# Patient Record
Sex: Female | Born: 1940 | Race: White | Hispanic: No | Smoking: Never smoker
Health system: Southern US, Community
[De-identification: ages and names within clinical notes are randomized; demographics above are authoritative.]

## PROBLEM LIST (undated history)

## (undated) DIAGNOSIS — I499 Cardiac arrhythmia, unspecified: Secondary | ICD-10-CM

## (undated) DIAGNOSIS — K219 Gastro-esophageal reflux disease without esophagitis: Secondary | ICD-10-CM

## (undated) DIAGNOSIS — H269 Unspecified cataract: Secondary | ICD-10-CM

## (undated) DIAGNOSIS — Z860100 Personal history of colon polyps, unspecified: Secondary | ICD-10-CM

## (undated) DIAGNOSIS — T7840XA Allergy, unspecified, initial encounter: Secondary | ICD-10-CM

## (undated) DIAGNOSIS — F32A Depression, unspecified: Secondary | ICD-10-CM

## (undated) DIAGNOSIS — F419 Anxiety disorder, unspecified: Secondary | ICD-10-CM

## (undated) DIAGNOSIS — I10 Essential (primary) hypertension: Secondary | ICD-10-CM

## (undated) DIAGNOSIS — E785 Hyperlipidemia, unspecified: Secondary | ICD-10-CM

## (undated) HISTORY — DX: Unspecified cataract: H26.9

## (undated) HISTORY — PX: BARTHOLIN GLAND CYST EXCISION: SHX565

## (undated) HISTORY — DX: Personal history of colonic polyps: Z86.010

## (undated) HISTORY — DX: Essential (primary) hypertension: I10

## (undated) HISTORY — DX: Hyperlipidemia, unspecified: E78.5

## (undated) HISTORY — DX: Personal history of colon polyps, unspecified: Z86.0100

## (undated) HISTORY — PX: CHOLECYSTECTOMY: SHX55

## (undated) HISTORY — DX: Allergy, unspecified, initial encounter: T78.40XA

## (undated) HISTORY — DX: Gastro-esophageal reflux disease without esophagitis: K21.9

## (undated) HISTORY — DX: Anxiety disorder, unspecified: F41.9

## (undated) HISTORY — DX: Cardiac arrhythmia, unspecified: I49.9

## (undated) HISTORY — DX: Depression, unspecified: F32.A

---
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
## 1962-02-12 HISTORY — PX: NASAL POLYP SURGERY: SHX186

## 2004-06-09 ENCOUNTER — Emergency Department: Payer: Self-pay | Admitting: Emergency Medicine

## 2005-03-14 ENCOUNTER — Ambulatory Visit: Payer: Self-pay | Admitting: Internal Medicine

## 2006-04-01 ENCOUNTER — Ambulatory Visit: Payer: Self-pay | Admitting: Internal Medicine

## 2006-04-09 ENCOUNTER — Ambulatory Visit: Payer: Self-pay | Admitting: Internal Medicine

## 2006-11-04 ENCOUNTER — Ambulatory Visit: Payer: Self-pay | Admitting: Surgery

## 2007-04-22 ENCOUNTER — Ambulatory Visit: Payer: Self-pay | Admitting: Internal Medicine

## 2008-05-18 ENCOUNTER — Ambulatory Visit: Payer: Self-pay | Admitting: Internal Medicine

## 2008-11-02 ENCOUNTER — Ambulatory Visit: Payer: Self-pay | Admitting: Gastroenterology

## 2008-12-09 ENCOUNTER — Ambulatory Visit: Payer: Self-pay | Admitting: Internal Medicine

## 2009-09-20 ENCOUNTER — Ambulatory Visit: Payer: Self-pay | Admitting: Internal Medicine

## 2010-12-28 ENCOUNTER — Ambulatory Visit: Payer: Self-pay

## 2011-01-17 ENCOUNTER — Ambulatory Visit: Payer: Self-pay | Admitting: Internal Medicine

## 2011-08-13 LAB — HM MAMMOGRAPHY

## 2011-08-13 LAB — HM PAP SMEAR

## 2011-12-11 ENCOUNTER — Encounter: Payer: Self-pay | Admitting: Internal Medicine

## 2011-12-11 ENCOUNTER — Ambulatory Visit (INDEPENDENT_AMBULATORY_CARE_PROVIDER_SITE_OTHER): Payer: Medicare Other | Admitting: Internal Medicine

## 2011-12-11 ENCOUNTER — Telehealth: Payer: Self-pay | Admitting: Internal Medicine

## 2011-12-11 VITALS — BP 142/75 | HR 64 | Temp 98.2°F | Ht 64.0 in | Wt 190.0 lb

## 2011-12-11 DIAGNOSIS — I10 Essential (primary) hypertension: Secondary | ICD-10-CM

## 2011-12-11 MED ORDER — TELMISARTAN-HCTZ 80-12.5 MG PO TABS: 1.0000 | ORAL_TABLET | Freq: Every day | ORAL | Status: DC

## 2011-12-11 NOTE — Telephone Encounter (Signed)
Refill request for escitalopram 10 mg tablet Sig: take 1 tablet by mouth once a day

## 2011-12-11 NOTE — Patient Instructions (Addendum)
It was nice seeing you today.  I want you to monitor your blood pressure and send me some readings in several weeks.

## 2011-12-12 MED ORDER — ESCITALOPRAM OXALATE 10 MG PO TABS: 10.0000 mg | ORAL_TABLET | Freq: Every day | ORAL | Status: DC

## 2011-12-12 NOTE — Telephone Encounter (Signed)
Is it ok to refill? 

## 2011-12-12 NOTE — Telephone Encounter (Signed)
Notify pt i sent rx in today.  Let me know if problems.

## 2011-12-13 NOTE — Telephone Encounter (Signed)
Called patient and let her know.  

## 2011-12-17 ENCOUNTER — Encounter: Payer: Self-pay | Admitting: Internal Medicine

## 2011-12-17 DIAGNOSIS — I10 Essential (primary) hypertension: Secondary | ICD-10-CM | POA: Insufficient documentation

## 2011-12-17 NOTE — Assessment & Plan Note (Signed)
Blood pressure has been well controlled on Micardis/HCTZ.  BP is a little elevated today in the office.  Will have her spot check her pressure and record.  Send in readings over the next few weeks.  Adjust meds if necessary.

## 2011-12-17 NOTE — Progress Notes (Signed)
  Subjective:    Patient ID: Lori Guzman, female    DOB: Nov 18, 1940, 71 y.o.   MRN: [id number]  HPI 71 year old female with past history of hypertension who comes in today for a scheduled follow up.  She states she has been doing relatively well.  Feels she is handling stress relatively well.  Trying to stay active.  No chest pain or tightness with increased activity or exertion.  Bowels stable.  No abdominal pain or cramping.  Overall feels things are stable.   Past Medical History  Diagnosis Date  . Hypertension   . GERD (gastroesophageal reflux disease)     Review of Systems Patient denies any headache, lightheadedness or dizziness.  No sinus or allergy problems.   No chest pain, tightness or palpitations.  No increased shortness of breath, cough or congestion.  No increased acid reflux, dysphagia or odynophagis.   No nausea or vomiting.  No abdominal pain or cramping.  No bowel change, such as diarrhea, constipation, BRBPR or melana.  No urine change.        Objective:   Physical Exam Filed Vitals:   12/11/11 1613  BP: 142/75  Pulse: 64  Temp: 98.2 F (51.60 C)   71 year old female in no acute distress.   HEENT:  Nares - clear.  OP- without lesions or erythema.  NECK:  Supple, nontender.  No audible bruit.   HEART:  Appears to be regular. LUNGS:  Without crackles or wheezing audible.  Respirations even and unlabored.   RADIAL PULSE:  Equal bilaterally.  ABDOMEN:  Soft, nontender.  No audible abdominal bruit.   EXTREMITIES:  No increased edema to be present.                     Assessment & Plan:  INCREASED PSYCHOSOCIAL STRESSORS.  Doing well on the Lexapro.  Continue Lexapro 10mg  q day.  Follow.   FATIGUE.  Did report some, but overall appears to be doing well.  Will check cbc, met c and tsh with next labs.  GI.  Stomach and bowels appear to be doing better.  Follow.    HEALTH MAINTENANCE.  Will schedule her for a physical at next visit.  Obtain outside records for  review.  Keep on track with her mammograms, etc.

## 2012-01-25 ENCOUNTER — Telehealth: Payer: Self-pay | Admitting: Internal Medicine

## 2012-01-25 NOTE — Telephone Encounter (Signed)
Refill request for escitalopram 10 mg tablet Qty: 30 Sig: take 1 tablet by mouth once a day Patient has been seen

## 2012-01-25 NOTE — Telephone Encounter (Signed)
Per [facility] patient has 3 refills left for this script escitalopram 10 mg

## 2012-01-30 ENCOUNTER — Telehealth: Payer: Self-pay | Admitting: *Deleted

## 2012-01-30 NOTE — Telephone Encounter (Signed)
We can start filling.  Does she need it sent in now.  If so, ok to fill x 6.  Will need to know where to send rx

## 2012-01-30 NOTE — Telephone Encounter (Signed)
Called patient to let her know. Patient wants you to start filling her Micardis prescription

## 2012-01-30 NOTE — Telephone Encounter (Signed)
Call patient and let her know Bp readings are ok. Continue to monitor and send readings. Will give her another code for mychart.

## 2012-01-31 NOTE — Telephone Encounter (Signed)
Patient has enough for 1-2 weeks [facility] uses [facility] in [place].

## 2012-02-01 ENCOUNTER — Other Ambulatory Visit: Payer: Self-pay | Admitting: *Deleted

## 2012-02-01 MED ORDER — TELMISARTAN-HCTZ 80-12.5 MG PO TABS: 1.0000 | ORAL_TABLET | Freq: Every day | ORAL | Status: DC

## 2012-02-01 NOTE — Progress Notes (Signed)
Filled script for micardis 

## 2012-02-01 NOTE — Telephone Encounter (Signed)
Filled script for micardis

## 2012-02-29 ENCOUNTER — Telehealth: Payer: Self-pay | Admitting: Internal Medicine

## 2012-02-29 ENCOUNTER — Ambulatory Visit (INDEPENDENT_AMBULATORY_CARE_PROVIDER_SITE_OTHER): Payer: 59 | Admitting: Internal Medicine

## 2012-02-29 ENCOUNTER — Encounter: Payer: Self-pay | Admitting: Internal Medicine

## 2012-02-29 VITALS — BP 180/80 | HR 62 | Temp 98.2°F | Ht 64.0 in | Wt 189.8 lb

## 2012-02-29 DIAGNOSIS — Z139 Encounter for screening, unspecified: Secondary | ICD-10-CM

## 2012-02-29 DIAGNOSIS — I10 Essential (primary) hypertension: Secondary | ICD-10-CM

## 2012-02-29 DIAGNOSIS — Z23 Encounter for immunization: Secondary | ICD-10-CM

## 2012-02-29 LAB — BASIC METABOLIC PANEL
BUN: 19 mg/dL (ref 6–23)
CO2: 29 mEq/L (ref 19–32)
Chloride: 106 mEq/L (ref 96–112)
Glucose, Bld: 96 mg/dL (ref 70–99)
Potassium: 4.4 mEq/L (ref 3.5–5.3)

## 2012-02-29 MED ORDER — MOMETASONE FUROATE 50 MCG/ACT NA SUSP: 2.0000 | Freq: Every day | NASAL | Status: DC

## 2012-02-29 NOTE — Telephone Encounter (Signed)
They were going to give her what supply they had left and I thing they had 3 bottles.

## 2012-02-29 NOTE — Telephone Encounter (Signed)
Pt wanted to know if she could get a 90 day supply of the [employer]

## 2012-03-01 ENCOUNTER — Encounter: Payer: Self-pay | Admitting: Internal Medicine

## 2012-03-01 NOTE — Assessment & Plan Note (Signed)
Not taking the micardis/hctz as outlined.  Only on the micardis.  Called another pharmacy.  Pt to restart micardis/hctz 80/12.5 q day.  Follow.  Check metabolic panel.  Follow pressures.

## 2012-03-01 NOTE — Progress Notes (Signed)
  Subjective:    Patient ID: Lori Guzman, female    DOB: 11-14-1940, 72 y.o.   MRN: [id number]  HPI 72 year old female with past history of hypertension who comes in today for a scheduled follow up.  She states she has been doing relatively well.  Feels she is handling stress relatively well.  Trying to stay active.  No chest pain or tightness with increased activity or exertion.  Bowels stable.  Breathing stable.   No abdominal pain or cramping.  She has been out of her micardis/hctz for the last few weeks.  She states the pharmacy does not have it in stock.  She has been taking just micardis 80mg  q day from a previous rx.  Blood pressures have been averaging 130-140/70s.    Past Medical History  Diagnosis Date  . Hypertension   . GERD (gastroesophageal reflux disease)   . Allergy     reoccuring problems  . History of colon polyps     adenomatous    Current Outpatient Prescriptions on File Prior to Visit  Medication Sig Dispense Refill  . escitalopram (LEXAPRO) 10 MG tablet Take 1 tablet (10 mg total) by mouth daily.  30 tablet  5  . mometasone (NASONEX) 50 MCG/ACT nasal spray Place 2 sprays into the nose daily. As directed.  17 g  3  . telmisartan-hydrochlorothiazide (MICARDIS HCT) 80-12.5 MG per tablet Take 1 tablet by mouth daily.  30 tablet  6    Review of Systems Patient denies any headache, lightheadedness or dizziness.  No sinus or allergy problems.   No chest pain, tightness or palpitations.  No increased shortness of breath, cough or congestion.  No increased acid reflux, dysphagia or odynophagia.   No nausea or vomiting.  No abdominal pain or cramping.  No bowel change, such as diarrhea, constipation, BRBPR or melana.  No urine change.   Overall she feels she is doing well.      Objective:   Physical Exam  Filed Vitals:   02/29/12 1443  BP: 180/80  Pulse: 62  Temp: 98.2 F (36.8 C)   Blood pressure recheck:  26/78  72 year old female in no acute distress.     HEENT:  Nares - clear.  OP- without lesions or erythema.  NECK:  Supple, nontender.  No audible bruit.   HEART:  Appears to be regular. LUNGS:  Without crackles or wheezing audible.  Respirations even and unlabored.   RADIAL PULSE:  Equal bilaterally.  ABDOMEN:  Soft, nontender.  No audible abdominal bruit.   EXTREMITIES:  No increased edema to be present.                     Assessment & Plan:  INCREASED PSYCHOSOCIAL STRESSORS.  Doing well on the Lexapro.  Continue Lexapro 10mg  q day.  Follow.   GI.  Stomach and bowels appear to be doing better.  Follow.    HEALTH MAINTENANCE.  Will schedule her for a physical at next visit.  Keep on track with her mammograms, etc.  Schedule a mammogram.

## 2012-03-18 ENCOUNTER — Encounter: Payer: Self-pay | Admitting: Internal Medicine

## 2012-03-19 ENCOUNTER — Telehealth: Payer: Self-pay | Admitting: Internal Medicine

## 2012-03-19 NOTE — Telephone Encounter (Signed)
Pt was questioning when her mammogram - scheduled.  Let me know if I need to do anything.  Thanks.

## 2012-04-08 ENCOUNTER — Ambulatory Visit: Payer: Self-pay | Admitting: Internal Medicine

## 2012-04-09 ENCOUNTER — Encounter: Payer: Self-pay | Admitting: Internal Medicine

## 2012-04-24 ENCOUNTER — Encounter: Payer: Self-pay | Admitting: Internal Medicine

## 2012-06-03 ENCOUNTER — Telehealth: Payer: Self-pay | Admitting: Internal Medicine

## 2012-06-03 NOTE — Telephone Encounter (Signed)
Patient wants to know if she should come in before her physical to have her labs done.

## 2012-06-04 ENCOUNTER — Other Ambulatory Visit: Payer: Self-pay | Admitting: Internal Medicine

## 2012-06-04 DIAGNOSIS — I10 Essential (primary) hypertension: Secondary | ICD-10-CM

## 2012-06-04 DIAGNOSIS — Z13220 Encounter for screening for lipoid disorders: Secondary | ICD-10-CM

## 2012-06-04 NOTE — Progress Notes (Signed)
Orders placed for labs

## 2012-06-04 NOTE — Telephone Encounter (Signed)
Pt had wanted to get labs prior to her physical.  I have ordered labs.  Please schedule her for a fasting lab appt before her upcoming physical.  Thanks.

## 2012-06-05 NOTE — Telephone Encounter (Signed)
Pt decided to wait to do her labs

## 2012-06-05 NOTE — Telephone Encounter (Signed)
Left message for pt to call office

## 2012-06-09 ENCOUNTER — Other Ambulatory Visit (HOSPITAL_COMMUNITY)
Admission: RE | Admit: 2012-06-09 | Discharge: 2012-06-09 | Disposition: A | Discharge status: Home / Self Care | Payer: Medicare Other | Source: Ambulatory Visit | Attending: Internal Medicine | Admitting: Internal Medicine

## 2012-06-09 ENCOUNTER — Encounter: Payer: Self-pay | Admitting: Internal Medicine

## 2012-06-09 ENCOUNTER — Ambulatory Visit (INDEPENDENT_AMBULATORY_CARE_PROVIDER_SITE_OTHER): Payer: 59 | Admitting: Internal Medicine

## 2012-06-09 VITALS — BP 142/76 | HR 63 | Temp 98.0°F | Ht 64.5 in | Wt 189.0 lb

## 2012-06-09 DIAGNOSIS — I10 Essential (primary) hypertension: Secondary | ICD-10-CM

## 2012-06-09 DIAGNOSIS — K219 Gastro-esophageal reflux disease without esophagitis: Secondary | ICD-10-CM | POA: Insufficient documentation

## 2012-06-09 DIAGNOSIS — Z23 Encounter for immunization: Secondary | ICD-10-CM

## 2012-06-09 DIAGNOSIS — Z1151 Encounter for screening for human papillomavirus (HPV): Secondary | ICD-10-CM | POA: Insufficient documentation

## 2012-06-09 DIAGNOSIS — Z13220 Encounter for screening for lipoid disorders: Secondary | ICD-10-CM

## 2012-06-09 DIAGNOSIS — Z86010 Personal history of colonic polyps: Secondary | ICD-10-CM

## 2012-06-09 DIAGNOSIS — Z01419 Encounter for gynecological examination (general) (routine) without abnormal findings: Secondary | ICD-10-CM | POA: Insufficient documentation

## 2012-06-09 DIAGNOSIS — Z124 Encounter for screening for malignant neoplasm of cervix: Secondary | ICD-10-CM

## 2012-06-09 LAB — COMPREHENSIVE METABOLIC PANEL
AST: 19 U/L (ref 0–37)
Albumin: 4.3 g/dL (ref 3.5–5.2)
Alkaline Phosphatase: 51 U/L (ref 39–117)
BUN: 14 mg/dL (ref 6–23)
Potassium: 4.1 mEq/L (ref 3.5–5.1)
Total Bilirubin: 1.8 mg/dL — ABNORMAL HIGH (ref 0.3–1.2)

## 2012-06-09 LAB — LIPID PANEL
LDL Cholesterol: 106 mg/dL — ABNORMAL HIGH (ref 0–99)
Total CHOL/HDL Ratio: 3
VLDL: 12.4 mg/dL (ref 0.0–40.0)

## 2012-06-09 LAB — CBC WITH DIFFERENTIAL/PLATELET
Basophils Relative: 0.7 % (ref 0.0–3.0)
Eosinophils Absolute: 0.2 10*3/uL (ref 0.0–0.7)
Eosinophils Relative: 3.1 % (ref 0.0–5.0)
HCT: 37.5 % (ref 36.0–46.0)
Hemoglobin: 12.8 g/dL (ref 12.0–15.0)
Lymphs Abs: 2 10*3/uL (ref 0.7–4.0)
MCHC: 34.1 g/dL (ref 30.0–36.0)
MCV: 91.9 fl (ref 78.0–100.0)
Monocytes Absolute: 0.5 10*3/uL (ref 0.1–1.0)
Neutro Abs: 3 10*3/uL (ref 1.4–7.7)
Neutrophils Relative %: 53.1 % (ref 43.0–77.0)
RBC: 4.08 Mil/uL (ref 3.87–5.11)
WBC: 5.7 10*3/uL (ref 4.5–10.5)

## 2012-06-09 NOTE — Progress Notes (Signed)
  Subjective:    Patient ID: Lori Guzman, female    DOB: 1941-01-24, 72 y.o.   MRN: [id number]  HPI 72 year old female with past history of hypertension who comes in today to follow up on this issue as well as for a complete physical exam.  She states she has been doing relatively well.  Feels she is handling stress relatively well.  Trying to stay active.  No chest pain or tightness with increased activity or exertion.  Bowels stable.  Breathing stable.   No abdominal pain or cramping.      Past Medical History  Diagnosis Date  . Hypertension   . GERD (gastroesophageal reflux disease)   . Allergy     reoccuring problems  . History of colon polyps     adenomatous    Current Outpatient Prescriptions on File Prior to Visit  Medication Sig Dispense Refill  . aspirin EC 81 MG tablet Take 81 mg by mouth daily.      Marland Kitchen escitalopram (LEXAPRO) 10 MG tablet Take 1 tablet (10 mg total) by mouth daily.  30 tablet  5  . mometasone (NASONEX) 50 MCG/ACT nasal spray Place 2 sprays into the nose daily. As directed.  17 g  3  . telmisartan-hydrochlorothiazide (MICARDIS HCT) 80-12.5 MG per tablet Take 1 tablet by mouth daily.  30 tablet  6   No current facility-administered medications on file prior to visit.    Review of Systems Patient denies any headache, lightheadedness or dizziness.  No sinus or allergy problems.   No chest pain, tightness or palpitations.  No increased shortness of breath, cough or congestion.  No increased acid reflux, dysphagia or odynophagia.   No nausea or vomiting.  No abdominal pain or cramping.  No bowel change, such as diarrhea, constipation, BRBPR or melana.  No urine change.   Overall she feels she is doing well.  Increased stress.  They are selling their house.  Increased stress related to this.  Feels she is handling things relatively well.      Objective:   Physical Exam  Filed Vitals:   06/09/12 1036  BP: 142/76  Pulse: 63  Temp: 98 F (36.7 C)   Blood  pressure recheck:  78/65  72 year old female in no acute distress.   HEENT:  Nares- clear.  Oropharynx - without lesions. NECK:  Supple.  Nontender.  No audible bruit.  HEART:  Appears to be regular. LUNGS:  No crackles or wheezing audible.  Respirations even and unlabored.  RADIAL PULSE:  Equal bilaterally.    BREASTS:  No nipple discharge or nipple retraction present.  Could not appreciate any distinct nodules or axillary adenopathy.  ABDOMEN:  Soft, nontender.  Bowel sounds present and normal.  No audible abdominal bruit.  GU:  Normal external genitalia.  Vaginal vault without lesions.  Cervix identified.  Pap performed. Could not appreciate any adnexal masses or tenderness.   RECTAL:  Heme negative.   EXTREMITIES:  No increased edema present.  DP pulses palpable and equal bilaterally.             Assessment & Plan:  INCREASED PSYCHOSOCIAL STRESSORS.  Doing well on the Lexapro.  Continue Lexapro 10mg  q day.  Follow.   GI.  Stomach and bowels appear to be doing better.  Follow.    HEALTH MAINTENANCE.  Physical today.  Pelvic and pap performed.  Mammogram 04/08/12 - [facility]adsI.  Need results of colonoscopy.   Pneumovax given today.

## 2012-06-09 NOTE — Assessment & Plan Note (Signed)
History of adenomatous polyps.  Obtain records of last colonoscopy.  Continue follow up with GI.

## 2012-06-09 NOTE — Assessment & Plan Note (Signed)
Blood pressure is under good control on outside readings.  (110-120s/70-80).  A little elevated here today.  Have her to continue to spot check her pressure and record.  Notify me if elevation.  Check metabolic panel.

## 2012-06-09 NOTE — Assessment & Plan Note (Signed)
Symptoms under control.  Follow.   

## 2012-06-10 ENCOUNTER — Other Ambulatory Visit: Payer: Self-pay | Admitting: Internal Medicine

## 2012-06-10 ENCOUNTER — Ambulatory Visit: Payer: Medicare Other

## 2012-06-10 DIAGNOSIS — R17 Unspecified jaundice: Secondary | ICD-10-CM

## 2012-06-10 NOTE — Progress Notes (Signed)
Order placed for direct bili 

## 2012-06-11 ENCOUNTER — Other Ambulatory Visit: Payer: Self-pay | Admitting: Internal Medicine

## 2012-06-11 ENCOUNTER — Encounter: Payer: Self-pay | Admitting: Internal Medicine

## 2012-06-11 DIAGNOSIS — R739 Hyperglycemia, unspecified: Secondary | ICD-10-CM

## 2012-06-11 NOTE — Progress Notes (Signed)
Orders placed for f/u labs.  

## 2012-07-15 ENCOUNTER — Other Ambulatory Visit (INDEPENDENT_AMBULATORY_CARE_PROVIDER_SITE_OTHER): Payer: Medicare Other

## 2012-07-15 DIAGNOSIS — R17 Unspecified jaundice: Secondary | ICD-10-CM

## 2012-07-15 DIAGNOSIS — R7309 Other abnormal glucose: Secondary | ICD-10-CM

## 2012-07-15 DIAGNOSIS — R739 Hyperglycemia, unspecified: Secondary | ICD-10-CM

## 2012-07-15 DIAGNOSIS — I10 Essential (primary) hypertension: Secondary | ICD-10-CM

## 2012-07-15 LAB — HEPATIC FUNCTION PANEL
AST: 16 U/L (ref 0–37)
Albumin: 4 g/dL (ref 3.5–5.2)
Total Bilirubin: 1.5 mg/dL — ABNORMAL HIGH (ref 0.3–1.2)

## 2012-07-15 LAB — HEMOGLOBIN A1C: Hgb A1c MFr Bld: 6.4 % (ref 4.6–6.5)

## 2012-07-16 ENCOUNTER — Encounter: Payer: Self-pay | Admitting: Internal Medicine

## 2012-07-16 ENCOUNTER — Telehealth: Payer: Self-pay | Admitting: Internal Medicine

## 2012-07-16 NOTE — Telephone Encounter (Signed)
Pt notified of labs via my chart and the need for a f/u appt to discuss.  Please schedule her a follow up appt in the next couple of weeks (or 3 weeks if necessary).   Thanks.

## 2012-07-17 NOTE — Telephone Encounter (Signed)
Sent my chart message letting pt know when appointment is

## 2012-07-18 ENCOUNTER — Encounter: Payer: Self-pay | Admitting: Internal Medicine

## 2012-07-23 ENCOUNTER — Other Ambulatory Visit: Payer: Self-pay | Admitting: Internal Medicine

## 2012-08-12 ENCOUNTER — Ambulatory Visit (INDEPENDENT_AMBULATORY_CARE_PROVIDER_SITE_OTHER): Payer: Medicare Other | Admitting: Internal Medicine

## 2012-08-12 ENCOUNTER — Encounter: Payer: Self-pay | Admitting: Internal Medicine

## 2012-08-12 VITALS — BP 120/60 | HR 70 | Temp 97.8°F | Ht 64.5 in | Wt 187.8 lb

## 2012-08-12 DIAGNOSIS — R739 Hyperglycemia, unspecified: Secondary | ICD-10-CM

## 2012-08-12 DIAGNOSIS — Z86010 Personal history of colonic polyps: Secondary | ICD-10-CM

## 2012-08-12 DIAGNOSIS — R7309 Other abnormal glucose: Secondary | ICD-10-CM

## 2012-08-12 DIAGNOSIS — K219 Gastro-esophageal reflux disease without esophagitis: Secondary | ICD-10-CM

## 2012-08-12 DIAGNOSIS — I10 Essential (primary) hypertension: Secondary | ICD-10-CM

## 2012-08-15 ENCOUNTER — Encounter: Payer: Self-pay | Admitting: Internal Medicine

## 2012-08-15 DIAGNOSIS — E119 Type 2 diabetes mellitus without complications: Secondary | ICD-10-CM | POA: Insufficient documentation

## 2012-08-15 NOTE — Assessment & Plan Note (Addendum)
A1c just checked - 6.4.  Low carb diet and exercise.  Follow.  She was given a glucometer.  Instructed on proper way to check her sugar.

## 2012-08-15 NOTE — Progress Notes (Signed)
  Subjective:    Patient ID: Lori Guzman, female    DOB: 1940/05/08, 72 y.o.   MRN: [id number]  HPI 72 year old female with past history of hypertension who comes in today for a scheduled follow up.  She states she has been doing relatively well.  Feels she is handling stress relatively well.  Trying to stay active.  No chest pain or tightness with increased activity or exertion.  Bowels stable.  Breathing stable.   No abdominal pain or cramping.  Here to discuss her recent labs.  Her a1c was slightly elevated - 6.4.  We discussed diet and exercise.  Discussed low carb diet.      Past Medical History  Diagnosis Date  . Hypertension   . GERD (gastroesophageal reflux disease)   . Allergy     reoccuring problems  . History of colon polyps     adenomatous    Current Outpatient Prescriptions on File Prior to Visit  Medication Sig Dispense Refill  . aspirin EC 81 MG tablet Take 81 mg by mouth daily.      Marland Kitchen escitalopram (LEXAPRO) 10 MG tablet TAKE 1 TABLET (10 MG TOTAL) BY MOUTH DAILY.  30 tablet  2  . mometasone (NASONEX) 50 MCG/ACT nasal spray Place 2 sprays into the nose daily. As directed.  17 g  3  . telmisartan-hydrochlorothiazide (MICARDIS HCT) 80-12.5 MG per tablet Take 1 tablet by mouth daily.  30 tablet  6   No current facility-administered medications on file prior to visit.    Review of Systems Patient denies any headache, lightheadedness or dizziness.  No sinus or allergy problems.   No chest pain, tightness or palpitations.  No increased shortness of breath, cough or congestion.  No increased acid reflux, dysphagia or odynophagia.   No nausea or vomiting.  No abdominal pain or cramping.  No bowel change, such as diarrhea, constipation, BRBPR or melana.  No urine change.   Overall she feels she is doing well.  Increased stress.  Feels she is handling things relatively well. Discussed diet adjustment - low carb diet.  Discussed exercise.        Objective:   Physical  Exam  Filed Vitals:   08/12/12 0952  BP: 120/60  Pulse: 70  Temp: 97.8 F (30.3 C)   72 year old female in no acute distress.   HEENT:  Nares- clear.  Oropharynx - without lesions. NECK:  Supple.  Nontender.  No audible bruit.  HEART:  Appears to be regular. LUNGS:  No crackles or wheezing audible.  Respirations even and unlabored.  RADIAL PULSE:  Equal bilaterally.  ABDOMEN:  Soft, nontender.  Bowel sounds present and normal.  No audible abdominal bruit.  EXTREMITIES:  No increased edema present.  DP pulses palpable and equal bilaterally.             Assessment & Plan:  INCREASED PSYCHOSOCIAL STRESSORS.  Doing well on the Lexapro.  Continue Lexapro 10mg  q day.  Follow.   GI.  Stomach and bowels appear to be doing better.  Follow.    HEALTH MAINTENANCE.  Physical 06/09/12.   Mammogram 04/08/12 - BiradsI.  Colonoscopy 11/02/08 - diverticulosis.  Otherwise normal.  Recommend f/u colonoscopy in five years.

## 2012-08-15 NOTE — Assessment & Plan Note (Signed)
Blood pressure - good control.  Follow metabolic panel.  Same medication regimen.

## 2012-08-15 NOTE — Assessment & Plan Note (Signed)
History of adenomatous polyps.  Colonoscopy 11/02/08 - diverticulosis otherwise normal.  Recommend f/u colonoscopy in five years.    

## 2012-08-15 NOTE — Assessment & Plan Note (Signed)
Symptoms under control.  Follow.   

## 2012-09-12 ENCOUNTER — Other Ambulatory Visit: Payer: Self-pay | Admitting: *Deleted

## 2012-09-12 MED ORDER — TELMISARTAN-HCTZ 80-12.5 MG PO TABS: 1.0000 | ORAL_TABLET | Freq: Every day | ORAL | Status: DC

## 2012-09-12 MED ORDER — ESCITALOPRAM OXALATE 10 MG PO TABS: ORAL_TABLET | ORAL | Status: DC

## 2012-10-10 ENCOUNTER — Ambulatory Visit (INDEPENDENT_AMBULATORY_CARE_PROVIDER_SITE_OTHER): Payer: Medicare Other | Admitting: Internal Medicine

## 2012-10-10 ENCOUNTER — Encounter: Payer: Self-pay | Admitting: Internal Medicine

## 2012-10-10 VITALS — BP 130/60 | HR 66 | Temp 98.5°F | Ht 64.5 in | Wt 186.2 lb

## 2012-10-10 DIAGNOSIS — Z86010 Personal history of colonic polyps: Secondary | ICD-10-CM

## 2012-10-10 DIAGNOSIS — R17 Unspecified jaundice: Secondary | ICD-10-CM

## 2012-10-10 DIAGNOSIS — R739 Hyperglycemia, unspecified: Secondary | ICD-10-CM

## 2012-10-10 DIAGNOSIS — K219 Gastro-esophageal reflux disease without esophagitis: Secondary | ICD-10-CM

## 2012-10-10 DIAGNOSIS — I10 Essential (primary) hypertension: Secondary | ICD-10-CM

## 2012-10-10 DIAGNOSIS — R7309 Other abnormal glucose: Secondary | ICD-10-CM

## 2012-10-10 NOTE — Assessment & Plan Note (Addendum)
A1c last checked - 6.4.  Low carb diet and exercise.  Sugars as outlined.  Follow.

## 2012-10-10 NOTE — Assessment & Plan Note (Signed)
Symptoms under control.  Follow.   

## 2012-10-10 NOTE — Assessment & Plan Note (Addendum)
Blood pressure - good control.  Follow metabolic panel.  Same medication regimen.  Recheck by me today 142/78.   Follow.

## 2012-10-10 NOTE — Progress Notes (Signed)
  Subjective:    Patient ID: Lori Guzman, female    DOB: [birth year], 72 y.o.   MRN: [id number]  HPI 72 year old female with past history of hypertension who comes in today for a scheduled follow up.  She states she has been doing relatively well.  Feels she is handling stress relatively well.  Trying to stay active.  No chest pain or tightness with increased activity or exertion.  Bowels stable.  Breathing stable.   No abdominal pain or cramping.  Has been watching her diet and sugars recently.  Most sugars recently have been averaging 102-120s.      Past Medical History  Diagnosis Date  . Hypertension   . GERD (gastroesophageal reflux disease)   . Allergy     reoccuring problems  . History of colon polyps     adenomatous    Current Outpatient Prescriptions on File Prior to Visit  Medication Sig Dispense Refill  . aspirin EC 81 MG tablet Take 81 mg by mouth daily.      Marland Kitchen escitalopram (LEXAPRO) 10 MG tablet TAKE 1 TABLET (10 MG TOTAL) BY MOUTH DAILY.  30 tablet  2  . mometasone (NASONEX) 50 MCG/ACT nasal spray Place 2 sprays into the nose daily. As directed.  17 g  3  . telmisartan-hydrochlorothiazide (MICARDIS HCT) 80-12.5 MG per tablet Take 1 tablet by mouth daily.  30 tablet  6   No current facility-administered medications on file prior to visit.    Review of Systems Patient denies any headache, lightheadedness or dizziness.  No sinus or allergy problems.   No chest pain, tightness or palpitations.  No increased shortness of breath, cough or congestion.  No increased acid reflux, dysphagia or odynophagia.   No nausea or vomiting.  No abdominal pain or cramping.  No bowel change, such as diarrhea, constipation, BRBPR or melana.  No urine change.   Overall she feels she is doing well.  Increased stress.  Feels she is handling things relatively well.  Trying to adjust her diet and exercise.       Objective:   Physical Exam  Filed Vitals:   10/10/12 0908  BP: 130/60  Pulse: 66   Temp: 98.5 F (35.45 C)   72 year old female in no acute distress.   HEENT:  Nares- clear.  Oropharynx - without lesions. NECK:  Supple.  Nontender.  No audible bruit.  HEART:  Appears to be regular. LUNGS:  No crackles or wheezing audible.  Respirations even and unlabored.  RADIAL PULSE:  Equal bilaterally.  ABDOMEN:  Soft, nontender.  Bowel sounds present and normal.  No audible abdominal bruit.  EXTREMITIES:  No increased edema present.  DP pulses palpable and equal bilaterally.             Assessment & Plan:  INCREASED PSYCHOSOCIAL STRESSORS.  Doing well on the Lexapro.  Continue Lexapro 10mg  q day.  Follow.   GI.  Stomach and bowels appear to be doing better.  Follow.    HEALTH MAINTENANCE.  Physical 06/09/12.   Mammogram 04/08/12 - BiradsI.  Colonoscopy 11/02/08 - diverticulosis.  Otherwise normal.  Recommend f/u colonoscopy in five years.

## 2012-10-10 NOTE — Assessment & Plan Note (Signed)
History of adenomatous polyps.  Colonoscopy 11/02/08 - diverticulosis otherwise normal.  Recommend f/u colonoscopy in five years.

## 2012-10-13 ENCOUNTER — Encounter: Payer: Self-pay | Admitting: Internal Medicine

## 2012-12-18 ENCOUNTER — Other Ambulatory Visit: Payer: Self-pay

## 2013-01-15 ENCOUNTER — Other Ambulatory Visit (INDEPENDENT_AMBULATORY_CARE_PROVIDER_SITE_OTHER): Payer: Medicare Other

## 2013-01-15 DIAGNOSIS — I10 Essential (primary) hypertension: Secondary | ICD-10-CM

## 2013-01-15 DIAGNOSIS — R739 Hyperglycemia, unspecified: Secondary | ICD-10-CM

## 2013-01-15 DIAGNOSIS — R17 Unspecified jaundice: Secondary | ICD-10-CM

## 2013-01-15 DIAGNOSIS — R7309 Other abnormal glucose: Secondary | ICD-10-CM

## 2013-01-15 LAB — HEPATIC FUNCTION PANEL
Bilirubin, Direct: 0.2 mg/dL (ref 0.0–0.3)
Total Bilirubin: 1.3 mg/dL — ABNORMAL HIGH (ref 0.3–1.2)
Total Protein: 6.7 g/dL (ref 6.0–8.3)

## 2013-01-15 LAB — BASIC METABOLIC PANEL
CO2: 31 mEq/L (ref 19–32)
Calcium: 9.2 mg/dL (ref 8.4–10.5)
Creatinine, Ser: 0.8 mg/dL (ref 0.4–1.2)

## 2013-01-16 ENCOUNTER — Encounter: Payer: Self-pay | Admitting: Internal Medicine

## 2013-01-17 ENCOUNTER — Other Ambulatory Visit: Payer: Self-pay | Admitting: Internal Medicine

## 2013-01-19 ENCOUNTER — Encounter: Payer: Self-pay | Admitting: Internal Medicine

## 2013-01-19 ENCOUNTER — Ambulatory Visit (INDEPENDENT_AMBULATORY_CARE_PROVIDER_SITE_OTHER): Payer: Medicare Other | Admitting: Internal Medicine

## 2013-01-19 VITALS — BP 138/62 | HR 68 | Temp 98.2°F | Ht 64.5 in | Wt 187.5 lb

## 2013-01-19 DIAGNOSIS — E119 Type 2 diabetes mellitus without complications: Secondary | ICD-10-CM

## 2013-01-19 DIAGNOSIS — K219 Gastro-esophageal reflux disease without esophagitis: Secondary | ICD-10-CM

## 2013-01-19 DIAGNOSIS — M79604 Pain in right leg: Secondary | ICD-10-CM

## 2013-01-19 DIAGNOSIS — Z86010 Personal history of colonic polyps: Secondary | ICD-10-CM

## 2013-01-19 DIAGNOSIS — I10 Essential (primary) hypertension: Secondary | ICD-10-CM

## 2013-01-19 DIAGNOSIS — M79609 Pain in unspecified limb: Secondary | ICD-10-CM

## 2013-01-19 DIAGNOSIS — Z23 Encounter for immunization: Secondary | ICD-10-CM

## 2013-01-19 NOTE — Progress Notes (Signed)
Pre-visit discussion using our clinic review tool. No additional management support is needed unless otherwise documented below in the visit note.  

## 2013-01-19 NOTE — Telephone Encounter (Signed)
Pt has appointment today with Dr. Lorin Picket, will discuss results at visit.

## 2013-01-21 ENCOUNTER — Encounter: Payer: Self-pay | Admitting: Internal Medicine

## 2013-01-21 DIAGNOSIS — F439 Reaction to severe stress, unspecified: Secondary | ICD-10-CM | POA: Insufficient documentation

## 2013-01-21 NOTE — Assessment & Plan Note (Signed)
History of adenomatous polyps.  Colonoscopy 11/02/08 - diverticulosis otherwise normal.  Recommend f/u colonoscopy in five years.

## 2013-01-21 NOTE — Assessment & Plan Note (Signed)
Pain as outlined.  No pain with straight leg raise.  Discussed stretches.  Tylenol helps.  She desires no further w/up or evaluation at this time.  Follow.

## 2013-01-21 NOTE — Assessment & Plan Note (Signed)
Symptoms under control.  Follow.   

## 2013-01-21 NOTE — Assessment & Plan Note (Signed)
A1c just checked 6.5.  Low carb diet and exercise.  Keep up to date with eye checks.

## 2013-01-21 NOTE — Progress Notes (Signed)
Subjective:    Patient ID: Lori Guzman, female    DOB: 1941-02-01, 72 y.o.   MRN: [id number]  HPI 73 year old female with past history of hypertension who comes in today for a scheduled follow up.  She states she has been doing relatively well.  Feels she is handling stress relatively well.  Trying to stay active.  No chest pain or tightness with increased activity or exertion.  Bowels stable.  Breathing stable.   No abdominal pain or cramping.  Trying to watch her diet.  Discussed low carb diet.  She has had problems with pain down her right leg (occasionally into her calf).  started several weeks ago.  States that four years ago, she had a previous ruptured disc.  Seeing Dr Imogene Burn (for accupuncture).  Tylenol helps.  Discussed stretching.        Past Medical History  Diagnosis Date  . Hypertension   . GERD (gastroesophageal reflux disease)   . Allergy     reoccuring problems  . History of colon polyps     adenomatous    Current Outpatient Prescriptions on File Prior to Visit  Medication Sig Dispense Refill  . mometasone (NASONEX) 50 MCG/ACT nasal spray Place 2 sprays into the nose daily. As directed.  17 g  3  . telmisartan-hydrochlorothiazide (MICARDIS HCT) 80-12.5 MG per tablet Take 1 tablet by mouth daily.  30 tablet  6  . aspirin EC 81 MG tablet Take 81 mg by mouth daily.      . Blood Glucose Monitoring Suppl (CONTOUR NEXT EZ MONITOR) W/DEVICE KIT by Does not apply route.      Marland Kitchen escitalopram (LEXAPRO) 10 MG tablet TAKE 1 TABLET BY MOUTH EVERY DAY  30 tablet  2   No current facility-administered medications on file prior to visit.    Review of Systems Patient denies any headache, lightheadedness or dizziness.  No sinus or allergy problems.   No chest pain, tightness or palpitations.  No increased shortness of breath, cough or congestion.  No increased acid reflux, dysphagia or odynophagia.   No nausea or vomiting.  No abdominal pain or cramping.  No bowel change, such as  diarrhea, constipation, BRBPR or melana.  No urine change.   Overall she feels she is doing well.  Increased stress with husbands medical issues.  Feels she is handling things relatively well.  Trying to adjust her diet and exercise.  Right leg pain as outlined.      Objective:   Physical Exam  Filed Vitals:   01/19/13 1003  BP: 138/62  Pulse: 68  Temp: 98.2 F (36.8 C)   Blood pressure recheck:  75/64  72 year old female in no acute distress.   HEENT:  Nares- clear.  Oropharynx - without lesions. NECK:  Supple.  Nontender.  No audible bruit.  HEART:  Appears to be regular. LUNGS:  No crackles or wheezing audible.  Respirations even and unlabored.  RADIAL PULSE:  Equal bilaterally.  ABDOMEN:  Soft, nontender.  Bowel sounds present and normal.  No audible abdominal bruit.  EXTREMITIES:  No increased edema present.  DP pulses palpable and equal bilaterally.  FEET:  Without lesions.             Assessment & Plan:  INCREASED PSYCHOSOCIAL STRESSORS.  Doing well on the Lexapro.  Continue Lexapro 10mg  q day.  Follow.   GI.  Stomach and bowels appear to be doing better.  Follow.    HEALTH MAINTENANCE.  Physical  06/09/12.   Mammogram 04/08/12 - [facility]adsI.  Colonoscopy 11/02/08 - diverticulosis.  Otherwise normal.  Recommend f/u colonoscopy in five years.

## 2013-01-21 NOTE — Assessment & Plan Note (Signed)
Blood pressure - good control.  Follow metabolic panel.  Same medication regimen.  Follow.   

## 2013-04-30 ENCOUNTER — Other Ambulatory Visit: Payer: Self-pay | Admitting: Internal Medicine

## 2013-05-06 ENCOUNTER — Other Ambulatory Visit: Payer: Self-pay | Admitting: Internal Medicine

## 2013-05-12 ENCOUNTER — Telehealth: Payer: Self-pay | Admitting: Internal Medicine

## 2013-05-12 NOTE — Telephone Encounter (Signed)
Pt called to rs CPE 4/6.  Did not realize it was made so close to [place].  Appt rs to 6/1.  Pt asking if she can be seen any sooner for CPE, does not want to wait that long if at all possible.  States she will be out of town 4/6 and cannot make the previously scheduled appt.  Please advise.

## 2013-05-13 NOTE — Telephone Encounter (Signed)
If we do not have a meeting on 06/09/13 (8:00), I can put her in for a physical at that time.  We usually do not have meetings on Tuesday morning.  Please confirm with Larene Beach.  If no meeting, ok to schedule.  If meeting, can hold for cancellation.

## 2013-05-18 ENCOUNTER — Encounter: Payer: Medicare Other | Admitting: Internal Medicine

## 2013-05-26 NOTE — Telephone Encounter (Signed)
ok 

## 2013-05-26 NOTE — Telephone Encounter (Signed)
Spoke with pt she stated she was going out of town.  i put her on wait list

## 2013-05-26 NOTE — Telephone Encounter (Signed)
There is a meeting on 4/28  But there is an opening 4/17 @ 3 can i put her there?

## 2013-06-09 ENCOUNTER — Other Ambulatory Visit: Payer: Self-pay | Admitting: Internal Medicine

## 2013-06-19 ENCOUNTER — Telehealth: Payer: Self-pay | Admitting: Internal Medicine

## 2013-06-19 DIAGNOSIS — Z0000 Encounter for general adult medical examination without abnormal findings: Secondary | ICD-10-CM

## 2013-06-19 NOTE — Telephone Encounter (Signed)
Body check by the Dermatologist Roque Lias Phillip Heal. She would like to scheduled the same time as her husband.

## 2013-06-19 NOTE — Telephone Encounter (Signed)
Order placed for dermatology referral.  

## 2013-07-09 ENCOUNTER — Other Ambulatory Visit: Payer: Self-pay | Admitting: *Deleted

## 2013-07-09 MED ORDER — ESCITALOPRAM OXALATE 10 MG PO TABS: ORAL_TABLET | ORAL | Status: DC

## 2013-07-13 ENCOUNTER — Ambulatory Visit (INDEPENDENT_AMBULATORY_CARE_PROVIDER_SITE_OTHER): Payer: Medicare Other | Admitting: Internal Medicine

## 2013-07-13 ENCOUNTER — Encounter: Payer: Self-pay | Admitting: Internal Medicine

## 2013-07-13 VITALS — BP 130/60 | HR 65 | Temp 98.1°F | Ht 64.25 in | Wt 191.0 lb

## 2013-07-13 DIAGNOSIS — I10 Essential (primary) hypertension: Secondary | ICD-10-CM

## 2013-07-13 DIAGNOSIS — Z860100 Personal history of colon polyps, unspecified: Secondary | ICD-10-CM

## 2013-07-13 DIAGNOSIS — E119 Type 2 diabetes mellitus without complications: Secondary | ICD-10-CM

## 2013-07-13 DIAGNOSIS — K069 Disorder of gingiva and edentulous alveolar ridge, unspecified: Secondary | ICD-10-CM

## 2013-07-13 DIAGNOSIS — K219 Gastro-esophageal reflux disease without esophagitis: Secondary | ICD-10-CM

## 2013-07-13 DIAGNOSIS — F439 Reaction to severe stress, unspecified: Secondary | ICD-10-CM

## 2013-07-13 DIAGNOSIS — Z733 Stress, not elsewhere classified: Secondary | ICD-10-CM

## 2013-07-13 DIAGNOSIS — Z1239 Encounter for other screening for malignant neoplasm of breast: Secondary | ICD-10-CM

## 2013-07-13 DIAGNOSIS — K056 Periodontal disease, unspecified: Secondary | ICD-10-CM

## 2013-07-13 DIAGNOSIS — K068 Other specified disorders of gingiva and edentulous alveolar ridge: Secondary | ICD-10-CM

## 2013-07-13 MED ORDER — ESOMEPRAZOLE MAGNESIUM 40 MG PO CPDR: 40.0000 mg | DELAYED_RELEASE_CAPSULE | Freq: Every day | ORAL | Status: DC

## 2013-07-13 NOTE — Progress Notes (Signed)
Pre visit review using our clinic review tool, if applicable. No additional management support is needed unless otherwise documented below in the visit note. 

## 2013-07-19 ENCOUNTER — Encounter: Payer: Self-pay | Admitting: Internal Medicine

## 2013-07-19 DIAGNOSIS — K068 Other specified disorders of gingiva and edentulous alveolar ridge: Secondary | ICD-10-CM | POA: Insufficient documentation

## 2013-07-19 MED ORDER — ESOMEPRAZOLE MAGNESIUM 40 MG PO CPDR: 40.0000 mg | DELAYED_RELEASE_CAPSULE | Freq: Every day | ORAL | Status: DC

## 2013-07-19 NOTE — Assessment & Plan Note (Signed)
Symptoms under control.  Follow.   

## 2013-07-19 NOTE — Progress Notes (Signed)
Subjective:    Patient ID: Lori Guzman, female    DOB: 1940-10-19, 73 y.o.   MRN: [id number]  HPI 73 year old female with past history of hypertension who comes in today to follow up on this issue as well as for a complete physical exam.  She states she has been doing relatively well.  Feels she is handling stress relatively well.  Trying to stay active.  No chest pain or tightness with increased activity or exertion.  Bowels stable.  Breathing stable.   No abdominal pain or cramping.  Trying to watch her diet.  States blood pressure has been averaging 116/56-60.  (812 highest systolic reading).  Planning to have a biopsy on her left upper gum (Dr Kathyrn Sheriff - 07/24/13).         Past Medical History  Diagnosis Date  . Hypertension   . GERD (gastroesophageal reflux disease)   . Allergy     reoccuring problems  . History of colon polyps     adenomatous    Current Outpatient Prescriptions on File Prior to Visit  Medication Sig Dispense Refill  . aspirin EC 81 MG tablet Take 81 mg by mouth daily.      . Blood Glucose Monitoring Suppl (CONTOUR NEXT EZ MONITOR) W/DEVICE KIT by Does not apply route.      Marland Kitchen escitalopram (LEXAPRO) 10 MG tablet TAKE 1 TABLET BY MOUTH EVERY DAY  30 tablet  2  . mometasone (NASONEX) 50 MCG/ACT nasal spray Place 2 sprays into the nose daily. As directed.  17 g  3  . telmisartan-hydrochlorothiazide (MICARDIS HCT) 80-12.5 MG per tablet TAKE 1 TABLET BY MOUTH DAILY.  30 tablet  5   No current facility-administered medications on file prior to visit.    Review of Systems Patient denies any headache, lightheadedness or dizziness.  No sinus or allergy problems.   No chest pain, tightness or palpitations.  No increased shortness of breath, cough or congestion.  No increased acid reflux, dysphagia or odynophagia.   No nausea or vomiting.  No abdominal pain or cramping.  No bowel change, such as diarrhea, constipation, BRBPR or melana.  No urine change.   Overall she feels  she is doing well.  Increased stress with husbands medical issues.  Feels she is handling things relatively well.  Trying to adjust her diet and exercise.  Blood pressures as outlined.       Objective:   Physical Exam  Filed Vitals:   07/13/13 1505  BP: 130/60  Pulse: 65  Temp: 98.1 F (54.45 C)   73 year old female in no acute distress.   HEENT:  Nares- clear.  Oropharynx - without lesions. NECK:  Supple.  Nontender.  No audible bruit.  HEART:  Appears to be regular. LUNGS:  No crackles or wheezing audible.  Respirations even and unlabored.  RADIAL PULSE:  Equal bilaterally.    BREASTS:  No nipple discharge or nipple retraction present.  Could not appreciate any distinct nodules or axillary adenopathy.  ABDOMEN:  Soft, nontender.  Bowel sounds present and normal.  No audible abdominal bruit.  GU:  Not performed.     EXTREMITIES:  No increased edema present.  DP pulses palpable and equal bilaterally.      FEET:  No lesions.           Assessment & Plan:  GI.  Stomach and bowels appear to be doing better.  Follow.    HEALTH MAINTENANCE.  Physical today.   Mammogram  04/08/12 - Birads I.  Overdue.  Colonoscopy 11/02/08 - diverticulosis.  Otherwise normal.  Recommend f/u colonoscopy in five years.  Due this year.    I spent 25 minutes with the patient and more than 50% of the time was spent in consultation regarding the above.

## 2013-07-19 NOTE — Assessment & Plan Note (Signed)
A1c last checked 6.5 (12/14).  Due f/u.   Low carb diet and exercise.  Keep up to date with eye checks.

## 2013-07-19 NOTE — Assessment & Plan Note (Addendum)
History of adenomatous polyps.  Colonoscopy 11/02/08 - diverticulosis otherwise normal.  Recommend f/u colonoscopy in five years.  Due this year.

## 2013-07-19 NOTE — Assessment & Plan Note (Signed)
Planning for biopsy 07/24/13 - Dr Kathyrn Sheriff.

## 2013-07-19 NOTE — Assessment & Plan Note (Signed)
Blood pressure - good control.  Follow metabolic panel.  Same medication regimen.  Follow.

## 2013-07-19 NOTE — Assessment & Plan Note (Signed)
Increased stress with her husband's medical issues.  Feels she is coping relatively well.  Follow.   

## 2013-07-23 ENCOUNTER — Other Ambulatory Visit (INDEPENDENT_AMBULATORY_CARE_PROVIDER_SITE_OTHER): Payer: Medicare Other

## 2013-07-23 DIAGNOSIS — E119 Type 2 diabetes mellitus without complications: Secondary | ICD-10-CM

## 2013-07-23 DIAGNOSIS — M79609 Pain in unspecified limb: Secondary | ICD-10-CM

## 2013-07-23 DIAGNOSIS — M79604 Pain in right leg: Secondary | ICD-10-CM

## 2013-07-23 LAB — CBC WITH DIFFERENTIAL/PLATELET
Basophils Absolute: 0 10*3/uL (ref 0.0–0.1)
Basophils Relative: 0.4 % (ref 0.0–3.0)
EOS ABS: 0.2 10*3/uL (ref 0.0–0.7)
Eosinophils Relative: 3.2 % (ref 0.0–5.0)
HCT: 37.3 % (ref 36.0–46.0)
HEMOGLOBIN: 12.7 g/dL (ref 12.0–15.0)
LYMPHS PCT: 35 % (ref 12.0–46.0)
Lymphs Abs: 1.7 10*3/uL (ref 0.7–4.0)
MCHC: 34 g/dL (ref 30.0–36.0)
MCV: 91.5 fl (ref 78.0–100.0)
MONOS PCT: 8.3 % (ref 3.0–12.0)
Monocytes Absolute: 0.4 10*3/uL (ref 0.1–1.0)
NEUTROS ABS: 2.6 10*3/uL (ref 1.4–7.7)
NEUTROS PCT: 53.1 % (ref 43.0–77.0)
PLATELETS: 239 10*3/uL (ref 150.0–400.0)
RBC: 4.08 Mil/uL (ref 3.87–5.11)
RDW: 13.1 % (ref 11.5–15.5)
WBC: 4.9 10*3/uL (ref 4.0–10.5)

## 2013-07-23 LAB — COMPREHENSIVE METABOLIC PANEL
ALK PHOS: 51 U/L (ref 39–117)
ALT: 13 U/L (ref 0–35)
AST: 17 U/L (ref 0–37)
Albumin: 3.9 g/dL (ref 3.5–5.2)
BUN: 14 mg/dL (ref 6–23)
CALCIUM: 9.3 mg/dL (ref 8.4–10.5)
CHLORIDE: 105 meq/L (ref 96–112)
CO2: 31 mEq/L (ref 19–32)
CREATININE: 0.8 mg/dL (ref 0.4–1.2)
GFR: 70.83 mL/min (ref >60.00)
Glucose, Bld: 111 mg/dL — ABNORMAL HIGH (ref 70–99)
Potassium: 4.2 mEq/L (ref 3.5–5.1)
Sodium: 140 mEq/L (ref 135–145)
Total Bilirubin: 1.6 mg/dL — ABNORMAL HIGH (ref 0.2–1.2)
Total Protein: 6.7 g/dL (ref 6.0–8.3)

## 2013-07-23 LAB — LIPID PANEL
CHOL/HDL RATIO: 3
Cholesterol: 174 mg/dL (ref 0–200)
HDL: 52.8 mg/dL (ref >39.00)
LDL CALC: 109 mg/dL — AB (ref 0–99)
NONHDL: 121.2
Triglycerides: 61 mg/dL (ref 0.0–149.0)
VLDL: 12.2 mg/dL (ref 0.0–40.0)

## 2013-07-23 LAB — TSH: TSH: 2.09 u[IU]/mL (ref 0.35–4.50)

## 2013-07-23 LAB — HEMOGLOBIN A1C: HEMOGLOBIN A1C: 6.4 % (ref 4.6–6.5)

## 2013-07-24 ENCOUNTER — Other Ambulatory Visit: Payer: Self-pay | Admitting: Internal Medicine

## 2013-07-24 NOTE — Progress Notes (Signed)
Order placed for direct bili lab

## 2013-07-27 ENCOUNTER — Telehealth: Payer: Self-pay | Admitting: Internal Medicine

## 2013-07-27 ENCOUNTER — Encounter: Payer: Self-pay | Admitting: Internal Medicine

## 2013-07-27 NOTE — Telephone Encounter (Signed)
Order placed for f/u liver panel.  Pt was notified of lab results.  Needs a non fasting lab appt in 1-2 weeks.  Please schedule and contact her with an appt date and time.

## 2013-07-27 NOTE — Telephone Encounter (Signed)
Lab appt scheduled. Sent mychart with appt date and time.

## 2013-07-28 ENCOUNTER — Telehealth: Payer: Self-pay | Admitting: Internal Medicine

## 2013-07-28 ENCOUNTER — Other Ambulatory Visit: Payer: Self-pay | Admitting: Internal Medicine

## 2013-07-28 DIAGNOSIS — R109 Unspecified abdominal pain: Secondary | ICD-10-CM

## 2013-07-28 NOTE — Telephone Encounter (Signed)
See phone note.  Pt was to let me know if agreeable for ultrasound.  Since agreeable, I have placed order for referral.  See her phone message for preferred dates and times.    Becky, please forward message to [facility] (referral coordinator).   (I was not sure of her last name Jimmye Norman?).   Thanks.

## 2013-07-28 NOTE — Progress Notes (Signed)
Order placed for abdominal US

## 2013-07-28 NOTE — Telephone Encounter (Signed)
Received a mychart message from patient stating: "Dr Nicki Reaper wants me to schedule an abdominal ultrasound and my better time would be July 14th morning or July 16th morning as I have other appointments on the 15th. I could come on (June 24th or June 26th.) preferably morning. Just notify me time and place. Thanks Lori Guzman" Please advise. There are no orders or referrals for a ultrasound/msn

## 2013-07-29 NOTE — Telephone Encounter (Signed)
Mailed unread message to pt  

## 2013-08-05 ENCOUNTER — Ambulatory Visit: Payer: Self-pay | Admitting: Internal Medicine

## 2013-08-06 ENCOUNTER — Other Ambulatory Visit (INDEPENDENT_AMBULATORY_CARE_PROVIDER_SITE_OTHER): Payer: Medicare Other

## 2013-08-06 DIAGNOSIS — R17 Unspecified jaundice: Secondary | ICD-10-CM

## 2013-08-06 LAB — HEPATIC FUNCTION PANEL
ALBUMIN: 4.3 g/dL (ref 3.5–5.2)
ALT: 18 U/L (ref 0–35)
AST: 18 U/L (ref 0–37)
Alkaline Phosphatase: 49 U/L (ref 39–117)
BILIRUBIN TOTAL: 0.8 mg/dL (ref 0.2–1.2)
Bilirubin, Direct: 0.2 mg/dL (ref 0.0–0.3)
Total Protein: 7 g/dL (ref 6.0–8.3)

## 2013-08-06 LAB — BILIRUBIN, DIRECT: Bilirubin, Direct: 0.2 mg/dL (ref 0.0–0.3)

## 2013-08-07 ENCOUNTER — Encounter: Payer: Self-pay | Admitting: Internal Medicine

## 2013-08-10 ENCOUNTER — Encounter: Payer: Self-pay | Admitting: Internal Medicine

## 2013-08-10 ENCOUNTER — Telehealth: Payer: Self-pay | Admitting: Internal Medicine

## 2013-08-10 NOTE — Telephone Encounter (Signed)
Pt notified of ultrasound results via my chart.  To let me know if agreeable to GI referral.

## 2013-08-12 NOTE — Telephone Encounter (Signed)
Unread mychart message mailed to patient 

## 2013-10-22 ENCOUNTER — Encounter: Payer: Self-pay | Admitting: Internal Medicine

## 2013-11-06 ENCOUNTER — Other Ambulatory Visit: Payer: Self-pay | Admitting: Internal Medicine

## 2013-11-11 ENCOUNTER — Other Ambulatory Visit: Payer: Self-pay | Admitting: Internal Medicine

## 2013-11-17 ENCOUNTER — Ambulatory Visit: Payer: Medicare Other | Admitting: Internal Medicine

## 2013-11-30 ENCOUNTER — Ambulatory Visit (INDEPENDENT_AMBULATORY_CARE_PROVIDER_SITE_OTHER): Payer: Medicare Other | Admitting: Internal Medicine

## 2013-11-30 ENCOUNTER — Encounter: Payer: Self-pay | Admitting: Internal Medicine

## 2013-11-30 VITALS — BP 130/60 | HR 68 | Temp 98.2°F | Ht 64.25 in | Wt 188.5 lb

## 2013-11-30 DIAGNOSIS — Z860100 Personal history of colon polyps, unspecified: Secondary | ICD-10-CM

## 2013-11-30 DIAGNOSIS — I10 Essential (primary) hypertension: Secondary | ICD-10-CM

## 2013-11-30 DIAGNOSIS — E119 Type 2 diabetes mellitus without complications: Secondary | ICD-10-CM

## 2013-11-30 DIAGNOSIS — K219 Gastro-esophageal reflux disease without esophagitis: Secondary | ICD-10-CM

## 2013-11-30 DIAGNOSIS — Z658 Other specified problems related to psychosocial circumstances: Secondary | ICD-10-CM

## 2013-11-30 DIAGNOSIS — Z23 Encounter for immunization: Secondary | ICD-10-CM

## 2013-11-30 DIAGNOSIS — M79605 Pain in left leg: Secondary | ICD-10-CM

## 2013-11-30 DIAGNOSIS — K068 Other specified disorders of gingiva and edentulous alveolar ridge: Secondary | ICD-10-CM

## 2013-11-30 DIAGNOSIS — F439 Reaction to severe stress, unspecified: Secondary | ICD-10-CM

## 2013-11-30 NOTE — Progress Notes (Signed)
Pre visit review using our clinic review tool, if applicable. No additional management support is needed unless otherwise documented below in the visit note. 

## 2013-12-06 ENCOUNTER — Other Ambulatory Visit: Payer: Self-pay | Admitting: Internal Medicine

## 2013-12-06 DIAGNOSIS — I10 Essential (primary) hypertension: Secondary | ICD-10-CM

## 2013-12-06 DIAGNOSIS — E7800 Pure hypercholesterolemia, unspecified: Secondary | ICD-10-CM

## 2013-12-06 DIAGNOSIS — E119 Type 2 diabetes mellitus without complications: Secondary | ICD-10-CM

## 2013-12-06 DIAGNOSIS — M549 Dorsalgia, unspecified: Secondary | ICD-10-CM | POA: Insufficient documentation

## 2013-12-06 NOTE — Assessment & Plan Note (Signed)
Increased stress with her husband's medical issues.  Feels she is coping relatively well.  Follow.   

## 2013-12-06 NOTE — Assessment & Plan Note (Signed)
Symptoms under control.  Follow.   

## 2013-12-06 NOTE — Assessment & Plan Note (Addendum)
Blood pressure - good control.  Follow metabolic panel.  Same medication regimen.  Follow.   BP Readings from Last 3 Encounters:  11/30/13 130/60  07/13/13 130/60  01/19/13 138/62

## 2013-12-06 NOTE — Assessment & Plan Note (Signed)
Is s/p biopsy 07/24/13 - Dr Kathyrn Sheriff.  Benign.

## 2013-12-06 NOTE — Assessment & Plan Note (Signed)
Diagnosed with sciatica.  Better.  Follow.

## 2013-12-06 NOTE — Progress Notes (Signed)
Subjective:    Patient ID: Lori Guzman, female    DOB: 1940/06/26, 73 y.o.   MRN: [id number]  HPI 73 year old female with past history of hypertension who comes in today for a scheduled follow up  She states she has been doing relatively well.  Feels she is handling stress relatively well. Trying to stay active.  No chest pain or tightness with increased activity or exertion.  Bowels stable.  Eating activia and drinking some buttermilk.  Breathing stable.   No abdominal pain or cramping.  Trying to watch her diet.  States blood pressure has been doing well.  Issues with left leg pain.  Told had sciatica.       Past Medical History  Diagnosis Date  . Hypertension   . GERD (gastroesophageal reflux disease)   . Allergy     reoccuring problems  . History of colon polyps     adenomatous    Current Outpatient Prescriptions on File Prior to Visit  Medication Sig Dispense Refill  . aspirin EC 81 MG tablet Take 81 mg by mouth daily.      . Blood Glucose Monitoring Suppl (CONTOUR NEXT EZ MONITOR) W/DEVICE KIT by Does not apply route.      Marland Kitchen escitalopram (LEXAPRO) 10 MG tablet TAKE 1 TABLET BY MOUTH EVERY DAY  30 tablet  2  . esomeprazole (NEXIUM) 40 MG capsule Take 1 capsule (40 mg total) by mouth daily.  90 capsule  1  . mometasone (NASONEX) 50 MCG/ACT nasal spray Place 2 sprays into the nose daily. As directed.  17 g  3  . telmisartan-hydrochlorothiazide (MICARDIS HCT) 80-12.5 MG per tablet TAKE 1 TABLET BY MOUTH DAILY.  30 tablet  5   No current facility-administered medications on file prior to visit.    Review of Systems Patient denies any headache, lightheadedness or dizziness.  No sinus or allergy problems.   No chest pain, tightness or palpitations.  No increased shortness of breath, cough or congestion.  No increased acid reflux, dysphagia or odynophagia.   No nausea or vomiting.  No abdominal pain or cramping.  Bowels stable.  No urine change.   Overall she feels she is doing  well.  Increased stress with husbands medical issues.  Feels she is handling things relatively well.  Trying to adjust her diet and exercise.  Blood pressures have been doing well.  Left leg pain.  Told had sciatica.  Better.       Objective:   Physical Exam  Filed Vitals:   11/30/13 0950  BP: 130/60  Pulse: 68  Temp: 98.2 F (83.49 C)   73 year old female in no acute distress.   HEENT:  Nares- clear.  Oropharynx - without lesions. NECK:  Supple.  Nontender.  No audible bruit.  HEART:  Appears to be regular. LUNGS:  No crackles or wheezing audible.  Respirations even and unlabored.  RADIAL PULSE:  Equal bilaterally.   ABDOMEN:  Soft, nontender.  Bowel sounds present and normal.  No audible abdominal bruit.   EXTREMITIES:  No increased edema present.  DP pulses palpable and equal bilaterally.      FEET:  No lesions.           Assessment & Plan:  GI.  Stomach and bowels appear to be doing better.  Follow.    HEALTH MAINTENANCE.  Physical 07/13/13.   Mammogram 04/08/12 - Birads I.  Overdue.  Schedule.  Colonoscopy 11/02/08 - diverticulosis.  Otherwise normal.  Recommend f/u colonoscopy in five years.  Due this year.  She wants to wait until after first of the year.     Problem List Items Addressed This Visit   Diabetes      A1c last checked 6.4 (07/2013).   Low carb diet and exercise.  Keep up to date with eye checks.    Lab Results  Component Value Date   HGBA1C 6.4 07/23/2013      GERD (gastroesophageal reflux disease)     Symptoms under control.  Follow.      Gum lesion     Is s/p biopsy 07/24/13 - Dr Kathyrn Sheriff.  Benign.        History of colonic polyps     History of adenomatous polyps.  Colonoscopy 11/02/08 - diverticulosis otherwise normal.  Recommend f/u colonoscopy in five years.  Due this year.   Wants to schedule after first of the year.       Relevant Orders      Ambulatory referral to Gastroenterology   Hypertension      Blood pressure - good control.  Follow  metabolic panel.  Same medication regimen.  Follow.   BP Readings from Last 3 Encounters:  11/30/13 130/60  07/13/13 130/60  01/19/13 138/62       Left leg pain     Diagnosed with sciatica.  Better.  Follow.      Stress     Increased stress with her husband's medical issues.  Feels she is coping relatively well.  Follow.        Other Visit Diagnoses   Encounter for immunization    -  Primary      I spent 25 minutes with the patient and more than 50% of the time was spent in consultation regarding the above.

## 2013-12-06 NOTE — Assessment & Plan Note (Addendum)
A1c last checked 6.4 (07/2013).   Low carb diet and exercise.  Keep up to date with eye checks.    Lab Results  Component Value Date   HGBA1C 6.4 07/23/2013

## 2013-12-06 NOTE — Progress Notes (Signed)
Orders placed for labs

## 2013-12-06 NOTE — Assessment & Plan Note (Signed)
History of adenomatous polyps.  Colonoscopy 11/02/08 - diverticulosis otherwise normal.  Recommend f/u colonoscopy in five years.  Due this year.   Wants to schedule after first of the year.

## 2013-12-17 ENCOUNTER — Other Ambulatory Visit (INDEPENDENT_AMBULATORY_CARE_PROVIDER_SITE_OTHER): Payer: Medicare Other

## 2013-12-17 DIAGNOSIS — E7800 Pure hypercholesterolemia, unspecified: Secondary | ICD-10-CM

## 2013-12-17 DIAGNOSIS — I10 Essential (primary) hypertension: Secondary | ICD-10-CM

## 2013-12-17 DIAGNOSIS — E119 Type 2 diabetes mellitus without complications: Secondary | ICD-10-CM

## 2013-12-17 LAB — BASIC METABOLIC PANEL
BUN: 14 mg/dL (ref 6–23)
CHLORIDE: 105 meq/L (ref 96–112)
CO2: 30 mEq/L (ref 19–32)
Calcium: 9.4 mg/dL (ref 8.4–10.5)
Creatinine, Ser: 0.8 mg/dL (ref 0.4–1.2)
GFR: 72.74 mL/min (ref >60.00)
Glucose, Bld: 102 mg/dL — ABNORMAL HIGH (ref 70–99)
POTASSIUM: 4.5 meq/L (ref 3.5–5.1)
Sodium: 142 mEq/L (ref 135–145)

## 2013-12-17 LAB — HEPATIC FUNCTION PANEL
ALBUMIN: 3.6 g/dL (ref 3.5–5.2)
ALT: 16 U/L (ref 0–35)
AST: 16 U/L (ref 0–37)
Alkaline Phosphatase: 49 U/L (ref 39–117)
Bilirubin, Direct: 0.1 mg/dL (ref 0.0–0.3)
TOTAL PROTEIN: 7 g/dL (ref 6.0–8.3)
Total Bilirubin: 1.1 mg/dL (ref 0.2–1.2)

## 2013-12-17 LAB — LIPID PANEL
CHOL/HDL RATIO: 4
Cholesterol: 186 mg/dL (ref 0–200)
HDL: 52.9 mg/dL (ref >39.00)
LDL Cholesterol: 118 mg/dL — ABNORMAL HIGH (ref 0–99)
NONHDL: 133.1
TRIGLYCERIDES: 75 mg/dL (ref 0.0–149.0)
VLDL: 15 mg/dL (ref 0.0–40.0)

## 2013-12-17 LAB — MICROALBUMIN / CREATININE URINE RATIO
Creatinine,U: 145.3 mg/dL
MICROALB UR: 1.3 mg/dL (ref 0.0–1.9)
MICROALB/CREAT RATIO: 0.9 mg/g (ref 0.0–30.0)

## 2013-12-17 LAB — HEMOGLOBIN A1C: Hgb A1c MFr Bld: 6.4 % (ref 4.6–6.5)

## 2013-12-18 ENCOUNTER — Encounter: Payer: Self-pay | Admitting: Internal Medicine

## 2014-01-19 ENCOUNTER — Ambulatory Visit: Payer: Self-pay | Admitting: Physician Assistant

## 2014-01-19 ENCOUNTER — Encounter: Payer: Self-pay | Admitting: *Deleted

## 2014-01-19 LAB — URINALYSIS, COMPLETE
BILIRUBIN, UR: NEGATIVE
BLOOD: NEGATIVE
Bacteria: NEGATIVE
GLUCOSE, UR: NEGATIVE
KETONE: NEGATIVE
Leukocyte Esterase: NEGATIVE
NITRITE: NEGATIVE
PH: 7.5 (ref 5.0–8.0)
Protein: NEGATIVE
Specific Gravity: 1.01 (ref 1.000–1.030)

## 2014-01-21 LAB — URINE CULTURE

## 2014-02-02 ENCOUNTER — Ambulatory Visit: Payer: Self-pay | Admitting: Internal Medicine

## 2014-02-02 LAB — HM MAMMOGRAPHY: HM MAMMO: NEGATIVE

## 2014-02-03 ENCOUNTER — Encounter: Payer: Self-pay | Admitting: *Deleted

## 2014-02-06 ENCOUNTER — Other Ambulatory Visit: Payer: Self-pay | Admitting: Internal Medicine

## 2014-03-09 ENCOUNTER — Encounter: Payer: Self-pay | Admitting: Internal Medicine

## 2014-04-02 ENCOUNTER — Encounter: Payer: Self-pay | Admitting: Internal Medicine

## 2014-04-02 ENCOUNTER — Ambulatory Visit (INDEPENDENT_AMBULATORY_CARE_PROVIDER_SITE_OTHER): Payer: Medicare Other | Admitting: Internal Medicine

## 2014-04-02 VITALS — BP 140/60 | HR 69 | Temp 98.1°F | Ht 64.25 in | Wt 187.5 lb

## 2014-04-02 DIAGNOSIS — Z658 Other specified problems related to psychosocial circumstances: Secondary | ICD-10-CM

## 2014-04-02 DIAGNOSIS — I10 Essential (primary) hypertension: Secondary | ICD-10-CM

## 2014-04-02 DIAGNOSIS — E7800 Pure hypercholesterolemia, unspecified: Secondary | ICD-10-CM

## 2014-04-02 DIAGNOSIS — Z86010 Personal history of colonic polyps: Secondary | ICD-10-CM

## 2014-04-02 DIAGNOSIS — Z0000 Encounter for general adult medical examination without abnormal findings: Secondary | ICD-10-CM

## 2014-04-02 DIAGNOSIS — F439 Reaction to severe stress, unspecified: Secondary | ICD-10-CM

## 2014-04-02 DIAGNOSIS — E669 Obesity, unspecified: Secondary | ICD-10-CM

## 2014-04-02 DIAGNOSIS — E119 Type 2 diabetes mellitus without complications: Secondary | ICD-10-CM

## 2014-04-02 DIAGNOSIS — K219 Gastro-esophageal reflux disease without esophagitis: Secondary | ICD-10-CM

## 2014-04-02 NOTE — Progress Notes (Signed)
Pre visit review using our clinic review tool, if applicable. No additional management support is needed unless otherwise documented below in the visit note. 

## 2014-04-05 ENCOUNTER — Encounter: Payer: Self-pay | Admitting: Internal Medicine

## 2014-04-05 DIAGNOSIS — Z0000 Encounter for general adult medical examination without abnormal findings: Secondary | ICD-10-CM | POA: Insufficient documentation

## 2014-04-05 DIAGNOSIS — Z6831 Body mass index (BMI) 31.0-31.9, adult: Secondary | ICD-10-CM | POA: Insufficient documentation

## 2014-04-05 NOTE — Assessment & Plan Note (Signed)
History of adenomatous polyps.  Colonoscopy 11/02/08 - diverticulosis otherwise normal.  Recommended f/u colonoscopy in five years.  She is scheduled 04/06/14 for colonoscopy.

## 2014-04-05 NOTE — Assessment & Plan Note (Signed)
Low carb diet and exercise.  Follow met b and a1c.   Lab Results  Component Value Date   HGBA1C 6.4 12/17/2013

## 2014-04-05 NOTE — Assessment & Plan Note (Signed)
Feels she is handling things well.  Follow.    

## 2014-04-05 NOTE — Assessment & Plan Note (Signed)
Controlled.  

## 2014-04-05 NOTE — Assessment & Plan Note (Signed)
Low cholesterol diet and exercise.  Follow lipid panel.   

## 2014-04-05 NOTE — Assessment & Plan Note (Signed)
Blood pressure well controlled on current regimen.  Follow pressures.  Follow met b.

## 2014-04-05 NOTE — Assessment & Plan Note (Signed)
Physical 6//15.  Mammogram 02/02/14 - Birads I.  Colonoscopy scheduled 04/06/14.

## 2014-04-05 NOTE — Progress Notes (Signed)
Patient ID: Lori Guzman, female   DOB: Sep 02, 1940, 74 y.o.   MRN: [id number]   Subjective:    Patient ID: Lori Guzman, female    DOB: [birth year], 74 y.o.   MRN: [id number]  HPI  Patient here for a scheduled follow up.  Tries to stay active.  No cardiac symptoms with increased activity and exertion.  Breathing stable.  Bowels stable.  Stomach doing well.  Planning for colonoscopy 04/06/14.  Blood pressure doing well.     Past Medical History  Diagnosis Date  . Hypertension   . GERD (gastroesophageal reflux disease)   . Allergy     reoccuring problems  . History of colon polyps     adenomatous    Current Outpatient Prescriptions on File Prior to Visit  Medication Sig Dispense Refill  . aspirin EC 81 MG tablet Take 81 mg by mouth daily.    . Blood Glucose Monitoring Suppl (CONTOUR NEXT EZ MONITOR) W/DEVICE KIT by Does not apply route.    Marland Kitchen escitalopram (LEXAPRO) 10 MG tablet TAKE 1 TABLET BY MOUTH EVERY DAY 30 tablet 2  . mometasone (NASONEX) 50 MCG/ACT nasal spray Place 2 sprays into the nose daily. As directed. (Patient not taking: Reported on 04/02/2014) 17 g 3  . telmisartan-hydrochlorothiazide (MICARDIS HCT) 80-12.5 MG per tablet TAKE 1 TABLET BY MOUTH DAILY. 30 tablet 5   No current facility-administered medications on file prior to visit.    Review of Systems  Constitutional: Negative for appetite change and unexpected weight change.  HENT: Negative for congestion and sinus pressure.   Respiratory: Negative for cough, chest tightness and shortness of breath.   Cardiovascular: Negative for chest pain, palpitations and leg swelling.  Gastrointestinal: Negative for nausea, vomiting, abdominal pain and diarrhea.  Genitourinary: Negative for dysuria and difficulty urinating.  Neurological: Negative for dizziness, light-headedness and headaches.  Psychiatric/Behavioral:       Handling stress well.         Objective:     Blood pressure recheck:   128/64  Physical Exam  Constitutional: She appears well-developed and well-nourished. No distress.  HENT:  Nose: Nose normal.  Mouth/Throat: Oropharynx is clear and moist.  Neck: Neck supple. No thyromegaly present.  Cardiovascular: Normal rate and regular rhythm.   Pulmonary/Chest: Breath sounds normal. No respiratory distress. She has no wheezes.  Abdominal: Soft. Bowel sounds are normal. There is no tenderness.  Musculoskeletal: She exhibits no edema or tenderness.  Lymphadenopathy:    She has no cervical adenopathy.    BP 140/60 mmHg  Pulse 69  Temp(Src) 98.1 F (36.7 C) (Oral)  Ht 5' 4.25" (1.632 m)  Wt 187 lb 8 oz (85.049 kg)  BMI 31.93 kg/m2  SpO2 98%  LMP 02/29/1992 Wt Readings from Last 3 Encounters:  04/02/14 187 lb 8 oz (85.049 kg)  11/30/13 188 lb 8 oz (85.503 kg)  07/13/13 191 lb (86.637 kg)     Lab Results  Component Value Date   WBC 4.9 07/23/2013   HGB 12.7 07/23/2013   HCT 37.3 07/23/2013   PLT 239.0 07/23/2013   GLUCOSE 102* 12/17/2013   CHOL 186 12/17/2013   TRIG 75.0 12/17/2013   HDL 52.90 12/17/2013   LDLCALC 118* 12/17/2013   ALT 16 12/17/2013   AST 16 12/17/2013   NA 142 12/17/2013   K 4.5 12/17/2013   CL 105 12/17/2013   CREATININE 0.8 12/17/2013   BUN 14 12/17/2013   CO2 30 12/17/2013   TSH 2.09 07/23/2013  HGBA1C 6.4 12/17/2013   MICROALBUR 1.3 12/17/2013       Assessment & Plan:   Problem List Items Addressed This Visit    Diabetes    Low carb diet and exercise.  Follow met b and a1c.   Lab Results  Component Value Date   HGBA1C 6.4 12/17/2013        Relevant Orders   Hemoglobin A1c   GERD (gastroesophageal reflux disease)    Controlled.        Health care maintenance    Physical 6//15.  Mammogram 02/02/14 - Birads I.  Colonoscopy scheduled 04/06/14.        History of colonic polyps    History of adenomatous polyps.  Colonoscopy 11/02/08 - diverticulosis otherwise normal.  Recommended f/u colonoscopy in five  years.  She is scheduled 04/06/14 for colonoscopy.        Hypercholesterolemia    Low cholesterol diet and exercise.  Follow lipid panel.        Relevant Orders   Lipid panel   Hepatic function panel   Hypertension - Primary    Blood pressure well controlled on current regimen.  Follow pressures.  Follow met b.       Relevant Orders   Basic metabolic panel   Obesity (BMI 30-39.9)    Diet and exercise.        Stress    Feels she is handling things well.  Follow.            Einar Pheasant, MD

## 2014-04-05 NOTE — Assessment & Plan Note (Signed)
Diet and exercise.   

## 2014-04-06 ENCOUNTER — Ambulatory Visit: Payer: Self-pay | Admitting: Gastroenterology

## 2014-04-06 LAB — HM COLONOSCOPY: HM Colonoscopy: NORMAL

## 2014-04-19 ENCOUNTER — Encounter: Payer: Self-pay | Admitting: Internal Medicine

## 2014-05-03 ENCOUNTER — Other Ambulatory Visit: Payer: Medicare Other

## 2014-05-25 ENCOUNTER — Other Ambulatory Visit: Payer: Medicare Other

## 2014-05-31 ENCOUNTER — Other Ambulatory Visit: Payer: Self-pay | Admitting: Internal Medicine

## 2014-06-08 ENCOUNTER — Other Ambulatory Visit (INDEPENDENT_AMBULATORY_CARE_PROVIDER_SITE_OTHER): Payer: Medicare Other

## 2014-06-08 DIAGNOSIS — I10 Essential (primary) hypertension: Secondary | ICD-10-CM | POA: Diagnosis not present

## 2014-06-08 DIAGNOSIS — E7800 Pure hypercholesterolemia, unspecified: Secondary | ICD-10-CM

## 2014-06-08 DIAGNOSIS — E119 Type 2 diabetes mellitus without complications: Secondary | ICD-10-CM

## 2014-06-08 LAB — HEPATIC FUNCTION PANEL
ALBUMIN: 4.2 g/dL (ref 3.5–5.2)
ALK PHOS: 57 U/L (ref 39–117)
ALT: 14 U/L (ref 0–35)
AST: 17 U/L (ref 0–37)
BILIRUBIN DIRECT: 0.1 mg/dL (ref 0.0–0.3)
BILIRUBIN TOTAL: 1.2 mg/dL (ref 0.2–1.2)
TOTAL PROTEIN: 6.6 g/dL (ref 6.0–8.3)

## 2014-06-08 LAB — LIPID PANEL
Cholesterol: 161 mg/dL (ref 0–200)
HDL: 53.2 mg/dL (ref >39.00)
LDL Cholesterol: 91 mg/dL (ref 0–99)
NonHDL: 107.8
Total CHOL/HDL Ratio: 3
Triglycerides: 82 mg/dL (ref 0.0–149.0)
VLDL: 16.4 mg/dL (ref 0.0–40.0)

## 2014-06-08 LAB — BASIC METABOLIC PANEL
BUN: 18 mg/dL (ref 6–23)
CHLORIDE: 105 meq/L (ref 96–112)
CO2: 31 mEq/L (ref 19–32)
Calcium: 9.5 mg/dL (ref 8.4–10.5)
Creatinine, Ser: 0.86 mg/dL (ref 0.40–1.20)
GFR: 68.76 mL/min (ref >60.00)
GLUCOSE: 101 mg/dL — AB (ref 70–99)
Potassium: 4.3 mEq/L (ref 3.5–5.1)
Sodium: 141 mEq/L (ref 135–145)

## 2014-06-08 LAB — HEMOGLOBIN A1C: Hgb A1c MFr Bld: 6.4 % (ref 4.6–6.5)

## 2014-06-09 ENCOUNTER — Encounter: Payer: Self-pay | Admitting: Internal Medicine

## 2014-08-09 ENCOUNTER — Ambulatory Visit (INDEPENDENT_AMBULATORY_CARE_PROVIDER_SITE_OTHER): Payer: Medicare Other | Admitting: Internal Medicine

## 2014-08-09 ENCOUNTER — Encounter: Payer: Self-pay | Admitting: Internal Medicine

## 2014-08-09 VITALS — BP 138/68 | HR 63 | Temp 98.1°F | Ht 64.25 in | Wt 192.0 lb

## 2014-08-09 DIAGNOSIS — Z658 Other specified problems related to psychosocial circumstances: Secondary | ICD-10-CM

## 2014-08-09 DIAGNOSIS — Z23 Encounter for immunization: Secondary | ICD-10-CM | POA: Diagnosis not present

## 2014-08-09 DIAGNOSIS — E119 Type 2 diabetes mellitus without complications: Secondary | ICD-10-CM | POA: Diagnosis not present

## 2014-08-09 DIAGNOSIS — K219 Gastro-esophageal reflux disease without esophagitis: Secondary | ICD-10-CM

## 2014-08-09 DIAGNOSIS — I10 Essential (primary) hypertension: Secondary | ICD-10-CM | POA: Diagnosis not present

## 2014-08-09 DIAGNOSIS — E669 Obesity, unspecified: Secondary | ICD-10-CM

## 2014-08-09 DIAGNOSIS — Z86010 Personal history of colonic polyps: Secondary | ICD-10-CM

## 2014-08-09 DIAGNOSIS — F439 Reaction to severe stress, unspecified: Secondary | ICD-10-CM

## 2014-08-09 DIAGNOSIS — R42 Dizziness and giddiness: Secondary | ICD-10-CM

## 2014-08-09 DIAGNOSIS — E7800 Pure hypercholesterolemia, unspecified: Secondary | ICD-10-CM

## 2014-08-09 DIAGNOSIS — Z0000 Encounter for general adult medical examination without abnormal findings: Secondary | ICD-10-CM

## 2014-08-09 NOTE — Progress Notes (Signed)
Patient ID: Lori Guzman, female   DOB: [birth year], 74 y.o.   MRN: [id number]   Subjective:    Patient ID: Zemirah Krasinski, female    DOB: 1940-09-04, 74 y.o.   MRN: [id number]  HPI  Patient here to follow up on her current medical issues as well as for a complete physical exam.   Some increased stress with her husband's and her granddaughter's medical issues.  She does not feel she needs anything more at this time.  No cardiac symptoms with increased activity or exertion.  Breathing stable.  Occasionally will notice some light headedness.  Very brief.  May only last a second.  Not daily.  No persistent dizziness.  No persistent light headedness.  No headache.  Does not stay hydrated.  Bowels stable.     Past Medical History  Diagnosis Date  . Hypertension   . GERD (gastroesophageal reflux disease)   . Allergy     reoccuring problems  . History of colon polyps     adenomatous    Current Outpatient Prescriptions on File Prior to Visit  Medication Sig Dispense Refill  . aspirin EC 81 MG tablet Take 81 mg by mouth daily.    Marland Kitchen escitalopram (LEXAPRO) 10 MG tablet TAKE 1 TABLET BY MOUTH EVERY DAY 30 tablet 2  . telmisartan-hydrochlorothiazide (MICARDIS HCT) 80-12.5 MG per tablet TAKE 1 TABLET BY MOUTH EVERY DAY 30 tablet 2   No current facility-administered medications on file prior to visit.    Review of Systems  Constitutional: Negative for appetite change and unexpected weight change.  HENT: Negative for congestion and sinus pressure.   Eyes: Negative for pain and visual disturbance.  Respiratory: Negative for cough, chest tightness and shortness of breath.   Cardiovascular: Negative for chest pain, palpitations and leg swelling.  Gastrointestinal: Negative for nausea, vomiting, abdominal pain and diarrhea.  Genitourinary: Negative for dysuria and difficulty urinating.  Musculoskeletal: Negative for back pain and joint swelling.  Skin: Negative for color change and  rash.  Neurological: Positive for light-headedness. Negative for dizziness and headaches.  Hematological: Negative for adenopathy. Does not bruise/bleed easily.  Psychiatric/Behavioral: Negative for dysphoric mood and agitation.       Objective:    Physical Exam  Constitutional: She is oriented to person, place, and time. She appears well-developed and well-nourished.  HENT:  Nose: Nose normal.  Mouth/Throat: Oropharynx is clear and moist.  Eyes: Right eye exhibits no discharge. Left eye exhibits no discharge. No scleral icterus.  Neck: Neck supple. No thyromegaly present.  Cardiovascular: Normal rate and regular rhythm.   Pulmonary/Chest: Breath sounds normal. No accessory muscle usage. No tachypnea. No respiratory distress. She has no decreased breath sounds. She has no wheezes. She has no rhonchi. Right breast exhibits no inverted nipple, no mass, no nipple discharge and no tenderness (no axillary adenopathy). Left breast exhibits no inverted nipple, no mass, no nipple discharge and no tenderness (no axilarry adenopathy).  Abdominal: Soft. Bowel sounds are normal. There is no tenderness.  Musculoskeletal: She exhibits no edema or tenderness.  Lymphadenopathy:    She has no cervical adenopathy.  Neurological: She is alert and oriented to person, place, and time.  Skin: Skin is warm. No rash noted.  Psychiatric: She has a normal mood and affect. Her behavior is normal.    BP 138/68 mmHg  Pulse 63  Temp(Src) 98.1 F (36.7 C) (Oral)  Ht 5' 4.25" (1.632 m)  Wt 192 lb (87.091 kg)  BMI 32.70 kg/m2  SpO2 97%  LMP 02/29/1992 Wt Readings from Last 3 Encounters:  08/09/14 192 lb (87.091 kg)  04/02/14 187 lb 8 oz (85.049 kg)  11/30/13 188 lb 8 oz (85.503 kg)     Lab Results  Component Value Date   WBC 4.9 07/23/2013   HGB 12.7 07/23/2013   HCT 37.3 07/23/2013   PLT 239.0 07/23/2013   GLUCOSE 101* 06/08/2014   CHOL 161 06/08/2014   TRIG 82.0 06/08/2014   HDL 53.20 06/08/2014    LDLCALC 91 06/08/2014   ALT 14 06/08/2014   AST 17 06/08/2014   NA 141 06/08/2014   K 4.3 06/08/2014   CL 105 06/08/2014   CREATININE 0.86 06/08/2014   BUN 18 06/08/2014   CO2 31 06/08/2014   TSH 2.09 07/23/2013   HGBA1C 6.4 06/08/2014   MICROALBUR 1.3 12/17/2013       Assessment & Plan:   Problem List Items Addressed This Visit    Diabetes    Low carb diet and exercise.  Follow met b and a1c.   Lab Results  Component Value Date   HGBA1C 6.4 06/08/2014        Relevant Orders   Hemoglobin A1c   GERD (gastroesophageal reflux disease)    No symptoms reported.       Relevant Orders   TSH   Basic metabolic panel   Health care maintenance    Physical today 08/09/14.  Mammogram 02/02/14 - Birads I.  Colonoscopy 04/06/14 - normal.  Recheck colonoscopy in five years.       History of colonic polyps    Colonoscopy 04/06/14 - normal.  Recommended f/u in five years.       Hypercholesterolemia    Low cholesterol diet and exercise.  Follow lipid panel.   Lab Results  Component Value Date   CHOL 161 06/08/2014   HDL 53.20 06/08/2014   LDLCALC 91 06/08/2014   TRIG 82.0 06/08/2014   CHOLHDL 3 06/08/2014        Relevant Orders   Lipid panel   Hepatic function panel   Hypertension    Blood pressure doing well.  Same medication regimen.  Follow pressures.  Follow metabolic panel.      Light headedness    Rarely occurs.  Very brief.  Stay hydrated.  Follow.  Notify me if persistent.        Relevant Orders   CBC with Differential/Platelet   Obesity (BMI 30-39.9)    Diet and exercise.        Stress    Does not feel she needs anything more at this time.  Follow.  On lexapro.  Continue.         Other Visit Diagnoses    Need for prophylactic vaccination against Streptococcus pneumoniae (pneumococcus)    -  Primary    Relevant Orders    Pneumococcal conjugate vaccine 13-valent (Completed)      I spent 25 minutes with the patient and more than 50% of the time  was spent in consultation regarding the above.     Einar Pheasant, MD

## 2014-08-09 NOTE — Progress Notes (Signed)
Pre visit review using our clinic review tool, if applicable. No additional management support is needed unless otherwise documented below in the visit note. 

## 2014-08-10 ENCOUNTER — Encounter: Payer: Self-pay | Admitting: Internal Medicine

## 2014-08-10 DIAGNOSIS — R42 Dizziness and giddiness: Secondary | ICD-10-CM | POA: Insufficient documentation

## 2014-08-10 NOTE — Assessment & Plan Note (Signed)
Does not feel she needs anything more at this time.  Follow.  On lexapro.  Continue.

## 2014-08-10 NOTE — Assessment & Plan Note (Signed)
Low carb diet and exercise.  Follow met b and a1c.   Lab Results  Component Value Date   HGBA1C 6.4 06/08/2014

## 2014-08-10 NOTE — Assessment & Plan Note (Signed)
Colonoscopy 04/06/14 - normal.  Recommended f/u in five years.

## 2014-08-10 NOTE — Assessment & Plan Note (Signed)
Diet and exercise.   

## 2014-08-10 NOTE — Assessment & Plan Note (Signed)
Low cholesterol diet and exercise.  Follow lipid panel.   Lab Results  Component Value Date   CHOL 161 06/08/2014   HDL 53.20 06/08/2014   LDLCALC 91 06/08/2014   TRIG 82.0 06/08/2014   CHOLHDL 3 06/08/2014

## 2014-08-10 NOTE — Assessment & Plan Note (Signed)
Blood pressure doing well.  Same medication regimen.  Follow pressures.  Follow metabolic panel.   

## 2014-08-10 NOTE — Assessment & Plan Note (Signed)
Rarely occurs.  Very brief.  Stay hydrated.  Follow.  Notify me if persistent.

## 2014-08-10 NOTE — Assessment & Plan Note (Signed)
Physical today 08/09/14.  Mammogram 02/02/14 - Birads I.  Colonoscopy 04/06/14 - normal.  Recheck colonoscopy in five years.

## 2014-08-10 NOTE — Assessment & Plan Note (Signed)
No symptoms reported.   

## 2014-09-04 ENCOUNTER — Other Ambulatory Visit: Payer: Self-pay | Admitting: Internal Medicine

## 2014-12-03 ENCOUNTER — Other Ambulatory Visit: Payer: Self-pay | Admitting: Internal Medicine

## 2014-12-03 NOTE — Telephone Encounter (Signed)
Refilled lexapro #90 with one refill.

## 2014-12-03 NOTE — Telephone Encounter (Signed)
Please advise on refill of Lexapro.

## 2014-12-09 ENCOUNTER — Ambulatory Visit (INDEPENDENT_AMBULATORY_CARE_PROVIDER_SITE_OTHER): Payer: Medicare Other | Admitting: Internal Medicine

## 2014-12-09 ENCOUNTER — Encounter: Payer: Self-pay | Admitting: Internal Medicine

## 2014-12-09 VITALS — BP 120/70 | HR 64 | Temp 98.0°F | Resp 18 | Ht 64.25 in | Wt 189.0 lb

## 2014-12-09 DIAGNOSIS — R42 Dizziness and giddiness: Secondary | ICD-10-CM | POA: Diagnosis not present

## 2014-12-09 DIAGNOSIS — Z860100 Personal history of colon polyps, unspecified: Secondary | ICD-10-CM

## 2014-12-09 DIAGNOSIS — I10 Essential (primary) hypertension: Secondary | ICD-10-CM

## 2014-12-09 DIAGNOSIS — Z23 Encounter for immunization: Secondary | ICD-10-CM | POA: Diagnosis not present

## 2014-12-09 DIAGNOSIS — K219 Gastro-esophageal reflux disease without esophagitis: Secondary | ICD-10-CM | POA: Diagnosis not present

## 2014-12-09 DIAGNOSIS — E119 Type 2 diabetes mellitus without complications: Secondary | ICD-10-CM | POA: Diagnosis not present

## 2014-12-09 DIAGNOSIS — E7800 Pure hypercholesterolemia, unspecified: Secondary | ICD-10-CM

## 2014-12-09 DIAGNOSIS — E669 Obesity, unspecified: Secondary | ICD-10-CM

## 2014-12-09 DIAGNOSIS — Z1239 Encounter for other screening for malignant neoplasm of breast: Secondary | ICD-10-CM

## 2014-12-09 DIAGNOSIS — M5442 Lumbago with sciatica, left side: Secondary | ICD-10-CM

## 2014-12-09 LAB — CBC WITH DIFFERENTIAL/PLATELET
BASOS ABS: 0 10*3/uL (ref 0.0–0.1)
Basophils Relative: 0.5 % (ref 0.0–3.0)
EOS ABS: 0.1 10*3/uL (ref 0.0–0.7)
Eosinophils Relative: 2.1 % (ref 0.0–5.0)
HEMATOCRIT: 38.6 % (ref 36.0–46.0)
HEMOGLOBIN: 12.9 g/dL (ref 12.0–15.0)
LYMPHS PCT: 33.8 % (ref 12.0–46.0)
Lymphs Abs: 1.9 10*3/uL (ref 0.7–4.0)
MCHC: 33.3 g/dL (ref 30.0–36.0)
MCV: 91.7 fl (ref 78.0–100.0)
MONOS PCT: 8.4 % (ref 3.0–12.0)
Monocytes Absolute: 0.5 10*3/uL (ref 0.1–1.0)
Neutro Abs: 3.1 10*3/uL (ref 1.4–7.7)
Neutrophils Relative %: 55.2 % (ref 43.0–77.0)
Platelets: 270 10*3/uL (ref 150.0–400.0)
RBC: 4.21 Mil/uL (ref 3.87–5.11)
RDW: 13.5 % (ref 11.5–15.5)
WBC: 5.7 10*3/uL (ref 4.0–10.5)

## 2014-12-09 LAB — BASIC METABOLIC PANEL
BUN: 18 mg/dL (ref 6–23)
CALCIUM: 10.1 mg/dL (ref 8.4–10.5)
CO2: 32 mEq/L (ref 19–32)
CREATININE: 0.89 mg/dL (ref 0.40–1.20)
Chloride: 103 mEq/L (ref 96–112)
GFR: 66 mL/min (ref >60.00)
Glucose, Bld: 104 mg/dL — ABNORMAL HIGH (ref 70–99)
Potassium: 4.3 mEq/L (ref 3.5–5.1)
Sodium: 141 mEq/L (ref 135–145)

## 2014-12-09 LAB — LIPID PANEL
CHOLESTEROL: 176 mg/dL (ref 0–200)
HDL: 53.9 mg/dL (ref >39.00)
LDL CALC: 108 mg/dL — AB (ref 0–99)
NonHDL: 121.82
TRIGLYCERIDES: 69 mg/dL (ref 0.0–149.0)
Total CHOL/HDL Ratio: 3
VLDL: 13.8 mg/dL (ref 0.0–40.0)

## 2014-12-09 LAB — HEPATIC FUNCTION PANEL
ALBUMIN: 4.3 g/dL (ref 3.5–5.2)
ALT: 13 U/L (ref 0–35)
AST: 17 U/L (ref 0–37)
Alkaline Phosphatase: 56 U/L (ref 39–117)
Bilirubin, Direct: 0.2 mg/dL (ref 0.0–0.3)
TOTAL PROTEIN: 7.1 g/dL (ref 6.0–8.3)
Total Bilirubin: 1.5 mg/dL — ABNORMAL HIGH (ref 0.2–1.2)

## 2014-12-09 LAB — TSH: TSH: 2.45 u[IU]/mL (ref 0.35–4.50)

## 2014-12-09 LAB — HEMOGLOBIN A1C: HEMOGLOBIN A1C: 6.4 % (ref 4.6–6.5)

## 2014-12-09 NOTE — Assessment & Plan Note (Signed)
Symptoms controlled.  Watches what she eats and does not eat late.  Takes zantac.  Controlled.

## 2014-12-09 NOTE — Progress Notes (Signed)
Patient ID: Scotti Motter, female   DOB: [birth year], 74 y.o.   MRN: [id number]   Subjective:    Patient ID: Alyanna Stoermer, female    DOB: 08-Oct-1940, 74 y.o.   MRN: [id number]  HPI  Patient with past history of GERD, diabetes, hypercholesterolemia and hypertension who comes in today to follow up on these issues.  She is doing well.  Was working a couple of weeks ago.  States she "over did it".  Left low back and leg pain.  This has improved.  Took tylenol.  Does not feel needs any further intervention.  No chest pain or tightness.  No sob.  No acid reflux.  No abdominal pain or cramping.  Bowels stable.  Stomach does well if she watches what she eats and does not eat late.  Handling stress well.  Blood pressure averaging 130's/60s.     Past Medical History  Diagnosis Date  . Hypertension   . GERD (gastroesophageal reflux disease)   . Allergy     reoccuring problems  . History of colon polyps     adenomatous   Past Surgical History  Procedure Laterality Date  . Cholecystectomy    . Tonsilectomy/adenoidectomy with myringotomy  1950  . Nasal polyp surgery  1964  . Bartholin gland cyst excision     Family History  Problem Relation Age of Onset  . Heart disease Mother   . CVA Mother   . Hypertension Mother   . Alzheimer's disease Mother   . Esophageal cancer Father   . Breast cancer Neg Hx   . Colon cancer Neg Hx    Social History   Social History  . Marital Status: Married    Spouse Name: N/A  . Number of Children: N/A  . Years of Education: N/A   Social History Main Topics  . Smoking status: Never Smoker   . Smokeless tobacco: Never Used  . Alcohol Use: No  . Drug Use: No  . Sexual Activity: Not Asked   Other Topics Concern  . None   Social History Narrative   She is married, has 3 children and is retired.    Outpatient Encounter Prescriptions as of 12/09/2014  Medication Sig  . aspirin EC 81 MG tablet Take 81 mg by mouth daily.  Marland Kitchen  escitalopram (LEXAPRO) 10 MG tablet TAKE 1 TABLET BY MOUTH EVERY DAY  . telmisartan-hydrochlorothiazide (MICARDIS HCT) 80-12.5 MG tablet TAKE 1 TABLET BY MOUTH EVERY DAY   No facility-administered encounter medications on file as of 12/09/2014.    Review of Systems  Constitutional: Negative for appetite change and unexpected weight change.  HENT: Positive for sneezing. Negative for congestion and sinus pressure.   Respiratory: Negative for cough, chest tightness and shortness of breath.   Cardiovascular: Negative for chest pain, palpitations and leg swelling.  Gastrointestinal: Negative for nausea, vomiting, abdominal pain and diarrhea.  Genitourinary: Negative for dysuria and difficulty urinating.  Musculoskeletal: Positive for back pain (better.  has some pain down left leg.  better.).  Skin: Negative for color change and rash.  Neurological: Negative for dizziness, light-headedness and headaches.  Psychiatric/Behavioral: Negative for dysphoric mood and agitation.       Objective:     Blood pressure rechecked by me:  138/70  Physical Exam  Constitutional: She appears well-developed and well-nourished. No distress.  HENT:  Nose: Nose normal.  Mouth/Throat: Oropharynx is clear and moist.  Eyes: Conjunctivae are normal. Right eye exhibits no discharge. Left eye exhibits no discharge.  Neck: Neck supple. No thyromegaly present.  Cardiovascular: Normal rate and regular rhythm.   Pulmonary/Chest: Breath sounds normal. No respiratory distress. She has no wheezes.  Abdominal: Soft. Bowel sounds are normal. There is no tenderness.  Musculoskeletal: She exhibits no edema or tenderness.  Lymphadenopathy:    She has no cervical adenopathy.  Skin: No rash noted. No erythema.  Psychiatric: She has a normal mood and affect. Her behavior is normal.    BP 120/70 mmHg  Pulse 64  Temp(Src) 98 F (36.7 C) (Oral)  Resp 18  Ht 5' 4.25" (1.632 m)  Wt 189 lb (85.73 kg)  BMI 32.19 kg/m2   SpO2 97%  LMP 02/29/1992 Wt Readings from Last 3 Encounters:  12/09/14 189 lb (85.73 kg)  08/09/14 192 lb (87.091 kg)  04/02/14 187 lb 8 oz (85.049 kg)     Lab Results  Component Value Date   WBC 5.7 12/09/2014   HGB 12.9 12/09/2014   HCT 38.6 12/09/2014   PLT 270.0 12/09/2014   GLUCOSE 104* 12/09/2014   CHOL 176 12/09/2014   TRIG 69.0 12/09/2014   HDL 53.90 12/09/2014   LDLCALC 108* 12/09/2014   ALT 13 12/09/2014   AST 17 12/09/2014   NA 141 12/09/2014   K 4.3 12/09/2014   CL 103 12/09/2014   CREATININE 0.89 12/09/2014   BUN 18 12/09/2014   CO2 32 12/09/2014   TSH 2.45 12/09/2014   HGBA1C 6.4 12/09/2014   MICROALBUR 1.3 12/17/2013       Assessment & Plan:   Problem List Items Addressed This Visit    Back pain    Low back pain and pain into side of her leg.  Occurred after she over worked.  Is better.  Desires no further intervention at time time.  Follow.        Diabetes ([place])    Low carb diet and exercise.  Follow met b and a1c.  She tries to stay active.    Lab Results  Component Value Date   HGBA1C 6.4 12/09/2014        GERD (gastroesophageal reflux disease)    Symptoms controlled.  Watches what she eats and does not eat late.  Takes zantac.  Controlled.        History of colonic polyps    Colonoscopy 04/06/14 - entire colon normal.  Recommend f/u colonoscopy in 5 years.        Hypercholesterolemia    Low cholesterol diet and exercise.  Follow lipid panel.        Hypertension    Blood pressure under good control.  Continue same medication regimen.  Follow pressures.  Follow metabolic panel.        Light headedness    Not reported as an issue.  Follow.        Obesity (BMI 30-39.9)    Diet and exercise.  Follow.        Other Visit Diagnoses    Encounter for immunization    -  Primary    Breast cancer screening        Relevant Orders    MM DIGITAL SCREENING BILATERAL    Type 2 diabetes mellitus without complication (HCC)             Einar Pheasant, MD

## 2014-12-09 NOTE — Patient Instructions (Signed)

## 2014-12-09 NOTE — Progress Notes (Signed)
Pre-visit discussion using our clinic review tool. No additional management support is needed unless otherwise documented below in the visit note.  

## 2014-12-09 NOTE — Assessment & Plan Note (Signed)
Blood pressure under good control.  Continue same medication regimen.  Follow pressures.  Follow metabolic panel.   

## 2014-12-10 ENCOUNTER — Other Ambulatory Visit: Payer: Self-pay | Admitting: Internal Medicine

## 2014-12-10 NOTE — Progress Notes (Signed)
Order placed for f/u liver panel.  

## 2014-12-13 ENCOUNTER — Encounter: Payer: Self-pay | Admitting: Internal Medicine

## 2014-12-13 NOTE — Assessment & Plan Note (Signed)
Diet and exercise.  Follow.  

## 2014-12-13 NOTE — Assessment & Plan Note (Addendum)
Low carb diet and exercise.  Follow met b and a1c.  She tries to stay active.    Lab Results  Component Value Date   HGBA1C 6.4 12/09/2014   

## 2014-12-13 NOTE — Assessment & Plan Note (Signed)
Colonoscopy 04/06/14 - entire colon normal.  Recommend f/u colonoscopy in 5 years.

## 2014-12-13 NOTE — Assessment & Plan Note (Signed)
Low back pain and pain into side of her leg.  Occurred after she over worked.  Is better.  Desires no further intervention at time time.  Follow.

## 2014-12-13 NOTE — Assessment & Plan Note (Signed)
Low cholesterol diet and exercise.  Follow lipid panel.   

## 2014-12-13 NOTE — Assessment & Plan Note (Signed)
Not reported as an issue.  Follow.

## 2014-12-22 ENCOUNTER — Other Ambulatory Visit (INDEPENDENT_AMBULATORY_CARE_PROVIDER_SITE_OTHER): Payer: Medicare Other

## 2014-12-22 LAB — HEPATIC FUNCTION PANEL
ALT: 15 U/L (ref 0–35)
AST: 20 U/L (ref 0–37)
Albumin: 4.3 g/dL (ref 3.5–5.2)
Alkaline Phosphatase: 56 U/L (ref 39–117)
BILIRUBIN DIRECT: 0.1 mg/dL (ref 0.0–0.3)
Total Bilirubin: 1.1 mg/dL (ref 0.2–1.2)
Total Protein: 7 g/dL (ref 6.0–8.3)

## 2014-12-23 ENCOUNTER — Encounter: Payer: Self-pay | Admitting: Internal Medicine

## 2014-12-28 NOTE — Telephone Encounter (Signed)
Unread mychart message mailed to patient 

## 2014-12-31 ENCOUNTER — Telehealth: Payer: Self-pay | Admitting: *Deleted

## 2014-12-31 NOTE — Telephone Encounter (Signed)
Patient requested  A call about her her lab  Results from 11/9.

## 2014-12-31 NOTE — Telephone Encounter (Signed)
Pt did not received lab results. I have given her the results.

## 2015-03-07 ENCOUNTER — Other Ambulatory Visit: Payer: Self-pay | Admitting: Internal Medicine

## 2015-03-09 ENCOUNTER — Other Ambulatory Visit: Payer: Self-pay | Admitting: Internal Medicine

## 2015-03-09 ENCOUNTER — Ambulatory Visit
Admission: RE | Admit: 2015-03-09 | Discharge: 2015-03-09 | Disposition: A | Discharge status: Home / Self Care | Payer: Medicare Other | Source: Ambulatory Visit | Attending: Internal Medicine | Admitting: Internal Medicine

## 2015-03-09 DIAGNOSIS — Z1231 Encounter for screening mammogram for malignant neoplasm of breast: Secondary | ICD-10-CM | POA: Insufficient documentation

## 2015-03-09 DIAGNOSIS — Z1239 Encounter for other screening for malignant neoplasm of breast: Secondary | ICD-10-CM

## 2015-03-09 LAB — HM MAMMOGRAPHY

## 2015-06-04 ENCOUNTER — Other Ambulatory Visit: Payer: Self-pay | Admitting: Internal Medicine

## 2015-08-12 ENCOUNTER — Encounter: Payer: Self-pay | Admitting: Internal Medicine

## 2015-08-22 ENCOUNTER — Other Ambulatory Visit: Payer: Self-pay | Admitting: Internal Medicine

## 2015-08-22 ENCOUNTER — Ambulatory Visit (INDEPENDENT_AMBULATORY_CARE_PROVIDER_SITE_OTHER): Payer: Medicare Other | Admitting: Internal Medicine

## 2015-08-22 ENCOUNTER — Encounter: Payer: Self-pay | Admitting: Internal Medicine

## 2015-08-22 VITALS — BP 124/60 | HR 69 | Temp 97.9°F | Resp 18 | Ht 64.25 in | Wt 188.5 lb

## 2015-08-22 DIAGNOSIS — R5383 Other fatigue: Secondary | ICD-10-CM | POA: Insufficient documentation

## 2015-08-22 DIAGNOSIS — E119 Type 2 diabetes mellitus without complications: Secondary | ICD-10-CM

## 2015-08-22 DIAGNOSIS — F439 Reaction to severe stress, unspecified: Secondary | ICD-10-CM

## 2015-08-22 DIAGNOSIS — Z658 Other specified problems related to psychosocial circumstances: Secondary | ICD-10-CM

## 2015-08-22 DIAGNOSIS — I10 Essential (primary) hypertension: Secondary | ICD-10-CM | POA: Diagnosis not present

## 2015-08-22 DIAGNOSIS — R002 Palpitations: Secondary | ICD-10-CM | POA: Diagnosis not present

## 2015-08-22 DIAGNOSIS — E7800 Pure hypercholesterolemia, unspecified: Secondary | ICD-10-CM | POA: Diagnosis not present

## 2015-08-22 DIAGNOSIS — R55 Syncope and collapse: Secondary | ICD-10-CM | POA: Diagnosis not present

## 2015-08-22 DIAGNOSIS — E669 Obesity, unspecified: Secondary | ICD-10-CM

## 2015-08-22 LAB — MICROALBUMIN / CREATININE URINE RATIO
CREATININE, U: 200.2 mg/dL
MICROALB UR: 1.5 mg/dL (ref 0.0–1.9)
Microalb Creat Ratio: 0.7 mg/g (ref 0.0–30.0)

## 2015-08-22 LAB — HEPATIC FUNCTION PANEL
ALBUMIN: 4.4 g/dL (ref 3.5–5.2)
ALT: 12 U/L (ref 0–35)
AST: 15 U/L (ref 0–37)
Alkaline Phosphatase: 49 U/L (ref 39–117)
Bilirubin, Direct: 0.2 mg/dL (ref 0.0–0.3)
Total Bilirubin: 1.5 mg/dL — ABNORMAL HIGH (ref 0.2–1.2)
Total Protein: 7 g/dL (ref 6.0–8.3)

## 2015-08-22 LAB — CBC WITH DIFFERENTIAL/PLATELET
BASOS PCT: 0.6 % (ref 0.0–3.0)
Basophils Absolute: 0 10*3/uL (ref 0.0–0.1)
EOS PCT: 2.3 % (ref 0.0–5.0)
Eosinophils Absolute: 0.2 10*3/uL (ref 0.0–0.7)
HEMATOCRIT: 39.1 % (ref 36.0–46.0)
HEMOGLOBIN: 13.1 g/dL (ref 12.0–15.0)
Lymphocytes Relative: 35.4 % (ref 12.0–46.0)
Lymphs Abs: 2.3 10*3/uL (ref 0.7–4.0)
MCHC: 33.6 g/dL (ref 30.0–36.0)
MCV: 90.7 fl (ref 78.0–100.0)
Monocytes Absolute: 0.6 10*3/uL (ref 0.1–1.0)
Monocytes Relative: 9.8 % (ref 3.0–12.0)
Neutro Abs: 3.4 10*3/uL (ref 1.4–7.7)
Neutrophils Relative %: 51.9 % (ref 43.0–77.0)
Platelets: 256 10*3/uL (ref 150.0–400.0)
RBC: 4.31 Mil/uL (ref 3.87–5.11)
RDW: 13 % (ref 11.5–15.5)
WBC: 6.6 10*3/uL (ref 4.0–10.5)

## 2015-08-22 LAB — LIPID PANEL
CHOL/HDL RATIO: 3
Cholesterol: 181 mg/dL (ref 0–200)
HDL: 53 mg/dL (ref >39.00)
LDL Cholesterol: 115 mg/dL — ABNORMAL HIGH (ref 0–99)
NONHDL: 127.67
Triglycerides: 62 mg/dL (ref 0.0–149.0)
VLDL: 12.4 mg/dL (ref 0.0–40.0)

## 2015-08-22 LAB — BASIC METABOLIC PANEL
BUN: 27 mg/dL — ABNORMAL HIGH (ref 6–23)
CHLORIDE: 105 meq/L (ref 96–112)
CO2: 29 mEq/L (ref 19–32)
CREATININE: 0.88 mg/dL (ref 0.40–1.20)
Calcium: 9.7 mg/dL (ref 8.4–10.5)
GFR: 66.74 mL/min (ref >60.00)
Glucose, Bld: 107 mg/dL — ABNORMAL HIGH (ref 70–99)
Potassium: 4.2 mEq/L (ref 3.5–5.1)
Sodium: 141 mEq/L (ref 135–145)

## 2015-08-22 LAB — HEMOGLOBIN A1C: HEMOGLOBIN A1C: 6.4 % (ref 4.6–6.5)

## 2015-08-22 LAB — TSH: TSH: 2.34 u[IU]/mL (ref 0.35–4.50)

## 2015-08-22 LAB — VITAMIN B12: Vitamin B-12: 418 pg/mL (ref 211–911)

## 2015-08-22 MED ORDER — TELMISARTAN 80 MG PO TABS: 80.0000 mg | ORAL_TABLET | Freq: Every day | ORAL | Status: DC

## 2015-08-22 NOTE — Progress Notes (Signed)
Pre-visit discussion using our clinic review tool. No additional management support is needed unless otherwise documented below in the visit note.  

## 2015-08-22 NOTE — Progress Notes (Signed)
Patient ID: Lori Guzman, female   DOB: 1940-09-28, 75 y.o.   MRN: [id number]   Subjective:    Patient ID: Lori Guzman, female    DOB: [birth year], 75 y.o.   MRN: [id number]  HPI  Patient here for a scheduled follow up.  States has been more fatigued.  Has noticed more palpitations.  No chest pain.  Does report some sob with exertion.  Has had a couple of episodes of near syncope.  Was standing and washing dishes.  Felt the sensation briefly.  Put her head down and symptoms resolved.  Stays active.  Works outside.  Blood pressure has been running lower - 120-130/60s.   She is eating.  Discussed the need to stay hydrated.  No abdominal pain or cramping.  Bowels stable.  Increased stress with her husband's medical issues.  Feels she is handling things relatively well.  Has started low carb diet.    Past Medical History  Diagnosis Date  . Hypertension   . GERD (gastroesophageal reflux disease)   . Allergy     reoccuring problems  . History of colon polyps     adenomatous   Past Surgical History  Procedure Laterality Date  . Cholecystectomy    . Tonsilectomy/adenoidectomy with myringotomy  1950  . Nasal polyp surgery  1964  . Bartholin gland cyst excision     Family History  Problem Relation Age of Onset  . Heart disease Mother   . CVA Mother   . Hypertension Mother   . Alzheimer's disease Mother   . Esophageal cancer Father   . Breast cancer Neg Hx   . Colon cancer Neg Hx    Social History   Social History  . Marital Status: Married    Spouse Name: N/A  . Number of Children: N/A  . Years of Education: N/A   Social History Main Topics  . Smoking status: Never Smoker   . Smokeless tobacco: Never Used  . Alcohol Use: No  . Drug Use: No  . Sexual Activity: Not Asked   Other Topics Concern  . None   Social History Narrative   She is married, has 3 children and is retired.    Outpatient Encounter Prescriptions as of 08/22/2015  Medication Sig  .  aspirin EC 81 MG tablet Take 81 mg by mouth daily.  Marland Kitchen escitalopram (LEXAPRO) 10 MG tablet TAKE 1 TABLET BY MOUTH EVERY DAY  . [DISCONTINUED] telmisartan-hydrochlorothiazide (MICARDIS HCT) 80-12.5 MG tablet TAKE 1 TABLET BY MOUTH EVERY DAY  . telmisartan (MICARDIS) 80 MG tablet Take 1 tablet (80 mg total) by mouth daily.   No facility-administered encounter medications on file as of 08/22/2015.    Review of Systems  Constitutional: Positive for fatigue. Negative for appetite change and unexpected weight change.  HENT: Negative for congestion and sinus pressure.   Respiratory: Positive for shortness of breath. Negative for cough and chest tightness.   Cardiovascular: Positive for palpitations. Negative for chest pain and leg swelling.  Gastrointestinal: Negative for nausea, vomiting, abdominal pain and diarrhea.  Genitourinary: Negative for dysuria and difficulty urinating.  Musculoskeletal: Negative for back pain and joint swelling.  Skin: Negative for color change and rash.  Neurological: Negative for dizziness, light-headedness and headaches.       Had the episode of near syncope as outlined.    Psychiatric/Behavioral: Negative for dysphoric mood and agitation.       Objective:    Physical Exam  Constitutional: She appears well-developed and well-nourished. No  distress.  HENT:  Nose: Nose normal.  Mouth/Throat: Oropharynx is clear and moist.  Neck: Neck supple. No thyromegaly present.  Cardiovascular: Normal rate and regular rhythm.   Pulmonary/Chest: Breath sounds normal. No respiratory distress. She has no wheezes.  Abdominal: Soft. Bowel sounds are normal. There is no tenderness.  Musculoskeletal: She exhibits no edema or tenderness.  Lymphadenopathy:    She has no cervical adenopathy.  Skin: No rash noted. No erythema.  Psychiatric: She has a normal mood and affect. Her behavior is normal.    BP 124/60 mmHg  Pulse 69  Temp(Src) 97.9 F (36.6 C) (Oral)  Resp 18  Ht 5'  4.25" (1.632 m)  Wt 188 lb 8 oz (85.503 kg)  BMI 32.10 kg/m2  SpO2 97%  LMP 02/29/1992 Wt Readings from Last 3 Encounters:  08/22/15 188 lb 8 oz (85.503 kg)  12/09/14 189 lb (85.73 kg)  08/09/14 192 lb (87.091 kg)     Lab Results  Component Value Date   WBC 6.6 08/22/2015   HGB 13.1 08/22/2015   HCT 39.1 08/22/2015   PLT 256.0 08/22/2015   GLUCOSE 107* 08/22/2015   CHOL 181 08/22/2015   TRIG 62.0 08/22/2015   HDL 53.00 08/22/2015   LDLCALC 115* 08/22/2015   ALT 12 08/22/2015   AST 15 08/22/2015   NA 141 08/22/2015   K 4.2 08/22/2015   CL 105 08/22/2015   CREATININE 0.88 08/22/2015   BUN 27* 08/22/2015   CO2 29 08/22/2015   TSH 2.34 08/22/2015   HGBA1C 6.4 08/22/2015   MICROALBUR 1.5 08/22/2015    Mm Screening Breast Tomo Bilateral  03/09/2015  CLINICAL DATA:  Screening. EXAM: DIGITAL SCREENING BILATERAL MAMMOGRAM WITH 3D TOMO WITH CAD COMPARISON:  Previous exam(s). ACR Breast Density Category b: There are scattered areas of fibroglandular density. FINDINGS: There are no findings suspicious for malignancy. Images were processed with CAD. IMPRESSION: No mammographic evidence of malignancy. A result letter of this screening mammogram will be mailed directly to the patient. RECOMMENDATION: Screening mammogram in one year. (Code:SM-B-01Y) BI-RADS CATEGORY  1: Negative. Electronically Signed   By: Lillia Mountain M.D.   On: 03/09/2015 15:05       Assessment & Plan:   Problem List Items Addressed This Visit    Diabetes ([place])    Low carb diet and exercise.  Follow met b and a1c.        Relevant Medications   telmisartan (MICARDIS) 80 MG tablet   Other Relevant Orders   Hemoglobin A1c (Completed)   Basic metabolic panel (Completed)   Microalbumin / creatinine urine ratio (Completed)   Fatigue    EKG as outlined.  Check routine labs as outlined.  W/up planned as outliend.       Relevant Orders   CBC with Differential/Platelet (Completed)   TSH (Completed)   Vitamin B12  (Completed)   Hypercholesterolemia    Low cholesterol diet and exercise.  Follow lipid panel.        Relevant Medications   telmisartan (MICARDIS) 80 MG tablet   Other Relevant Orders   Lipid panel (Completed)   Hepatic function panel (Completed)   Hypertension    Blood pressure as outlined.  Change to micardis '80mg'$  q day.  Stay hydrated.  Follow pressures.  Check metabolic panel.  Get her back in soon to reassess.        Relevant Medications   telmisartan (MICARDIS) 80 MG tablet   Near syncope - Primary    Dicussed staying hydrating  and eating regular meals.  EKG obtained and revealed SR with no acute ischemic changes.  No chest pain, but has noticed some increased sob with exertion.  Also issues with increased palpitations.  Will check routine labs including tsh.  Discussed further w/up.  Will refer to cardiology for further evaluation and treatment.  Blood pressure as outlined.  Will change micardis/hctz to Micardis 22m q day.  Follow pressures.  Get her back in soon to reassess.        Relevant Medications   telmisartan (MICARDIS) 80 MG tablet   Other Relevant Orders   EKG 12-Lead (Completed)   Ambulatory referral to Cardiology   Obesity (BMI 30-39.9)    She has started low carb diet.  Follow.        Stress    Discussed with her today.  Does not feel she needs anything more at this time.  Follow.         Other Visit Diagnoses    Palpitations        Relevant Orders    EKG 12-Lead (Completed)    Ambulatory referral to Cardiology        SEinar Pheasant MD

## 2015-08-22 NOTE — Progress Notes (Signed)
Order placed for f/u lab.   

## 2015-08-23 ENCOUNTER — Encounter: Payer: Self-pay | Admitting: Internal Medicine

## 2015-08-23 NOTE — Assessment & Plan Note (Signed)
Low carb diet and exercise.  Follow met b and a1c.   

## 2015-08-23 NOTE — Assessment & Plan Note (Signed)
She has started low carb diet.  Follow.

## 2015-08-23 NOTE — Assessment & Plan Note (Signed)
Discussed with her today.  Does not feel she needs anything more at this time.  Follow.

## 2015-08-23 NOTE — Assessment & Plan Note (Signed)
EKG as outlined.  Check routine labs as outlined.  W/up planned as outliend.

## 2015-08-23 NOTE — Assessment & Plan Note (Signed)
Low cholesterol diet and exercise.  Follow lipid panel.   

## 2015-08-23 NOTE — Assessment & Plan Note (Signed)
Blood pressure as outlined.  Change to micardis 80mg  q day.  Stay hydrated.  Follow pressures.  Check metabolic panel.  Get her back in soon to reassess.

## 2015-08-23 NOTE — Assessment & Plan Note (Addendum)
Dicussed staying hydrating and eating regular meals.  EKG obtained and revealed SR with no acute ischemic changes.  No chest pain, but has noticed some increased sob with exertion.  Also issues with increased palpitations.  Will check routine labs including tsh.  Discussed further w/up.  Will refer to cardiology for further evaluation and treatment.  Blood pressure as outlined.  Will change micardis/hctz to Micardis 80mg  q day.  Follow pressures.  Get her back in soon to reassess.

## 2015-09-13 ENCOUNTER — Other Ambulatory Visit (INDEPENDENT_AMBULATORY_CARE_PROVIDER_SITE_OTHER): Payer: Medicare Other

## 2015-09-13 LAB — HEPATIC FUNCTION PANEL
ALBUMIN: 4.3 g/dL (ref 3.5–5.2)
ALK PHOS: 47 U/L (ref 39–117)
ALT: 15 U/L (ref 0–35)
AST: 17 U/L (ref 0–37)
Bilirubin, Direct: 0.2 mg/dL (ref 0.0–0.3)
TOTAL PROTEIN: 7 g/dL (ref 6.0–8.3)
Total Bilirubin: 1.4 mg/dL — ABNORMAL HIGH (ref 0.2–1.2)

## 2015-09-15 ENCOUNTER — Encounter: Payer: Self-pay | Admitting: Internal Medicine

## 2015-10-25 ENCOUNTER — Other Ambulatory Visit: Payer: Self-pay | Admitting: Internal Medicine

## 2015-11-15 ENCOUNTER — Encounter: Payer: Self-pay | Admitting: Internal Medicine

## 2015-11-15 ENCOUNTER — Ambulatory Visit (INDEPENDENT_AMBULATORY_CARE_PROVIDER_SITE_OTHER): Payer: Medicare Other | Admitting: Internal Medicine

## 2015-11-15 VITALS — BP 168/88 | HR 64 | Temp 97.9°F | Ht 64.5 in | Wt 188.2 lb

## 2015-11-15 DIAGNOSIS — Z0000 Encounter for general adult medical examination without abnormal findings: Secondary | ICD-10-CM

## 2015-11-15 DIAGNOSIS — I10 Essential (primary) hypertension: Secondary | ICD-10-CM

## 2015-11-15 DIAGNOSIS — E669 Obesity, unspecified: Secondary | ICD-10-CM

## 2015-11-15 DIAGNOSIS — E119 Type 2 diabetes mellitus without complications: Secondary | ICD-10-CM

## 2015-11-15 DIAGNOSIS — F439 Reaction to severe stress, unspecified: Secondary | ICD-10-CM

## 2015-11-15 DIAGNOSIS — E7800 Pure hypercholesterolemia, unspecified: Secondary | ICD-10-CM

## 2015-11-15 DIAGNOSIS — Z86010 Personal history of colonic polyps: Secondary | ICD-10-CM

## 2015-11-15 NOTE — Assessment & Plan Note (Signed)
Physical today 11/15/15.  Mammogram 03/09/15 - Birads I.  Colonoscopy 04/06/14 - normal.  Recommended f/u in five years.

## 2015-11-15 NOTE — Progress Notes (Signed)
Pre visit review using our clinic review tool, if applicable. No additional management support is needed unless otherwise documented below in the visit note. 

## 2015-11-15 NOTE — Progress Notes (Signed)
Patient ID: Lori Guzman, female   DOB: 08-May-1940, 75 y.o.   MRN: [id number]   Subjective:    Patient ID: Lori Guzman, female    DOB: 22-Sep-1940, 75 y.o.   MRN: [id number]  HPI  Patient here for her physical exam. She reports she is doing relatively well.  Tries to stay active.  No cardiac symptoms with increased activity or exertion.  Breathing stable.  No acid reflux.  No abdominal pain or cramping.  Bowels stable.  Overall she feels she is handling stress well.     Past Medical History:  Diagnosis Date  . Allergy    reoccuring problems  . GERD (gastroesophageal reflux disease)   . History of colon polyps    adenomatous  . Hypertension    Past Surgical History:  Procedure Laterality Date  . BARTHOLIN GLAND CYST EXCISION    . CHOLECYSTECTOMY    . [place]  . TONSILECTOMY/ADENOIDECTOMY WITH MYRINGOTOMY  1950   Family History  Problem Relation Age of Onset  . Heart disease Mother   . CVA Mother   . Hypertension Mother   . Alzheimer's disease Mother   . Esophageal cancer Father   . Breast cancer Neg Hx   . Colon cancer Neg Hx    Social History   Social History  . Marital status: Married    Spouse name: N/A  . Number of children: N/A  . Years of education: N/A   Social History Main Topics  . Smoking status: Never Smoker  . Smokeless tobacco: Never Used  . Alcohol use No  . Drug use: No  . Sexual activity: Not Asked   Other Topics Concern  . None   Social History Narrative   She is married, has 3 children and is retired.    Outpatient Encounter Prescriptions as of 11/15/2015  Medication Sig  . aspirin EC 81 MG tablet Take 81 mg by mouth daily.  Marland Kitchen escitalopram (LEXAPRO) 10 MG tablet TAKE 1 TABLET BY MOUTH EVERY DAY  . telmisartan (MICARDIS) 80 MG tablet TAKE 1 TABLET (80 MG TOTAL) BY MOUTH DAILY.   No facility-administered encounter medications on file as of 11/15/2015.     Review of Systems  Constitutional: Negative  for appetite change and unexpected weight change.  HENT: Negative for congestion and sinus pressure.   Eyes: Negative for pain and visual disturbance.  Respiratory: Negative for cough, chest tightness and shortness of breath.   Cardiovascular: Negative for chest pain, palpitations and leg swelling.  Gastrointestinal: Negative for abdominal pain, diarrhea, nausea and vomiting.  Genitourinary: Negative for difficulty urinating and dysuria.  Musculoskeletal: Negative for back pain and joint swelling.  Skin: Negative for color change and rash.  Neurological: Negative for dizziness, light-headedness and headaches.  Hematological: Negative for adenopathy. Does not bruise/bleed easily.  Psychiatric/Behavioral: Negative for agitation and dysphoric mood.       Objective:     Blood pressure rechecked by me:  142/82  Physical Exam  Constitutional: She is oriented to person, place, and time. She appears well-developed and well-nourished. No distress.  HENT:  Nose: Nose normal.  Mouth/Throat: Oropharynx is clear and moist.  Eyes: Right eye exhibits no discharge. Left eye exhibits no discharge. No scleral icterus.  Neck: Neck supple. No thyromegaly present.  Cardiovascular: Normal rate and regular rhythm.   Pulmonary/Chest: Breath sounds normal. No accessory muscle usage. No tachypnea. No respiratory distress. She has no decreased breath sounds. She has no wheezes. She has  no rhonchi. Right breast exhibits no inverted nipple, no mass, no nipple discharge and no tenderness (no axillary adenopathy). Left breast exhibits no inverted nipple, no mass, no nipple discharge and no tenderness (no axilarry adenopathy).  Abdominal: Soft. Bowel sounds are normal. There is no tenderness.  Musculoskeletal: She exhibits no edema or tenderness.  Lymphadenopathy:    She has no cervical adenopathy.  Neurological: She is alert and oriented to person, place, and time.  Skin: Skin is warm. No rash noted. No erythema.    Psychiatric: She has a normal mood and affect. Her behavior is normal.    BP (!) 168/88   Pulse 64   Temp 97.9 F (36.6 C) (Oral)   Ht 5' 4.5" (1.638 m)   Wt 188 lb 3.2 oz (85.4 kg)   LMP 02/29/1992   SpO2 97%   BMI 31.81 kg/m  Wt Readings from Last 3 Encounters:  11/15/15 188 lb 3.2 oz (85.4 kg)  08/22/15 188 lb 8 oz (85.5 kg)  12/09/14 189 lb (85.7 kg)     Lab Results  Component Value Date   WBC 6.6 08/22/2015   HGB 13.1 08/22/2015   HCT 39.1 08/22/2015   PLT 256.0 08/22/2015   GLUCOSE 107 (H) 08/22/2015   CHOL 181 08/22/2015   TRIG 62.0 08/22/2015   HDL 53.00 08/22/2015   LDLCALC 115 (H) 08/22/2015   ALT 15 09/13/2015   AST 17 09/13/2015   NA 141 08/22/2015   K 4.2 08/22/2015   CL 105 08/22/2015   CREATININE 0.88 08/22/2015   BUN 27 (H) 08/22/2015   CO2 29 08/22/2015   TSH 2.34 08/22/2015   HGBA1C 6.4 08/22/2015   MICROALBUR 1.5 08/22/2015    Mm Screening Breast Tomo Bilateral  Result Date: 03/09/2015 CLINICAL DATA:  Screening. EXAM: DIGITAL SCREENING BILATERAL MAMMOGRAM WITH 3D TOMO WITH CAD COMPARISON:  Previous exam(s). ACR Breast Density Category b: There are scattered areas of fibroglandular density. FINDINGS: There are no findings suspicious for malignancy. Images were processed with CAD. IMPRESSION: No mammographic evidence of malignancy. A result letter of this screening mammogram will be mailed directly to the patient. RECOMMENDATION: Screening mammogram in one year. (Code:SM-B-01Y) BI-RADS CATEGORY  1: Negative. Electronically Signed   By: Lillia Mountain M.D.   On: 03/09/2015 15:05       Assessment & Plan:   Problem List Items Addressed This Visit    Diabetes ([place])    Low carb diet and exercise.  Follow met b and a1c.       Relevant Orders   Hemoglobin A1c   Health care maintenance    Physical today 11/15/15.  Mammogram 03/09/15 - Birads I.  Colonoscopy 04/06/14 - normal.  Recommended f/u in five years.        History of colonic polyps     Colonoscopy 04/06/14 normal.  Recommended f/u colonoscopy in five years.       Hypercholesterolemia    Low cholesterol diet and exercise.  Follow lipid panel.  Lab Results  Component Value Date   CHOL 181 08/22/2015   HDL 53.00 08/22/2015   LDLCALC 115 (H) 08/22/2015   TRIG 62.0 08/22/2015   CHOLHDL 3 08/22/2015        Relevant Orders   Hepatic function panel   Lipid panel   Hypertension    Blood pressure elevated.  Recheck improved.  She will spot check her pressure.  Get her back in soon to reassess.  Follow.       Relevant Orders  Basic metabolic panel   Obesity (BMI 30-39.9)    Diet and exercise.  Follow.       Stress    Handling stress relatively well.  Follow.         Other Visit Diagnoses    Routine general medical examination at a health care facility    -  Primary       Einar Pheasant, MD

## 2015-11-27 ENCOUNTER — Encounter: Payer: Self-pay | Admitting: Internal Medicine

## 2015-11-27 NOTE — Assessment & Plan Note (Signed)
Colonoscopy 04/06/14 normal.  Recommended f/u colonoscopy in five years.

## 2015-11-27 NOTE — Assessment & Plan Note (Signed)
Low cholesterol diet and exercise.  Follow lipid panel.  Lab Results  Component Value Date   CHOL 181 08/22/2015   HDL 53.00 08/22/2015   LDLCALC 115 (H) 08/22/2015   TRIG 62.0 08/22/2015   CHOLHDL 3 08/22/2015

## 2015-11-27 NOTE — Assessment & Plan Note (Signed)
Diet and exercise.  Follow.  

## 2015-11-27 NOTE — Assessment & Plan Note (Signed)
Blood pressure elevated.  Recheck improved.  She will spot check her pressure.  Get her back in soon to reassess.  Follow.

## 2015-11-27 NOTE — Assessment & Plan Note (Signed)
Handling stress relatively well.  Follow.

## 2015-11-27 NOTE — Assessment & Plan Note (Signed)
Low carb diet and exercise.  Follow met b and a1c.  

## 2015-12-03 ENCOUNTER — Other Ambulatory Visit: Payer: Self-pay | Admitting: Internal Medicine

## 2015-12-11 ENCOUNTER — Other Ambulatory Visit: Payer: Self-pay | Admitting: Internal Medicine

## 2015-12-15 ENCOUNTER — Other Ambulatory Visit: Payer: Self-pay

## 2015-12-15 MED ORDER — TELMISARTAN 80 MG PO TABS: 80.0000 mg | ORAL_TABLET | Freq: Every day | ORAL | 1 refills | Status: DC

## 2016-01-03 ENCOUNTER — Other Ambulatory Visit (INDEPENDENT_AMBULATORY_CARE_PROVIDER_SITE_OTHER): Payer: Medicare Other

## 2016-01-03 DIAGNOSIS — E7800 Pure hypercholesterolemia, unspecified: Secondary | ICD-10-CM | POA: Diagnosis not present

## 2016-01-03 DIAGNOSIS — E119 Type 2 diabetes mellitus without complications: Secondary | ICD-10-CM | POA: Diagnosis not present

## 2016-01-03 DIAGNOSIS — I10 Essential (primary) hypertension: Secondary | ICD-10-CM

## 2016-01-03 LAB — HEPATIC FUNCTION PANEL
ALK PHOS: 60 U/L (ref 39–117)
ALT: 12 U/L (ref 0–35)
AST: 12 U/L (ref 0–37)
Albumin: 4.5 g/dL (ref 3.5–5.2)
BILIRUBIN DIRECT: 0.2 mg/dL (ref 0.0–0.3)
BILIRUBIN TOTAL: 1.6 mg/dL — AB (ref 0.2–1.2)
TOTAL PROTEIN: 6.9 g/dL (ref 6.0–8.3)

## 2016-01-03 LAB — BASIC METABOLIC PANEL
BUN: 14 mg/dL (ref 6–23)
CO2: 34 mEq/L — ABNORMAL HIGH (ref 19–32)
Calcium: 9.9 mg/dL (ref 8.4–10.5)
Chloride: 103 mEq/L (ref 96–112)
Creatinine, Ser: 0.86 mg/dL (ref 0.40–1.20)
GFR: 68.46 mL/min (ref >60.00)
GLUCOSE: 100 mg/dL — AB (ref 70–99)
POTASSIUM: 4.6 meq/L (ref 3.5–5.1)
SODIUM: 143 meq/L (ref 135–145)

## 2016-01-03 LAB — LIPID PANEL
CHOLESTEROL: 179 mg/dL (ref 0–200)
HDL: 63.7 mg/dL (ref >39.00)
LDL Cholesterol: 98 mg/dL (ref 0–99)
NonHDL: 115.41
Total CHOL/HDL Ratio: 3
Triglycerides: 87 mg/dL (ref 0.0–149.0)
VLDL: 17.4 mg/dL (ref 0.0–40.0)

## 2016-01-03 LAB — HEMOGLOBIN A1C: HEMOGLOBIN A1C: 6.4 % (ref 4.6–6.5)

## 2016-01-09 ENCOUNTER — Ambulatory Visit (INDEPENDENT_AMBULATORY_CARE_PROVIDER_SITE_OTHER): Payer: Medicare Other | Admitting: Internal Medicine

## 2016-01-09 ENCOUNTER — Encounter: Payer: Self-pay | Admitting: Internal Medicine

## 2016-01-09 DIAGNOSIS — M542 Cervicalgia: Secondary | ICD-10-CM

## 2016-01-09 DIAGNOSIS — I10 Essential (primary) hypertension: Secondary | ICD-10-CM

## 2016-01-09 DIAGNOSIS — E7800 Pure hypercholesterolemia, unspecified: Secondary | ICD-10-CM

## 2016-01-09 DIAGNOSIS — E669 Obesity, unspecified: Secondary | ICD-10-CM | POA: Diagnosis not present

## 2016-01-09 DIAGNOSIS — E119 Type 2 diabetes mellitus without complications: Secondary | ICD-10-CM

## 2016-01-09 DIAGNOSIS — F439 Reaction to severe stress, unspecified: Secondary | ICD-10-CM | POA: Diagnosis not present

## 2016-01-09 MED ORDER — HYDROCHLOROTHIAZIDE 25 MG PO TABS: 25.0000 mg | ORAL_TABLET | Freq: Every day | ORAL | 1 refills | Status: DC

## 2016-01-09 NOTE — Progress Notes (Signed)
Patient ID: Lori Guzman, female   DOB: [birth year], 75 y.o.   MRN: [id number]   Subjective:    Patient ID: Lori Guzman, female    DOB: 1940/03/30, 75 y.o.   MRN: [id number]  HPI  Patient here for a scheduled follow up.  She reports she is doing relatively well.  Handling stress well.  Saw Dr Nehemiah Massed 09/20/15.  Had normal stress test.  Holter monitor - some bradycardia.  No significant arrhythmia.  She reports no chest pain.  No sob.  No acid reflux.  No abdominal pain or cramping.  Bowels stable.  Some discomfort in her neck.  Present over the last week.  Aggravated with certain movements.  Taking tylenol arthritis.  Is better.  Blood pressure elevated.     Past Medical History:  Diagnosis Date  . Allergy    reoccuring problems  . GERD (gastroesophageal reflux disease)   . History of colon polyps    adenomatous  . Hypertension    Past Surgical History:  Procedure Laterality Date  . BARTHOLIN GLAND CYST EXCISION    . CHOLECYSTECTOMY    . [place]  . TONSILECTOMY/ADENOIDECTOMY WITH MYRINGOTOMY  1950   Family History  Problem Relation Age of Onset  . Heart disease Mother   . CVA Mother   . Hypertension Mother   . Alzheimer's disease Mother   . Esophageal cancer Father   . Breast cancer Neg Hx   . Colon cancer Neg Hx    Social History   Social History  . Marital status: Married    Spouse name: N/A  . Number of children: N/A  . Years of education: N/A   Social History Main Topics  . Smoking status: Never Smoker  . Smokeless tobacco: Never Used  . Alcohol use No  . Drug use: No  . Sexual activity: Not Asked   Other Topics Concern  . None   Social History Narrative   She is married, has 3 children and is retired.    Outpatient Encounter Prescriptions as of 01/09/2016  Medication Sig  . aspirin EC 81 MG tablet Take 81 mg by mouth daily.  Marland Kitchen escitalopram (LEXAPRO) 10 MG tablet TAKE 1 TABLET BY MOUTH EVERY DAY  . telmisartan  (MICARDIS) 80 MG tablet Take 1 tablet (80 mg total) by mouth daily.  . hydrochlorothiazide (HYDRODIURIL) 25 MG tablet Take 1 tablet (25 mg total) by mouth daily.   No facility-administered encounter medications on file as of 01/09/2016.     Review of Systems  Constitutional: Negative for appetite change and unexpected weight change.  HENT: Negative for congestion and sinus pressure.   Respiratory: Negative for cough, chest tightness and shortness of breath.   Cardiovascular: Negative for chest pain, palpitations and leg swelling.  Gastrointestinal: Negative for abdominal pain, diarrhea, nausea and vomiting.  Genitourinary: Negative for difficulty urinating and dysuria.  Musculoskeletal: Positive for neck pain. Negative for back pain.  Skin: Negative for color change and rash.  Neurological: Negative for dizziness, light-headedness and headaches.  Psychiatric/Behavioral: Negative for agitation and dysphoric mood.       Objective:     Blood pressure rechecked by me:  384-536/46  Physical Exam  Constitutional: She appears well-developed and well-nourished. No distress.  HENT:  Nose: Nose normal.  Mouth/Throat: Oropharynx is clear and moist.  Neck: Neck supple. No thyromegaly present.  Cardiovascular: Normal rate and regular rhythm.   Pulmonary/Chest: Breath sounds normal. No respiratory distress. She has no wheezes.  Abdominal: Soft. Bowel sounds are normal. There is no tenderness.  Musculoskeletal: She exhibits no edema or tenderness.  Lymphadenopathy:    She has no cervical adenopathy.  Skin: No rash noted. No erythema.  Psychiatric: She has a normal mood and affect. Her behavior is normal.    BP (!) 170/90   Pulse 60   Temp 98.2 F (36.8 C) (Oral)   Ht _0  (1.651 m)   Wt 191 lb 3.2 oz (86.7 kg)   LMP 02/29/1992   SpO2 97%   BMI 31.82 kg/m  Wt Readings from Last 3 Encounters:  01/09/16 191 lb 3.2 oz (86.7 kg)  11/15/15 188 lb 3.2 oz (85.4 kg)  08/22/15 188 lb 8  oz (85.5 kg)     Lab Results  Component Value Date   WBC 6.6 08/22/2015   HGB 13.1 08/22/2015   HCT 39.1 08/22/2015   PLT 256.0 08/22/2015   GLUCOSE 100 (H) 01/03/2016   CHOL 179 01/03/2016   TRIG 87.0 01/03/2016   HDL 63.70 01/03/2016   LDLCALC 98 01/03/2016   ALT 12 01/03/2016   AST 12 01/03/2016   NA 143 01/03/2016   K 4.6 01/03/2016   CL 103 01/03/2016   CREATININE 0.86 01/03/2016   BUN 14 01/03/2016   CO2 34 (H) 01/03/2016   TSH 2.34 08/22/2015   HGBA1C 6.4 01/03/2016   MICROALBUR 1.5 08/22/2015    Mm Screening Breast Tomo Bilateral  Result Date: 03/09/2015 CLINICAL DATA:  Screening. EXAM: DIGITAL SCREENING BILATERAL MAMMOGRAM WITH 3D TOMO WITH CAD COMPARISON:  Previous exam(s). ACR Breast Density Category b: There are scattered areas of fibroglandular density. FINDINGS: There are no findings suspicious for malignancy. Images were processed with CAD. IMPRESSION: No mammographic evidence of malignancy. A result letter of this screening mammogram will be mailed directly to the patient. RECOMMENDATION: Screening mammogram in one year. (Code:SM-B-01Y) BI-RADS CATEGORY  1: Negative. Electronically Signed   By: Lillia Mountain M.D.   On: 03/09/2015 15:05       Assessment & Plan:   Problem List Items Addressed This Visit    Diabetes ([place])    Low carb diet and exercise.  Follow met b and a1c.        Hypercholesterolemia    Low cholesterol diet and exercise.  Follow lipid panel.        Relevant Medications   hydrochlorothiazide (HYDRODIURIL) 25 MG tablet   Hypertension    Blood pressure elevated.  Will add hctz to her regimen.  Follow pressures.  Follow metabolic panel.        Relevant Medications   hydrochlorothiazide (HYDRODIURIL) 25 MG tablet   Neck pain    Taking tylenol.  Just taking occasionally.  Can take scheduled.  Call with update or if persistent.        Obesity (BMI 30-39.9)    Discussed diet and exercise.  Follow.        Stress    Handling stress  relatively well.  Follow.            Einar Pheasant, MD

## 2016-01-09 NOTE — Progress Notes (Signed)
Pre visit review using our clinic review tool, if applicable. No additional management support is needed unless otherwise documented below in the visit note. 

## 2016-01-15 ENCOUNTER — Encounter: Payer: Self-pay | Admitting: Internal Medicine

## 2016-01-15 DIAGNOSIS — M542 Cervicalgia: Secondary | ICD-10-CM | POA: Insufficient documentation

## 2016-01-15 NOTE — Assessment & Plan Note (Signed)
Low carb diet and exercise.  Follow met b and a1c.   

## 2016-01-15 NOTE — Assessment & Plan Note (Signed)
Blood pressure elevated.  Will add hctz to her regimen.  Follow pressures.  Follow metabolic panel.

## 2016-01-15 NOTE — Assessment & Plan Note (Signed)
Handling stress relatively well.  Follow.

## 2016-01-15 NOTE — Assessment & Plan Note (Signed)
Discussed diet and exercise.  Follow.  

## 2016-01-15 NOTE — Assessment & Plan Note (Signed)
Taking tylenol.  Just taking occasionally.  Can take scheduled.  Call with update or if persistent.

## 2016-01-15 NOTE — Assessment & Plan Note (Signed)
Low cholesterol diet and exercise.  Follow lipid panel.   

## 2016-01-31 ENCOUNTER — Telehealth: Payer: Self-pay | Admitting: Internal Medicine

## 2016-01-31 NOTE — Telephone Encounter (Signed)
Pt declined AWV at this time. Thank you!

## 2016-02-02 ENCOUNTER — Encounter: Payer: Self-pay | Admitting: Internal Medicine

## 2016-02-02 ENCOUNTER — Ambulatory Visit (INDEPENDENT_AMBULATORY_CARE_PROVIDER_SITE_OTHER): Payer: Medicare Other | Admitting: Internal Medicine

## 2016-02-02 DIAGNOSIS — I10 Essential (primary) hypertension: Secondary | ICD-10-CM

## 2016-02-02 DIAGNOSIS — E7800 Pure hypercholesterolemia, unspecified: Secondary | ICD-10-CM | POA: Diagnosis not present

## 2016-02-02 DIAGNOSIS — R42 Dizziness and giddiness: Secondary | ICD-10-CM

## 2016-02-02 DIAGNOSIS — E119 Type 2 diabetes mellitus without complications: Secondary | ICD-10-CM

## 2016-02-02 NOTE — Progress Notes (Signed)
Patient ID: Lori Guzman, female   DOB: 1940/03/09, 75 y.o.   MRN: [id number]   Subjective:    Patient ID: Lori Guzman, female    DOB: 14-Jun-1940, 75 y.o.   MRN: [id number]  HPI  Patient here for a scheduled follow up.  Here to f/u on her blood pressure.  Last visit, HCTZ was added back to her regime secondary to persistent elevated blood pressure.  She has been tolerating the hctz.  Blood pressure has been doing better.  Her outside checks averaging 120-130s/60s.  She does report that starting 8 days ago, she had a leg cramp.  Raised up fast and then rolled over on her right side.  The room started spinning.  She has had vertigo previously.  Has ben worked up by ENT previously.  Diagnosed with vertigo.  She reports her current symptoms are the same as previous.  She continues to have dizziness.  States notices is worse when she lies down - if she rolls over on her right side.  Has to be careful with her movements when she is up.  No headache.  Some previous brief ear pain.  None now.  Minimal nasal congestion.  No significant change/symptoms.  Previous episode of emesis with the room spinning.  None now.  No chest pain.  No sob.  No acid reflux.  No abdominal pain or cramping.     Past Medical History:  Diagnosis Date  . Allergy    reoccuring problems  . GERD (gastroesophageal reflux disease)   . History of colon polyps    adenomatous  . Hypertension    Past Surgical History:  Procedure Laterality Date  . BARTHOLIN GLAND CYST EXCISION    . CHOLECYSTECTOMY    . [place]  . TONSILECTOMY/ADENOIDECTOMY WITH MYRINGOTOMY  1950   Family History  Problem Relation Age of Onset  . Heart disease Mother   . CVA Mother   . Hypertension Mother   . Alzheimer's disease Mother   . Esophageal cancer Father   . Breast cancer Neg Hx   . Colon cancer Neg Hx    Social History   Social History  . Marital status: Married    Spouse name: N/A  . Number of  children: N/A  . Years of education: N/A   Social History Main Topics  . Smoking status: Never Smoker  . Smokeless tobacco: Never Used  . Alcohol use No  . Drug use: No  . Sexual activity: Not Asked   Other Topics Concern  . None   Social History Narrative   She is married, has 3 children and is retired.    Outpatient Encounter Prescriptions as of 02/02/2016  Medication Sig  . aspirin EC 81 MG tablet Take 81 mg by mouth daily.  Marland Kitchen escitalopram (LEXAPRO) 10 MG tablet TAKE 1 TABLET BY MOUTH EVERY DAY  . hydrochlorothiazide (HYDRODIURIL) 25 MG tablet Take 1 tablet (25 mg total) by mouth daily.  Marland Kitchen telmisartan (MICARDIS) 80 MG tablet Take 1 tablet (80 mg total) by mouth daily.   No facility-administered encounter medications on file as of 02/02/2016.     Review of Systems  Constitutional: Negative for appetite change and unexpected weight change.  HENT: Negative for sinus pain and sore throat.        Minimal congestion - nasal.  Previous brief ear pain.  None currently.    Respiratory: Negative for cough, chest tightness and shortness of breath.   Cardiovascular: Negative for  chest pain, palpitations and leg swelling.  Gastrointestinal: Negative for abdominal pain, diarrhea, nausea and vomiting.  Genitourinary: Negative for difficulty urinating and dysuria.  Musculoskeletal: Negative for joint swelling and myalgias.  Skin: Negative for color change and rash.  Neurological: Positive for dizziness. Negative for headaches.  Psychiatric/Behavioral: Negative for agitation and dysphoric mood.       Objective:     Blood pressure rechecked by me:  136/68  Physical Exam  Constitutional: She appears well-developed and well-nourished. No distress.  HENT:  Nose: Nose normal.  Mouth/Throat: Oropharynx is clear and moist.  TMs without erythema  Neck: Neck supple. No thyromegaly present.  Cardiovascular: Normal rate and regular rhythm.   Pulmonary/Chest: Breath sounds normal. No  respiratory distress. She has no wheezes.  Abdominal: Soft. Bowel sounds are normal. There is no tenderness.  Musculoskeletal: She exhibits no edema or tenderness.  Lymphadenopathy:    She has no cervical adenopathy.  Neurological:  Reproducible dizziness with lying flat and looking right and then looking at the ceiling.  Some reproducible dizziness with going from lying to sitting position.   Skin: No rash noted. No erythema.  Psychiatric: She has a normal mood and affect. Her behavior is normal.    BP 136/64   Pulse 69   Temp 98.3 F (36.8 C) (Oral)   Ht 5' 5"  (1.651 m)   Wt 189 lb 6.4 oz (85.9 kg)   LMP 02/29/1992   SpO2 97%   BMI 31.52 kg/m  Wt Readings from Last 3 Encounters:  02/02/16 189 lb 6.4 oz (85.9 kg)  01/09/16 191 lb 3.2 oz (86.7 kg)  11/15/15 188 lb 3.2 oz (85.4 kg)     Lab Results  Component Value Date   WBC 6.6 08/22/2015   HGB 13.1 08/22/2015   HCT 39.1 08/22/2015   PLT 256.0 08/22/2015   GLUCOSE 100 (H) 01/03/2016   CHOL 179 01/03/2016   TRIG 87.0 01/03/2016   HDL 63.70 01/03/2016   LDLCALC 98 01/03/2016   ALT 12 01/03/2016   AST 12 01/03/2016   NA 143 01/03/2016   K 4.6 01/03/2016   CL 103 01/03/2016   CREATININE 0.86 01/03/2016   BUN 14 01/03/2016   CO2 34 (H) 01/03/2016   TSH 2.34 08/22/2015   HGBA1C 6.4 01/03/2016   MICROALBUR 1.5 08/22/2015    Mm Screening Breast Tomo Bilateral  Result Date: 03/09/2015 CLINICAL DATA:  Screening. EXAM: DIGITAL SCREENING BILATERAL MAMMOGRAM WITH 3D TOMO WITH CAD COMPARISON:  Previous exam(s). ACR Breast Density Category b: There are scattered areas of fibroglandular density. FINDINGS: There are no findings suspicious for malignancy. Images were processed with CAD. IMPRESSION: No mammographic evidence of malignancy. A result letter of this screening mammogram will be mailed directly to the patient. RECOMMENDATION: Screening mammogram in one year. (Code:SM-B-01Y) BI-RADS CATEGORY  1: Negative. Electronically  Signed   By: Lillia Mountain M.D.   On: 03/09/2015 15:05       Assessment & Plan:   Problem List Items Addressed This Visit    Diabetes ([place])    Low carb diet and exercise.  Follow met b and a1c.       Dizziness    Has a history of vertigo.  States previously evaluated and worked up by ENT (Dr Pryor Ochoa).  Is now followed by Dr Kathyrn Sheriff.  Has been a while since she has had a "flare".  Now with persistent dizziness as outlined.  Feels similar to her previous episode.  Given persistent dizziness/vertigo, will have  ENT evaluate and treat - epley maneuvers.  Need appt asap given pt is continuing to have symptoms.        Hypercholesterolemia    Low cholesterol diet and exercise.  Follow lipid panel.        Hypertension    Blood pressure doing better on current regimen.  Follow pressures.  Follow metabolic panel.            Einar Pheasant, MD

## 2016-02-02 NOTE — Progress Notes (Signed)
Pre visit review using our clinic review tool, if applicable. No additional management support is needed unless otherwise documented below in the visit note. 

## 2016-02-03 ENCOUNTER — Encounter: Payer: Self-pay | Admitting: Internal Medicine

## 2016-02-03 ENCOUNTER — Other Ambulatory Visit: Payer: Self-pay | Admitting: Surgical

## 2016-02-03 ENCOUNTER — Other Ambulatory Visit: Payer: Self-pay | Admitting: Internal Medicine

## 2016-02-03 DIAGNOSIS — R42 Dizziness and giddiness: Secondary | ICD-10-CM

## 2016-02-03 MED ORDER — HYDROCHLOROTHIAZIDE 25 MG PO TABS: 25.0000 mg | ORAL_TABLET | Freq: Every day | ORAL | 0 refills | Status: DC

## 2016-02-03 NOTE — Assessment & Plan Note (Signed)
Has a history of vertigo.  States previously evaluated and worked up by ENT (Dr Pryor Ochoa).  Is now followed by Dr Kathyrn Sheriff.  Has been a while since she has had a "flare".  Now with persistent dizziness as outlined.  Feels similar to her previous episode.  Given persistent dizziness/vertigo, will have ENT evaluate and treat - epley maneuvers.  Need appt asap given pt is continuing to have symptoms.

## 2016-02-03 NOTE — Progress Notes (Signed)
Order placed for referral to ent

## 2016-02-03 NOTE — Assessment & Plan Note (Signed)
Blood pressure doing better on current regimen.  Follow pressures.  Follow metabolic panel.  

## 2016-02-03 NOTE — Progress Notes (Signed)
Patient request 90 day supply. Sent to pharmacy

## 2016-02-03 NOTE — Assessment & Plan Note (Signed)
Low carb diet and exercise.  Follow met b and a1c.  

## 2016-02-03 NOTE — Assessment & Plan Note (Signed)
Low cholesterol diet and exercise.  Follow lipid panel.   

## 2016-02-14 ENCOUNTER — Telehealth: Payer: Self-pay

## 2016-02-14 ENCOUNTER — Ambulatory Visit
Admission: EM | Admit: 2016-02-14 | Discharge: 2016-02-14 | Disposition: A | Discharge status: Home / Self Care | Payer: Medicare Other

## 2016-02-14 DIAGNOSIS — F419 Anxiety disorder, unspecified: Secondary | ICD-10-CM

## 2016-02-14 DIAGNOSIS — I10 Essential (primary) hypertension: Secondary | ICD-10-CM

## 2016-02-14 NOTE — Telephone Encounter (Signed)
Lori Guzman Dr Kathyrn Sheriff [phone] calls states patient was there for appointment and patient BP was 220/100 at first chec.  They waited and let  Patient be seen  and re-checked patients BP came down to 200/80 and resting.   I informed Lori Billings RN that Dr. Nicki Reaper was off today and we didn't have any available appointments and patient should get evaluated especially since patient was planning on going out of town on Friday.   She then informed me patients Blood pressure was re-checked and it was 160/80. I told Lori Billings, RN from D Juengel's office that patient should be evaluated at [facility] or [facility] for further evaluation of blood pressure.   I spoke with patient and she agreed to be seen at urgent care for evaluation today.   The patient was planning on going to [facility] for further evaluation.   I informed the patient that I would pass this information onto Dr Nicki Reaper for review.

## 2016-02-14 NOTE — ED Triage Notes (Addendum)
Pt was upstairs seeing Dr Myna Hidalgo and her BP was elevated, 220/100, 200/80, 160/80. Here it is 163/52, and she has had vertigo for 3 weeks but hasn't seen anyone yet. She is very nausea. She is so stressed out and she stays dizzy.

## 2016-02-14 NOTE — ED Provider Notes (Signed)
CSN: [id number]     Arrival date & time 02/14/16  1204 History   First MD Initiated Contact with Patient 02/14/16 1437     Chief Complaint  Patient presents with  . Hypertension   (Consider location/radiation/quality/duration/timing/severity/associated sxs/prior Treatment) HPI  This a 76 year old female who seen because of hypertension that was found she was at [place] office being examined for vertigo. Her blood pressures at that time were 220/100 200/80 and 160/80. He has known essential hypertension being treated Dr. 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 She was on to state that she isn't a great deal of stress because of the inability to be seen during the Christmas holidays due to schedules and also some stressors at home. She has not had any visual disturbances or other neurological dysfunction. She has not missed any of her blood pressure medications.       Past Medical History:  Diagnosis Date  . Allergy    reoccuring problems  . GERD (gastroesophageal reflux disease)   . History of colon polyps    adenomatous  . Hypertension    Past Surgical History:  Procedure Laterality Date  . BARTHOLIN GLAND CYST EXCISION    . CHOLECYSTECTOMY    . [place]  . TONSILECTOMY/ADENOIDECTOMY WITH MYRINGOTOMY  1950   Family History  Problem Relation Age of Onset  . Heart disease Mother   . CVA Mother   . Hypertension Mother   . Alzheimer's disease Mother   . Esophageal cancer Father   . Breast cancer Neg Hx   . Colon cancer Neg Hx    Social History  Substance Use Topics  . Smoking status: Never Smoker  . Smokeless tobacco: Never Used  . Alcohol use No   OB History    No data available     Review of Systems  Constitutional: Positive for activity change. Negative for chills, fatigue and fever.  Neurological: Positive for dizziness.  All other systems  reviewed and are negative.   Allergies  Patient has no known allergies.  Home Medications   Prior to Admission medications   Medication Sig Start Date End Date Taking? Authorizing Provider  aspirin EC 81 MG tablet Take 81 mg by mouth daily.   Yes Historical Provider, MD  escitalopram (LEXAPRO) 10 MG tablet TAKE 1 TABLET BY MOUTH EVERY DAY 12/05/15  Yes Einar Pheasant, MD  hydrochlorothiazide (HYDRODIURIL) 25 MG tablet Take 1 tablet (25 mg total) by mouth daily. 02/03/16  Yes Einar Pheasant, MD  telmisartan (MICARDIS) 80 MG tablet Take 1 tablet (80 mg total) by mouth daily. 12/15/15  Yes Einar Pheasant, MD   Meds Ordered and Administered this Visit  Medications - No data to display  BP (!) 163/52 (BP Location: Left Arm)   Pulse 78   Resp 18   Ht 5\' 5"  (1.651 m)   Wt 189 lb (85.7 kg)   LMP 02/29/1992   SpO2 98%   BMI 31.45 kg/m  No data found.   Physical Exam  Constitutional: She is oriented to person, place, and time. She appears well-developed and well-nourished. No distress.  HENT:  Head: Normocephalic and atraumatic.  Right Ear: External ear normal.  Left Ear: External ear normal.  Mouth/Throat: Oropharynx is clear and moist.  Eyes: EOM are normal. Pupils are equal, round, and reactive to light.  Neck: Normal range of motion. Neck supple.  Cardiovascular:  Recheck of blood pressure manually after interviewing with the patient having her sit for 10 minutes. Right arm showed 174/64.  Pulmonary/Chest: Effort normal and breath sounds normal. No respiratory distress. She has no wheezes. She has no rales.  Musculoskeletal: Normal range of motion.  Neurological: She is alert and oriented to person, place, and time.  Skin: Skin is warm and dry. She is not diaphoretic.  Psychiatric: She has a normal mood and affect. Her behavior is normal. Judgment and thought content normal.  Nursing note and vitals reviewed.   Urgent Care Course   Clinical Course     Procedures  (including critical care time)  Labs Review Labs Reviewed - No data to display  Imaging Review No results found.   Visual Acuity Review  Right Eye Distance:   Left Eye Distance:   Bilateral Distance:    Right Eye Near:   Left Eye Near:    Bilateral Near:         MDM   1. Essential hypertension   2. Anxiety    Talk with the patient regarding her blood pressure elevation experienced at the ENT office. Told her the dynamics of blood pressures and the effect of anxiety and stress on her blood pressure. At the present time her blood pressures have decreased over what was found at the ENT office visit the patient is any danger or any urgency at this time. She feels better that she is now under the care of an ENT to address her vertigo which in the past that she had a severe attack and the memories have exacerbated this episode. She continues to keep a journal of her blood pressures for Dr. Nicki Reaper and I am encouraged her to do this. She will remember to take her medications as usual will hydrate better keeping her urinate clear to pale yellow color and take the meclizine which Dr.Juengle as recommended. Follow-up with Dr. Nicki Reaper in the very near future discuss today's incident.    Lorin Picket, PA-C 02/14/16 1540

## 2016-02-15 NOTE — Telephone Encounter (Signed)
Patient advised of below and verbalized an understanding  

## 2016-02-15 NOTE — Telephone Encounter (Signed)
Thank you for the update.  Sounds like her pressure is better.  If she needs me to see her prior to her vacation, let me know.  Thanks

## 2016-02-15 NOTE — Telephone Encounter (Signed)
Spoke with patient she stated that at urgent care Blood pressure was 170/70 when MD checked it .   She stayed there for awhile and drank a couple of glasses of water . They think it maybe due to she was frustrated it took so long it took so long for her to get an appointment at Dr Maisie Fus office and she was frustrated with vertigo issues.   Urgent care MD released her without making any changes to medication.She stated when she got home from urgent care she checked Blood pressure and it was 126/65.     She plans on going on vacation.   She will come back from vacation and have testing done for Meinere's disease.   Patient advised to make sure she drinks adequate fluid intake she verbalized understanding.    She will continue to monitor blood pressure .

## 2016-02-15 NOTE — Telephone Encounter (Signed)
Reviewed.  Agree with evaluation.  It does appear she went to urgent care.  Please call and confirm pt doing ok.

## 2016-03-06 ENCOUNTER — Other Ambulatory Visit: Payer: Self-pay | Admitting: *Deleted

## 2016-03-06 MED ORDER — HYDROCHLOROTHIAZIDE 25 MG PO TABS: 25.0000 mg | ORAL_TABLET | Freq: Every day | ORAL | 1 refills | Status: DC

## 2016-03-19 ENCOUNTER — Other Ambulatory Visit: Payer: Self-pay | Admitting: Otolaryngology

## 2016-03-19 DIAGNOSIS — R42 Dizziness and giddiness: Secondary | ICD-10-CM

## 2016-03-20 ENCOUNTER — Ambulatory Visit: Payer: Medicare Other | Admitting: Internal Medicine

## 2016-03-29 ENCOUNTER — Ambulatory Visit: Payer: Medicare Other

## 2016-04-24 ENCOUNTER — Encounter: Payer: Self-pay | Admitting: Family Medicine

## 2016-04-24 ENCOUNTER — Ambulatory Visit (INDEPENDENT_AMBULATORY_CARE_PROVIDER_SITE_OTHER): Payer: Medicare Other | Admitting: Family Medicine

## 2016-04-24 ENCOUNTER — Telehealth: Payer: Self-pay | Admitting: *Deleted

## 2016-04-24 VITALS — BP 140/66 | HR 85 | Temp 100.3°F | Wt 193.6 lb

## 2016-04-24 DIAGNOSIS — J101 Influenza due to other identified influenza virus with other respiratory manifestations: Secondary | ICD-10-CM | POA: Diagnosis not present

## 2016-04-24 DIAGNOSIS — R059 Cough, unspecified: Secondary | ICD-10-CM

## 2016-04-24 DIAGNOSIS — R05 Cough: Secondary | ICD-10-CM

## 2016-04-24 LAB — POCT INFLUENZA A/B
Influenza A, POC: POSITIVE — AB
Influenza B, POC: NEGATIVE

## 2016-04-24 MED ORDER — OSELTAMIVIR PHOSPHATE 75 MG PO CAPS: 75.0000 mg | ORAL_CAPSULE | Freq: Two times a day (BID) | ORAL | 0 refills | Status: DC

## 2016-04-24 MED ORDER — BENZONATATE 200 MG PO CAPS: 200.0000 mg | ORAL_CAPSULE | Freq: Two times a day (BID) | ORAL | 0 refills | Status: DC | PRN

## 2016-04-24 NOTE — Progress Notes (Signed)
Pre visit review using our clinic review tool, if applicable. No additional management support is needed unless otherwise documented below in the visit note. 

## 2016-04-24 NOTE — Telephone Encounter (Signed)
Reason for call: Symptoms: scratchy throat, headache, congestion chest, non productive cough, chills , fatigue  Duration Sunday night,  Medications: Tylenol ,thera Flu  Patient scheduled for appointment today for evaluation  .

## 2016-04-24 NOTE — Progress Notes (Signed)
  Tommi Rumps, MD Phone: [phone]  Lori Guzman is a 76 y.o. female who presents today for same-day visit.  Patient notes onset of symptoms 3 days ago. Started with a sore throat on the right side and then developed a raspy cough. Has had some chest and sinus congestion. Notes some chills and sweats as well. Has not checked her temperature. Notes no shortness of breath though notes her breathing is not quite normal either. Her ears hurt. Her cough is nonproductive. Has had some mild headache with this. No sudden onset headache. No vision changes, numbness, or weakness. Does have sick contact in her granddaughter who was diagnosed with influenza over the weekend.  PMH: nonsmoker.   ROS see history of present illness  Objective  Physical Exam Vitals:   04/24/16 1311  BP: 140/66  Pulse: 85  Temp: 100.3 F (37.9 C)    BP Readings from Last 3 Encounters:  04/24/16 140/66  02/14/16 (!) 163/52  02/02/16 136/64   Wt Readings from Last 3 Encounters:  04/24/16 193 lb 9.6 oz (87.8 kg)  02/14/16 189 lb (85.7 kg)  02/02/16 189 lb 6.4 oz (85.9 kg)    Physical Exam  Constitutional: No distress.  HENT:  Head: Normocephalic and atraumatic.  Normal TMs bilaterally, mild posterior oropharyngeal erythema with no exudate  Eyes: Conjunctivae are normal. Pupils are equal, round, and reactive to light.  Neck: Neck supple.  Cardiovascular: Normal rate, regular rhythm and normal heart sounds.   Pulmonary/Chest: Effort normal and breath sounds normal.  Lymphadenopathy:    She has no cervical adenopathy.  Neurological: She is alert. Gait normal.  Skin: Skin is warm and dry. She is not diaphoretic.     Assessment/Plan: Please see individual problem list.  Influenza A Symptoms consistent with influenza. Influenza A test positive. Treat with Tamiflu. Tessalon for cough. Given return precautions.   Orders Placed This Encounter  Procedures  . POCT Influenza A/B    Meds  ordered this encounter  Medications  . oseltamivir (TAMIFLU) 75 MG capsule    Sig: Take 1 capsule (75 mg total) by mouth 2 (two) times daily.    Dispense:  10 capsule    Refill:  0  . benzonatate (TESSALON) 200 MG capsule    Sig: Take 1 capsule (200 mg total) by mouth 2 (two) times daily as needed for cough.    Dispense:  20 capsule    Refill:  0    Tommi Rumps, MD [place]

## 2016-04-24 NOTE — Patient Instructions (Signed)
Nice to meet you. You have influenza. We'll treat with Tamiflu. You can take the Tessalon for cough. If you develop shortness of breath, cough productive of blood, or any new or change in symptoms please seek medical attention medially.

## 2016-04-24 NOTE — Telephone Encounter (Signed)
Pt stated that she feels as if she has the flu: she currently has symptoms of; headache,congestion , cough , fatigue and chills. Pt contact [phone]

## 2016-04-24 NOTE — Assessment & Plan Note (Signed)
Symptoms consistent with influenza. Influenza A test positive. Treat with Tamiflu. Tessalon for cough. Given return precautions.

## 2016-04-26 ENCOUNTER — Telehealth: Payer: Self-pay | Admitting: Internal Medicine

## 2016-04-26 NOTE — Telephone Encounter (Signed)
Spoke with the patient, she has been on the tamiflu for the past three days and only on hte first day did she vomit.  Since then approximately 75mins after taking the tamiflu she has watery diarrhea and stomach cramps.  She is willing to remain on the tamiflu as the "flu" symptoms have gotten better.  She is less congested and feels a lot better.  I encouraged fluids to keep her from getting dehydrated and she agreed.  She appreciated the return call and will let us know if she needs anything else.  Declined the zofran at this time.

## 2016-04-26 NOTE — Telephone Encounter (Signed)
Pt called and stated that every time that she take the oseltamivir (TAMIFLU) 75 MG capsule she gets very hot, diarrhea, stomach cramps. Should pt continuing taking this medication. Please advise, thank you!Q  Call pt @ [phone]

## 2016-04-26 NOTE — Telephone Encounter (Signed)
tamiflu can cause GI side effects.  How is she doing overall?  (from the flu).  If doing better and feels better, may have to stop.  I can also call in some zofran for nausea and see if this helps her to take the medication.

## 2016-04-26 NOTE — Telephone Encounter (Signed)
Patient DX with Flu on 04/24/16 given Tamiflu BY PCP ,patient states every time she takes this medication she gets upper abdominal cramping, with loose stools and feels very hot ( patient has not checked temp) but stated its like hot flashes not fever. Patient would like to know should she stop the tamiflu?

## 2016-04-27 NOTE — Telephone Encounter (Signed)
Late entry.  Called pt 04/26/16 and discussed current symptoms and condition.  No diarrhea today.  She has eaten.  No vomiting.  Starting to feel better.  Will complete tamiflu.  Call if problems.

## 2016-05-01 ENCOUNTER — Telehealth: Payer: Self-pay | Admitting: Internal Medicine

## 2016-05-01 NOTE — Telephone Encounter (Signed)
Pt called and stated that she thinks that she has a bladder infection. C/o burning while urinating and frequent urination. She is taking some Azo but thinks that she needs more. Can we call in an rx? Please advise, thank you!  Call pt @ [phone]

## 2016-05-01 NOTE — Telephone Encounter (Signed)
Spoke with patient states she is being treated for UTI was prescribed antibiotic. She will let us know if she needs anything.

## 2016-05-01 NOTE — Telephone Encounter (Signed)
Noted.  Tell her to let us know if she needs anything.

## 2016-05-01 NOTE — Telephone Encounter (Signed)
Patient calls stating she is having dysuria X 1 day she is out of town.  She is taking AZO OTC.   She will seek treatment at urgent care where she is at out of town.  Patient will follow up if needed.

## 2016-05-25 ENCOUNTER — Encounter: Payer: Self-pay | Admitting: Internal Medicine

## 2016-05-25 ENCOUNTER — Ambulatory Visit (INDEPENDENT_AMBULATORY_CARE_PROVIDER_SITE_OTHER): Payer: Medicare Other | Admitting: Internal Medicine

## 2016-05-25 VITALS — BP 144/62 | HR 64 | Temp 98.6°F | Resp 12 | Ht 65.0 in | Wt 191.0 lb

## 2016-05-25 DIAGNOSIS — Z1231 Encounter for screening mammogram for malignant neoplasm of breast: Secondary | ICD-10-CM | POA: Diagnosis not present

## 2016-05-25 DIAGNOSIS — E669 Obesity, unspecified: Secondary | ICD-10-CM | POA: Diagnosis not present

## 2016-05-25 DIAGNOSIS — N959 Unspecified menopausal and perimenopausal disorder: Secondary | ICD-10-CM

## 2016-05-25 DIAGNOSIS — Z1239 Encounter for other screening for malignant neoplasm of breast: Secondary | ICD-10-CM

## 2016-05-25 DIAGNOSIS — I10 Essential (primary) hypertension: Secondary | ICD-10-CM | POA: Diagnosis not present

## 2016-05-25 DIAGNOSIS — E119 Type 2 diabetes mellitus without complications: Secondary | ICD-10-CM

## 2016-05-25 DIAGNOSIS — R350 Frequency of micturition: Secondary | ICD-10-CM | POA: Diagnosis not present

## 2016-05-25 DIAGNOSIS — R42 Dizziness and giddiness: Secondary | ICD-10-CM

## 2016-05-25 DIAGNOSIS — F439 Reaction to severe stress, unspecified: Secondary | ICD-10-CM | POA: Diagnosis not present

## 2016-05-25 DIAGNOSIS — E7800 Pure hypercholesterolemia, unspecified: Secondary | ICD-10-CM

## 2016-05-25 LAB — BASIC METABOLIC PANEL
BUN: 15 mg/dL (ref 6–23)
CHLORIDE: 104 meq/L (ref 96–112)
CO2: 30 meq/L (ref 19–32)
CREATININE: 0.84 mg/dL (ref 0.40–1.20)
Calcium: 9.6 mg/dL (ref 8.4–10.5)
GFR: 70.27 mL/min (ref >60.00)
Glucose, Bld: 111 mg/dL — ABNORMAL HIGH (ref 70–99)
Potassium: 4.4 mEq/L (ref 3.5–5.1)
SODIUM: 140 meq/L (ref 135–145)

## 2016-05-25 LAB — HEPATIC FUNCTION PANEL
ALBUMIN: 4.3 g/dL (ref 3.5–5.2)
ALK PHOS: 46 U/L (ref 39–117)
ALT: 15 U/L (ref 0–35)
AST: 18 U/L (ref 0–37)
Bilirubin, Direct: 0.2 mg/dL (ref 0.0–0.3)
TOTAL PROTEIN: 7 g/dL (ref 6.0–8.3)
Total Bilirubin: 1.2 mg/dL (ref 0.2–1.2)

## 2016-05-25 LAB — URINALYSIS, ROUTINE W REFLEX MICROSCOPIC
Bilirubin Urine: NEGATIVE
HGB URINE DIPSTICK: NEGATIVE
KETONES UR: NEGATIVE
LEUKOCYTES UA: NEGATIVE
Nitrite: NEGATIVE
RBC / HPF: NONE SEEN (ref 0–?)
Specific Gravity, Urine: 1.01 (ref 1.000–1.030)
Total Protein, Urine: NEGATIVE
UROBILINOGEN UA: 0.2 (ref 0.0–1.0)
Urine Glucose: NEGATIVE
pH: 7.5 (ref 5.0–8.0)

## 2016-05-25 LAB — LIPID PANEL
Cholesterol: 174 mg/dL (ref 0–200)
HDL: 53.4 mg/dL (ref >39.00)
LDL Cholesterol: 102 mg/dL — ABNORMAL HIGH (ref 0–99)
NONHDL: 121.02
Total CHOL/HDL Ratio: 3
Triglycerides: 94 mg/dL (ref 0.0–149.0)
VLDL: 18.8 mg/dL (ref 0.0–40.0)

## 2016-05-25 LAB — HEMOGLOBIN A1C: Hgb A1c MFr Bld: 6.3 % (ref 4.6–6.5)

## 2016-05-25 NOTE — Progress Notes (Signed)
Pre-visit discussion using our clinic review tool. No additional management support is needed unless otherwise documented below in the visit note.  

## 2016-05-25 NOTE — Progress Notes (Signed)
Patient ID: Lori Guzman, female   DOB: 1940-10-28, 76 y.o.   MRN: [id number]   Subjective:    Patient ID: Lori Guzman, female    DOB: April 07, 1940, 76 y.o.   MRN: [id number]  HPI  Patient here for a scheduled follow up.  She states that 10 days ago, she was working out in the yard.  Started having problems with sciatica flare.  Taking tylenol arthritis.  Is some better.  Discussed stretches.  She also reports some increased urinary frequency.  No dysuria.  Some nocturia.  Was seen at outside urgent care.  Treated with macrobid.  Still with some nocturia.  No chest pain.  Breathing stable.  She decreased her hctz to 1/2 tablet because of the urinary frequency.  Discussed her sugars.  Not checking her sugars regluarly.  Agreed to diabetes education.     Past Medical History:  Diagnosis Date  . Allergy    reoccuring problems  . GERD (gastroesophageal reflux disease)   . History of colon polyps    adenomatous  . Hypertension    Past Surgical History:  Procedure Laterality Date  . BARTHOLIN GLAND CYST EXCISION    . CHOLECYSTECTOMY    . [place]  . TONSILECTOMY/ADENOIDECTOMY WITH MYRINGOTOMY  1950   Family History  Problem Relation Age of Onset  . Heart disease Mother   . CVA Mother   . Hypertension Mother   . Alzheimer's disease Mother   . Esophageal cancer Father   . Breast cancer Neg Hx   . Colon cancer Neg Hx    Social History   Social History  . Marital status: Married    Spouse name: N/A  . Number of children: N/A  . Years of education: N/A   Social History Main Topics  . Smoking status: Never Smoker  . Smokeless tobacco: Never Used  . Alcohol use No  . Drug use: No  . Sexual activity: Not Asked   Other Topics Concern  . None   Social History Narrative   She is married, has 3 children and is retired.    Outpatient Encounter Prescriptions as of 05/25/2016  Medication Sig  . aspirin EC 81 MG tablet Take 81 mg by mouth daily.  Marland Kitchen  escitalopram (LEXAPRO) 10 MG tablet TAKE 1 TABLET BY MOUTH EVERY DAY  . hydrochlorothiazide (HYDRODIURIL) 25 MG tablet Take 1 tablet (25 mg total) by mouth daily.  Marland Kitchen telmisartan (MICARDIS) 80 MG tablet Take 1 tablet (80 mg total) by mouth daily.  . [DISCONTINUED] benzonatate (TESSALON) 200 MG capsule Take 1 capsule (200 mg total) by mouth 2 (two) times daily as needed for cough.  . [DISCONTINUED] oseltamivir (TAMIFLU) 75 MG capsule Take 1 capsule (75 mg total) by mouth 2 (two) times daily.   No facility-administered encounter medications on file as of 05/25/2016.     Review of Systems  Constitutional: Negative for appetite change and unexpected weight change.  HENT: Negative for congestion and sinus pressure.   Respiratory: Negative for cough, chest tightness and shortness of breath.   Cardiovascular: Negative for chest pain, palpitations and leg swelling.  Gastrointestinal: Negative for abdominal pain, diarrhea, nausea and vomiting.  Genitourinary: Positive for frequency. Negative for difficulty urinating.       Nocturia  Musculoskeletal: Positive for back pain. Negative for joint swelling.       Sciatic pain  Skin: Negative for color change and rash.  Neurological: Negative for headaches.  Dizziness better.  States was told had fluid - right ear.    Psychiatric/Behavioral: Negative for agitation and dysphoric mood.       Objective:    Physical Exam  Constitutional: She appears well-developed and well-nourished. No distress.  HENT:  Nose: Nose normal.  Mouth/Throat: Oropharynx is clear and moist.  Neck: Neck supple. No thyromegaly present.  Cardiovascular: Normal rate and regular rhythm.   Pulmonary/Chest: Breath sounds normal. No respiratory distress. She has no wheezes.  Abdominal: Soft. Bowel sounds are normal. There is no tenderness.  Musculoskeletal: She exhibits no edema or tenderness.  Lymphadenopathy:    She has no cervical adenopathy.  Skin: No rash noted. No  erythema.  Psychiatric: She has a normal mood and affect. Her behavior is normal.    BP (!) 144/62 (BP Location: Left Arm, Patient Position: Sitting, Cuff Size: Normal)   Pulse 64   Temp 98.6 F (37 C) (Oral)   Resp 12   Ht 5' 5"  (1.651 m)   Wt 191 lb (86.6 kg)   LMP 02/29/1992   SpO2 95%   BMI 31.78 kg/m  Wt Readings from Last 3 Encounters:  05/25/16 191 lb (86.6 kg)  04/24/16 193 lb 9.6 oz (87.8 kg)  02/14/16 189 lb (85.7 kg)     Lab Results  Component Value Date   WBC 6.6 08/22/2015   HGB 13.1 08/22/2015   HCT 39.1 08/22/2015   PLT 256.0 08/22/2015   GLUCOSE 111 (H) 05/25/2016   CHOL 174 05/25/2016   TRIG 94.0 05/25/2016   HDL 53.40 05/25/2016   LDLCALC 102 (H) 05/25/2016   ALT 15 05/25/2016   AST 18 05/25/2016   NA 140 05/25/2016   K 4.4 05/25/2016   CL 104 05/25/2016   CREATININE 0.84 05/25/2016   BUN 15 05/25/2016   CO2 30 05/25/2016   TSH 2.34 08/22/2015   HGBA1C 6.3 05/25/2016   MICROALBUR 1.5 08/22/2015       Assessment & Plan:   Problem List Items Addressed This Visit    Diabetes ([employer])    Diet and exercise.  Follow met b and a1c.  Discussed referral for nutrition education.  Pt in agreement.        Relevant Orders   Hemoglobin A1c (Completed)   Basic metabolic panel (Completed)   Amb ref to Medical Nutrition Therapy-MNT   Dizziness    Better.  Saw Dr Kathyrn Sheriff.  Wants to hold on MRI.        Hypercholesterolemia    Low cholesterol diet and exercise.  Follow lipid panel.        Relevant Orders   Hepatic function panel (Completed)   Lipid panel (Completed)   Hypertension - Primary    Blood pressure overall improved.  Slightly elevated.  Have her start back on her hctz - one whole tablet daily.  Follow pressures.  Follow metabolic panel.        Obesity (BMI 30-39.9)    Diet and exercise.  Follow.       Stress    handlng stress relatively well.  On lexapro.  Follow.        Other Visit Diagnoses    Screening for breast cancer        Relevant Orders   MM DIGITAL SCREENING BILATERAL   Menopausal and postmenopausal disorder       Relevant Orders   DG Bone Density   Urinary frequency       Confirm no infection.     Relevant  Orders   Urinalysis, Routine w reflex microscopic (Completed)   Urine Culture (Completed)       Einar Pheasant, MD

## 2016-05-26 LAB — URINE CULTURE: ORGANISM ID, BACTERIA: NO GROWTH

## 2016-05-27 ENCOUNTER — Encounter: Payer: Self-pay | Admitting: Internal Medicine

## 2016-05-27 NOTE — Assessment & Plan Note (Signed)
Low cholesterol diet and exercise.  Follow lipid panel.   

## 2016-05-27 NOTE — Assessment & Plan Note (Signed)
Better.  Saw Dr Kathyrn Sheriff.  Wants to hold on MRI.

## 2016-05-27 NOTE — Assessment & Plan Note (Signed)
Diet and exercise.  Follow.  

## 2016-05-27 NOTE — Assessment & Plan Note (Addendum)
handlng stress relatively well.  On lexapro.  Follow.

## 2016-05-27 NOTE — Assessment & Plan Note (Signed)
Blood pressure overall improved.  Slightly elevated.  Have her start back on her hctz - one whole tablet daily.  Follow pressures.  Follow metabolic panel.

## 2016-05-27 NOTE — Assessment & Plan Note (Signed)
Diet and exercise.  Follow met b and a1c.  Discussed referral for nutrition education.  Pt in agreement.

## 2016-06-11 ENCOUNTER — Telehealth: Payer: Self-pay | Admitting: Internal Medicine

## 2016-06-11 NOTE — Telephone Encounter (Signed)
Left pt message asking to call Allison back directly at [phone] to schedule AWV. Thanks! °

## 2016-06-28 ENCOUNTER — Ambulatory Visit
Admission: RE | Admit: 2016-06-28 | Discharge: 2016-06-28 | Disposition: A | Discharge status: Home / Self Care | Payer: Medicare Other | Source: Ambulatory Visit | Attending: Internal Medicine | Admitting: Internal Medicine

## 2016-06-28 DIAGNOSIS — Z1231 Encounter for screening mammogram for malignant neoplasm of breast: Secondary | ICD-10-CM | POA: Diagnosis not present

## 2016-06-28 DIAGNOSIS — N959 Unspecified menopausal and perimenopausal disorder: Secondary | ICD-10-CM | POA: Diagnosis present

## 2016-06-28 DIAGNOSIS — M85852 Other specified disorders of bone density and structure, left thigh: Secondary | ICD-10-CM | POA: Diagnosis not present

## 2016-06-28 DIAGNOSIS — Z1239 Encounter for other screening for malignant neoplasm of breast: Secondary | ICD-10-CM

## 2016-06-29 ENCOUNTER — Other Ambulatory Visit: Payer: Self-pay | Admitting: Internal Medicine

## 2016-06-29 ENCOUNTER — Telehealth: Payer: Self-pay | Admitting: Internal Medicine

## 2016-06-29 NOTE — Telephone Encounter (Signed)
See lab message 

## 2016-06-29 NOTE — Telephone Encounter (Signed)
Pt called back returning your call. Please advise, thank you!  Call pt @ [phone]

## 2016-07-12 NOTE — Telephone Encounter (Signed)
Left pt message asking to call Allison back directly at [phone] to schedule AWV. Thanks! °

## 2016-07-13 ENCOUNTER — Other Ambulatory Visit: Payer: Self-pay | Admitting: Internal Medicine

## 2016-08-13 ENCOUNTER — Ambulatory Visit: Payer: Medicare Other | Admitting: Internal Medicine

## 2016-10-31 ENCOUNTER — Ambulatory Visit (INDEPENDENT_AMBULATORY_CARE_PROVIDER_SITE_OTHER): Payer: Medicare Other | Admitting: Internal Medicine

## 2016-10-31 ENCOUNTER — Encounter: Payer: Self-pay | Admitting: Internal Medicine

## 2016-10-31 VITALS — BP 136/80 | HR 62 | Temp 98.6°F | Resp 12 | Ht 65.0 in | Wt 188.8 lb

## 2016-10-31 DIAGNOSIS — F439 Reaction to severe stress, unspecified: Secondary | ICD-10-CM | POA: Diagnosis not present

## 2016-10-31 DIAGNOSIS — Z6831 Body mass index (BMI) 31.0-31.9, adult: Secondary | ICD-10-CM

## 2016-10-31 DIAGNOSIS — I10 Essential (primary) hypertension: Secondary | ICD-10-CM

## 2016-10-31 DIAGNOSIS — E7800 Pure hypercholesterolemia, unspecified: Secondary | ICD-10-CM | POA: Diagnosis not present

## 2016-10-31 DIAGNOSIS — E119 Type 2 diabetes mellitus without complications: Secondary | ICD-10-CM | POA: Diagnosis not present

## 2016-10-31 DIAGNOSIS — Z9109 Other allergy status, other than to drugs and biological substances: Secondary | ICD-10-CM | POA: Diagnosis not present

## 2016-10-31 LAB — BASIC METABOLIC PANEL
BUN: 15 mg/dL (ref 6–23)
CHLORIDE: 102 meq/L (ref 96–112)
CO2: 30 mEq/L (ref 19–32)
Calcium: 9.8 mg/dL (ref 8.4–10.5)
Creatinine, Ser: 0.87 mg/dL (ref 0.40–1.20)
GFR: 67.4 mL/min (ref >60.00)
GLUCOSE: 105 mg/dL — AB (ref 70–99)
POTASSIUM: 4.3 meq/L (ref 3.5–5.1)
SODIUM: 140 meq/L (ref 135–145)

## 2016-10-31 LAB — CBC WITH DIFFERENTIAL/PLATELET
BASOS PCT: 0.7 % (ref 0.0–3.0)
Basophils Absolute: 0 10*3/uL (ref 0.0–0.1)
EOS ABS: 0.2 10*3/uL (ref 0.0–0.7)
Eosinophils Relative: 3.8 % (ref 0.0–5.0)
HCT: 37.8 % (ref 36.0–46.0)
Hemoglobin: 12.7 g/dL (ref 12.0–15.0)
LYMPHS ABS: 2.2 10*3/uL (ref 0.7–4.0)
Lymphocytes Relative: 35.3 % (ref 12.0–46.0)
MCHC: 33.5 g/dL (ref 30.0–36.0)
MCV: 92.6 fl (ref 78.0–100.0)
MONO ABS: 0.5 10*3/uL (ref 0.1–1.0)
Monocytes Relative: 7.6 % (ref 3.0–12.0)
NEUTROS ABS: 3.2 10*3/uL (ref 1.4–7.7)
NEUTROS PCT: 52.6 % (ref 43.0–77.0)
PLATELETS: 254 10*3/uL (ref 150.0–400.0)
RBC: 4.08 Mil/uL (ref 3.87–5.11)
RDW: 12.9 % (ref 11.5–15.5)
WBC: 6.1 10*3/uL (ref 4.0–10.5)

## 2016-10-31 LAB — LIPID PANEL
CHOL/HDL RATIO: 3
Cholesterol: 180 mg/dL (ref 0–200)
HDL: 65.2 mg/dL (ref >39.00)
LDL Cholesterol: 99 mg/dL (ref 0–99)
NONHDL: 114.95
Triglycerides: 79 mg/dL (ref 0.0–149.0)
VLDL: 15.8 mg/dL (ref 0.0–40.0)

## 2016-10-31 LAB — HEPATIC FUNCTION PANEL
ALT: 11 U/L (ref 0–35)
AST: 13 U/L (ref 0–37)
Albumin: 4.2 g/dL (ref 3.5–5.2)
Alkaline Phosphatase: 53 U/L (ref 39–117)
BILIRUBIN DIRECT: 0.2 mg/dL (ref 0.0–0.3)
BILIRUBIN TOTAL: 1.4 mg/dL — AB (ref 0.2–1.2)
TOTAL PROTEIN: 6.7 g/dL (ref 6.0–8.3)

## 2016-10-31 LAB — MICROALBUMIN / CREATININE URINE RATIO
Creatinine,U: 93.1 mg/dL
MICROALB UR: 0.7 mg/dL (ref 0.0–1.9)
MICROALB/CREAT RATIO: 0.8 mg/g (ref 0.0–30.0)

## 2016-10-31 LAB — HEMOGLOBIN A1C: Hgb A1c MFr Bld: 6.3 % (ref 4.6–6.5)

## 2016-10-31 LAB — TSH: TSH: 2.19 u[IU]/mL (ref 0.35–4.50)

## 2016-10-31 NOTE — Progress Notes (Signed)
Patient ID: Lori Guzman, female   DOB: 03/30/1940, 76 y.o.   MRN: [id number]   Subjective:    Patient ID: Lori Guzman, female    DOB: [birth year], 76 y.o.   MRN: [id number]  HPI  Patient here for a scheduled follow up.  She mowed yesterday.  Some allergy symptoms with minimal sinus headache.  No significant headache.  Some minimal congestion.  Stays active.  No chest pain.  No sob.  No acid reflux.  No abdominal pain.  Bowels moving.  Some increased stress.  Discussed with her today.  She feels she is handling things relatively well.  Has lost some weight.  Has stopped eating sweets.  Discussed elevated sugar and diagnosis of diabetes.     Past Medical History:  Diagnosis Date  . Allergy    reoccuring problems  . GERD (gastroesophageal reflux disease)   . History of colon polyps    adenomatous  . Hypertension    Past Surgical History:  Procedure Laterality Date  . BARTHOLIN GLAND CYST EXCISION    . CHOLECYSTECTOMY    . [place]  . TONSILECTOMY/ADENOIDECTOMY WITH MYRINGOTOMY  1950   Family History  Problem Relation Age of Onset  . Heart disease Mother   . CVA Mother   . Hypertension Mother   . Alzheimer's disease Mother   . Esophageal cancer Father   . Breast cancer Neg Hx   . Colon cancer Neg Hx    Social History   Social History  . Marital status: Married    Spouse name: N/A  . Number of children: N/A  . Years of education: N/A   Social History Main Topics  . Smoking status: Never Smoker  . Smokeless tobacco: Never Used  . Alcohol use No  . Drug use: No  . Sexual activity: Not Asked   Other Topics Concern  . None   Social History Narrative   She is married, has 3 children and is retired.    Outpatient Encounter Prescriptions as of 10/31/2016  Medication Sig  . aspirin EC 81 MG tablet Take 81 mg by mouth daily.  Marland Kitchen escitalopram (LEXAPRO) 10 MG tablet TAKE 1 TABLET BY MOUTH EVERY DAY  . telmisartan (MICARDIS) 80 MG tablet TAKE 1  TABLET (80 MG TOTAL) BY MOUTH DAILY.  . [DISCONTINUED] hydrochlorothiazide (HYDRODIURIL) 25 MG tablet Take 1 tablet (25 mg total) by mouth daily.   No facility-administered encounter medications on file as of 10/31/2016.     Review of Systems  Constitutional: Negative for appetite change and unexpected weight change.  HENT: Positive for congestion. Negative for sinus pressure.   Respiratory: Negative for cough, chest tightness and shortness of breath.   Cardiovascular: Negative for chest pain, palpitations and leg swelling.  Gastrointestinal: Negative for abdominal pain, diarrhea, nausea and vomiting.  Genitourinary: Negative for difficulty urinating and dysuria.  Musculoskeletal: Negative for joint swelling and myalgias.  Skin: Negative for color change and rash.  Neurological: Negative for dizziness and light-headedness.       No significant headache.    Psychiatric/Behavioral: Negative for agitation and dysphoric mood.       Objective:    Physical Exam  Constitutional: She appears well-developed and well-nourished. No distress.  HENT:  Nose: Nose normal.  Mouth/Throat: Oropharynx is clear and moist.  Neck: Neck supple. No thyromegaly present.  Cardiovascular: Normal rate and regular rhythm.   Pulmonary/Chest: Breath sounds normal. No respiratory distress. She has no wheezes.  Abdominal: Soft. Bowel  sounds are normal. There is no tenderness.  Musculoskeletal: She exhibits no edema or tenderness.  Lymphadenopathy:    She has no cervical adenopathy.  Skin: No rash noted. No erythema.  Psychiatric: She has a normal mood and affect. Her behavior is normal.    BP 136/80 (BP Location: Left Arm, Patient Position: Sitting, Cuff Size: Normal)   Pulse 62   Temp 98.6 F (37 C) (Oral)   Resp 12   Ht 5\' 5"  (1.651 m)   Wt 188 lb 12.8 oz (85.6 kg)   LMP 02/29/1992   SpO2 96%   BMI 31.42 kg/m  Wt Readings from Last 3 Encounters:  10/31/16 188 lb 12.8 oz (85.6 kg)  05/25/16 191 lb  (86.6 kg)  04/24/16 193 lb 9.6 oz (87.8 kg)     Lab Results  Component Value Date   WBC 6.1 10/31/2016   HGB 12.7 10/31/2016   HCT 37.8 10/31/2016   PLT 254.0 10/31/2016   GLUCOSE 105 (H) 10/31/2016   CHOL 180 10/31/2016   TRIG 79.0 10/31/2016   HDL 65.20 10/31/2016   LDLCALC 99 10/31/2016   ALT 11 10/31/2016   AST 13 10/31/2016   NA 140 10/31/2016   K 4.3 10/31/2016   CL 102 10/31/2016   CREATININE 0.87 10/31/2016   BUN 15 10/31/2016   CO2 30 10/31/2016   TSH 2.19 10/31/2016   HGBA1C 6.3 10/31/2016   MICROALBUR 0.7 10/31/2016    Dg Bone Density  Result Date: 06/28/2016 EXAM: DUAL X-RAY ABSORPTIOMETRY (DXA) FOR BONE MINERAL DENSITY IMPRESSION: Dear Dr. Einar Pheasant, Your patient Lori Guzman completed a FRAX assessment on 06/28/2016 using the [place] (analysis version: 14.10) manufactured by [employer]. The following summarizes the results of our evaluation. PATIENT BIOGRAPHICAL: Name: Lori Guzman Patient ID: [id number] Birth Date: Apr 28, 1940 Height:    64.0 in. Gender:     Female    [age]       Weight:    190.2 lbs. Ethnicity:  White                            Exam Date: 06/28/2016 FRAX* RESULTS:  (version: 3.5) 10-year Probability of Fracture1 Major Osteoporotic Fracture2 Hip Fracture 10.5% 2.0% Population: [place] (Caucasian) Risk Factors: None Based on Femur (Left) Neck BMD 1 -The 10-year probability of fracture may be lower than reported if the patient has received treatment. 2 -Major Osteoporotic Fracture: Clinical Spine, Forearm, Hip or Shoulder *FRAX is a Materials engineer of the [employer] for Metabolic Bone Disease, a [place] (WHO) [facility]. ASSESSMENT: The probability of a major osteoporotic fracture is 10.5% within the next ten years. The probability of a hip fracture is 2.0% within the next ten years. . Dear Dr. Einar Pheasant, Your patient Lori Guzman completed a BMD  test on 06/28/2016 using the [place] (analysis version: 14.10) manufactured by [employer]. The following summarizes the results of our evaluation. PATIENT BIOGRAPHICAL: Name: Sulma, Ruffino Patient ID: [id number] Birth Date: September 06, 1940 Height: 64.0 in. Gender: Female Exam Date: 06/28/2016 Weight: 190.2 lbs. Indications: Advanced Age, Caucasian, Height Loss, Postmenopausal Fractures: Treatments: ASPRIN 81 MG, Calcium, lexapro ASSESSMENT: The BMD measured at Femur Neck Left is 0.834 g/cm2 with a T-score of -1.5. This patient is considered osteopenic according to [facility]) criteria. Site Region Measured Measured WHO Young Adult BMD Date  Age      Classification T-score AP Spine L1-L4 06/28/2016 74.9 Normal -0.2 1.166 g/cm2 DualFemur Neck Left 06/28/2016 74.9 Osteopenia -1.5 0.834 g/cm2 World Health Organization [facility]) criteria for post-menopausal, Caucasian Women: Normal:       T-score at or above -1 SD Osteopenia:   T-score between -1 and -2.5 SD Osteoporosis: T-score at or below -2.5 SD RECOMMENDATIONS: [place] recommends that FDA-approved medical therapies be considered in postmenopausal women and men age 75 or older with a: 1. Hip or vertebral (clinical or morphometric) fracture. 2. T-score of < -2.5 at the spine or hip. 3. Ten-year fracture probability by FRAX of 3% or greater for hip fracture or 20% or greater for major osteoporotic fracture. All treatment decisions require clinical judgment and consideration of individual patient factors, including patient preferences, co-morbidities, previous drug use, risk factors not captured in the FRAX model (e.g. falls, vitamin D deficiency, increased bone turnover, interval significant decline in bone density) and possible under - or over-estimation of fracture risk by FRAX. All patients should ensure an adequate intake of dietary calcium (1200 mg/d) and vitamin D (800 IU daily) unless  contraindicated. FOLLOW-UP: People with diagnosed cases of osteoporosis or at high risk for fracture should have regular bone mineral density tests. For patients eligible for Medicare, routine testing is allowed once every 2 years. The testing frequency can be increased to one year for patients who have rapidly progressing disease, those who are receiving or discontinuing medical therapy to restore bone mass, or have additional risk factors. I have reviewed this report, and agree with the above findings. [facility] Electronically Signed   By: Lowella Grip III M.D.   On: 06/28/2016 11:32   Mm Screening Breast Tomo Bilateral  Result Date: 06/28/2016 CLINICAL DATA:  Screening. EXAM: 2D DIGITAL SCREENING BILATERAL MAMMOGRAM WITH CAD AND ADJUNCT TOMO COMPARISON:  Previous exam(s). ACR Breast Density Category b: There are scattered areas of fibroglandular density. FINDINGS: There are no findings suspicious for malignancy. Images were processed with CAD. IMPRESSION: No mammographic evidence of malignancy. A result letter of this screening mammogram will be mailed directly to the patient. RECOMMENDATION: Screening mammogram in one year. (Code:SM-B-01Y) BI-RADS CATEGORY  1: Negative. Electronically Signed   By: Curlene Dolphin M.D.   On: 06/28/2016 16:49       Assessment & Plan:   Problem List Items Addressed This Visit    BMI 31.0-31.9,adult    Diet and exercise.  Follow.        Diabetes ([place]) - Primary    Discussed with her today.  Discussed diagnosis.  Discussed continuing low carb diet and exercise.  Follow.        Relevant Orders   Hemoglobin A1c (Completed)   Microalbumin / creatinine urine ratio (Completed)   Hypercholesterolemia    Low cholesterol diet and exercise.  Follow lipid panel.        Relevant Orders   Lipid panel (Completed)   Hepatic function panel (Completed)   Hypertension    Blood pressure on recheck slightly increased.  Have her spot check her pressure.  She  reports blood pressure has been ok on checks obtained.  Has not been checking regularly.  Have her spot check her pressure.  Get her back in soon to reassess.  Follow.        Relevant Orders   CBC with Differential/Platelet (Completed)   TSH (Completed)   Basic metabolic panel (Completed)   Stress    On lexapro and handling  things relatively well.  Follow.         Other Visit Diagnoses    Environmental allergies       Mowed yesterday.  Steroid nasal spray.  saline nasal spray.  follow.         Einar Pheasant, MD

## 2016-11-01 ENCOUNTER — Other Ambulatory Visit: Payer: Self-pay | Admitting: Internal Medicine

## 2016-11-03 ENCOUNTER — Encounter: Payer: Self-pay | Admitting: Internal Medicine

## 2016-11-03 NOTE — Assessment & Plan Note (Signed)
Blood pressure on recheck slightly increased.  Have her spot check her pressure.  She reports blood pressure has been ok on checks obtained.  Has not been checking regularly.  Have her spot check her pressure.  Get her back in soon to reassess.  Follow.

## 2016-11-03 NOTE — Assessment & Plan Note (Signed)
Discussed with her today.  Discussed diagnosis.  Discussed continuing low carb diet and exercise.  Follow.

## 2016-11-03 NOTE — Assessment & Plan Note (Signed)
Low cholesterol diet and exercise.  Follow lipid panel.   

## 2016-11-03 NOTE — Assessment & Plan Note (Signed)
On lexapro and handling things relatively well.  Follow.

## 2016-11-03 NOTE — Assessment & Plan Note (Signed)
Diet and exercise.  Follow.  

## 2016-12-05 ENCOUNTER — Ambulatory Visit (INDEPENDENT_AMBULATORY_CARE_PROVIDER_SITE_OTHER): Payer: Medicare Other | Admitting: *Deleted

## 2016-12-05 ENCOUNTER — Telehealth: Payer: Self-pay

## 2016-12-05 DIAGNOSIS — Z23 Encounter for immunization: Secondary | ICD-10-CM | POA: Diagnosis not present

## 2016-12-05 NOTE — Telephone Encounter (Signed)
bp readings sorry put in blue folder for your review.

## 2016-12-05 NOTE — Telephone Encounter (Signed)
What did he bring by?

## 2016-12-05 NOTE — Telephone Encounter (Signed)
Pt husband brought by during his app today for your records.

## 2016-12-06 NOTE — Telephone Encounter (Signed)
Patient informed. 

## 2016-12-06 NOTE — Telephone Encounter (Signed)
Reviewed and placed in box.

## 2016-12-06 NOTE — Telephone Encounter (Signed)
Per Dr. Bary Leriche note blood pressures look good. Continue to monitor. Send to scan.

## 2017-01-11 ENCOUNTER — Other Ambulatory Visit: Payer: Self-pay | Admitting: Internal Medicine

## 2017-01-18 ENCOUNTER — Other Ambulatory Visit: Payer: Self-pay | Admitting: Internal Medicine

## 2017-02-28 ENCOUNTER — Ambulatory Visit: Payer: Medicare Other | Admitting: Internal Medicine

## 2017-02-28 ENCOUNTER — Encounter: Payer: Self-pay | Admitting: Internal Medicine

## 2017-02-28 VITALS — BP 148/88 | HR 67 | Temp 98.6°F | Resp 18 | Wt 186.8 lb

## 2017-02-28 DIAGNOSIS — E7800 Pure hypercholesterolemia, unspecified: Secondary | ICD-10-CM | POA: Diagnosis not present

## 2017-02-28 DIAGNOSIS — I10 Essential (primary) hypertension: Secondary | ICD-10-CM | POA: Diagnosis not present

## 2017-02-28 DIAGNOSIS — M5442 Lumbago with sciatica, left side: Secondary | ICD-10-CM | POA: Diagnosis not present

## 2017-02-28 DIAGNOSIS — R1030 Lower abdominal pain, unspecified: Secondary | ICD-10-CM | POA: Diagnosis not present

## 2017-02-28 DIAGNOSIS — E119 Type 2 diabetes mellitus without complications: Secondary | ICD-10-CM

## 2017-02-28 DIAGNOSIS — Z6831 Body mass index (BMI) 31.0-31.9, adult: Secondary | ICD-10-CM

## 2017-02-28 DIAGNOSIS — F439 Reaction to severe stress, unspecified: Secondary | ICD-10-CM | POA: Diagnosis not present

## 2017-02-28 MED ORDER — HYDROCHLOROTHIAZIDE 25 MG PO TABS: 25.0000 mg | ORAL_TABLET | Freq: Every day | ORAL | 1 refills | Status: DC

## 2017-02-28 NOTE — Progress Notes (Signed)
Patient ID: Lori Guzman, female   DOB: 07/31/1940, 77 y.o.   MRN: [id number]   Subjective:    Patient ID: Lori Guzman, female    DOB: [birth year], 77 y.o.   MRN: [id number]  HPI  Patient here for a scheduled follow up.  States she is doing relatively well.  Has been checking her blood pressures.  Reviewed outside readings.  Most averaging 120-130/50-60s.  Ran out of her HCTZ recently.  Some increased stress.  Overall she feels she is handling things relatively well.  Stays active.  No chest pain.  No sob.  No acid reflux.  No abdominal pain.  Bowels moving.  Does report some increased lower abdominal pressure.  Has been present since Thanksgiving.  Feels like pressure on her bladder.  Not as bad with standing.  No urine change.  Has had urine checked at urgent care. No infection reported.  Also reported problems with sciatica - left leg/low back.  Discussed stretches.     Past Medical History:  Diagnosis Date  . Allergy    reoccuring problems  . GERD (gastroesophageal reflux disease)   . History of colon polyps    adenomatous  . Hypertension    Past Surgical History:  Procedure Laterality Date  . BARTHOLIN GLAND CYST EXCISION    . CHOLECYSTECTOMY    . [place]  . TONSILECTOMY/ADENOIDECTOMY WITH MYRINGOTOMY  1950   Family History  Problem Relation Age of Onset  . Heart disease Mother   . CVA Mother   . Hypertension Mother   . Alzheimer's disease Mother   . Esophageal cancer Father   . Breast cancer Neg Hx   . Colon cancer Neg Hx    Social History   Socioeconomic History  . Marital status: Married    Spouse name: None  . Number of children: None  . Years of education: None  . Highest education level: None  Social Needs  . Financial resource strain: None  . Food insecurity - worry: None  . Food insecurity - inability: None  . Transportation needs - medical: None  . Transportation needs - non-medical: None  Occupational History  . None    Tobacco Use  . Smoking status: Never Smoker  . Smokeless tobacco: Never Used  Substance and Sexual Activity  . Alcohol use: No    Alcohol/week: 0.0 oz  . Drug use: No  . Sexual activity: None  Other Topics Concern  . None  Social History Narrative   She is married, has 3 children and is retired.    Outpatient Encounter Medications as of 02/28/2017  Medication Sig  . aspirin EC 81 MG tablet Take 81 mg by mouth daily.  Marland Kitchen escitalopram (LEXAPRO) 10 MG tablet TAKE 1 TABLET BY MOUTH EVERY DAY  . hydrochlorothiazide (HYDRODIURIL) 25 MG tablet Take 1 tablet (25 mg total) by mouth daily.  . mometasone (NASONEX) 50 MCG/ACT nasal spray Place into the nose.  . telmisartan (MICARDIS) 80 MG tablet TAKE 1 TABLET BY MOUTH EVERY DAY  . [DISCONTINUED] hydrochlorothiazide (HYDRODIURIL) 25 MG tablet TAKE 1 TABLET (25 MG TOTAL) BY MOUTH DAILY.   No facility-administered encounter medications on file as of 02/28/2017.     Review of Systems  Constitutional: Negative for appetite change and unexpected weight change.  HENT: Negative for congestion and sinus pressure.   Respiratory: Negative for cough, chest tightness and shortness of breath.   Cardiovascular: Negative for chest pain, palpitations and leg swelling.  Gastrointestinal: Positive  for abdominal pain. Negative for diarrhea, nausea and vomiting.  Genitourinary: Negative for difficulty urinating and dysuria.  Musculoskeletal: Negative for joint swelling.       Left leg pain as outlined.    Skin: Negative for color change and rash.  Neurological: Negative for dizziness, light-headedness and headaches.  Psychiatric/Behavioral: Negative for agitation and dysphoric mood.       Objective:     Blood pressure rechecked by me:  152/78  Physical Exam  Constitutional: She appears well-developed and well-nourished. No distress.  HENT:  Nose: Nose normal.  Mouth/Throat: Oropharynx is clear and moist.  Neck: Neck supple. No thyromegaly present.   Cardiovascular: Normal rate and regular rhythm.  Pulmonary/Chest: Breath sounds normal. No respiratory distress. She has no wheezes.  Abdominal: Soft. Bowel sounds are normal.  No significant tenderness to palpation.    Musculoskeletal: She exhibits no edema or tenderness.  Lymphadenopathy:    She has no cervical adenopathy.  Skin: No rash noted. No erythema.  Psychiatric: She has a normal mood and affect. Her behavior is normal.    BP (!) 148/88 (BP Location: Left Arm, Patient Position: Sitting, Cuff Size: Large)   Pulse 67   Temp 98.6 F (37 C) (Oral)   Resp 18   Wt 186 lb 12.8 oz (84.7 kg)   LMP 02/29/1992   SpO2 96%   BMI 31.09 kg/m  Wt Readings from Last 3 Encounters:  02/28/17 186 lb 12.8 oz (84.7 kg)  10/31/16 188 lb 12.8 oz (85.6 kg)  05/25/16 191 lb (86.6 kg)     Lab Results  Component Value Date   WBC 6.1 10/31/2016   HGB 12.7 10/31/2016   HCT 37.8 10/31/2016   PLT 254.0 10/31/2016   GLUCOSE 105 (H) 10/31/2016   CHOL 180 10/31/2016   TRIG 79.0 10/31/2016   HDL 65.20 10/31/2016   LDLCALC 99 10/31/2016   ALT 11 10/31/2016   AST 13 10/31/2016   NA 140 10/31/2016   K 4.3 10/31/2016   CL 102 10/31/2016   CREATININE 0.87 10/31/2016   BUN 15 10/31/2016   CO2 30 10/31/2016   TSH 2.19 10/31/2016   HGBA1C 6.3 10/31/2016   MICROALBUR 0.7 10/31/2016    Dg Bone Density  Result Date: 06/28/2016 EXAM: DUAL X-RAY ABSORPTIOMETRY (DXA) FOR BONE MINERAL DENSITY IMPRESSION: Dear Dr. Einar Pheasant, Your patient Lori Guzman completed a FRAX assessment on 06/28/2016 using the [place] (analysis version: 14.10) manufactured by [employer]. The following summarizes the results of our evaluation. PATIENT BIOGRAPHICAL: Name: Lori, Guzman Patient ID: [id number] Birth Date: 1940-09-18 Height:    64.0 in. Gender:     Female    [age]       Weight:    190.2 lbs. Ethnicity:  White                            Exam Date: 06/28/2016 FRAX* RESULTS:   (version: 3.5) 10-year Probability of Fracture1 Major Osteoporotic Fracture2 Hip Fracture 10.5% 2.0% Population: [place] (Caucasian) Risk Factors: None Based on Femur (Left) Neck BMD 1 -The 10-year probability of fracture may be lower than reported if the patient has received treatment. 2 -Major Osteoporotic Fracture: Clinical Spine, Forearm, Hip or Shoulder *FRAX is a Materials engineer of the [employer] for Metabolic Bone Disease, a [place] (WHO) [facility]. ASSESSMENT: The probability of a major osteoporotic  fracture is 10.5% within the next ten years. The probability of a hip fracture is 2.0% within the next ten years. . Dear Dr. Einar Pheasant, Your patient Theodore Rahrig completed a BMD test on 06/28/2016 using the [place] (analysis version: 14.10) manufactured by [employer]. The following summarizes the results of our evaluation. PATIENT BIOGRAPHICAL: Name: Aylah, Yeary Patient ID: [id number] Birth Date: 03/18/1940 Height: 64.0 in. Gender: Female Exam Date: 06/28/2016 Weight: 190.2 lbs. Indications: Advanced Age, Caucasian, Height Loss, Postmenopausal Fractures: Treatments: ASPRIN 81 MG, Calcium, lexapro ASSESSMENT: The BMD measured at Femur Neck Left is 0.834 g/cm2 with a T-score of -1.5. This patient is considered osteopenic according to [facility]) criteria. Site Region Measured Measured WHO Young Adult BMD Date       Age      Classification T-score AP Spine L1-L4 06/28/2016 74.9 Normal -0.2 1.166 g/cm2 DualFemur Neck Left 06/28/2016 74.9 Osteopenia -1.5 0.834 g/cm2 World Health Organization [facility]) criteria for post-menopausal, Caucasian Women: Normal:       T-score at or above -1 SD Osteopenia:   T-score between -1 and -2.5 SD Osteoporosis: T-score at or below -2.5 SD RECOMMENDATIONS: [place] recommends that FDA-approved medical therapies be considered in postmenopausal women and men  age 67 or older with a: 1. Hip or vertebral (clinical or morphometric) fracture. 2. T-score of < -2.5 at the spine or hip. 3. Ten-year fracture probability by FRAX of 3% or greater for hip fracture or 20% or greater for major osteoporotic fracture. All treatment decisions require clinical judgment and consideration of individual patient factors, including patient preferences, co-morbidities, previous drug use, risk factors not captured in the FRAX model (e.g. falls, vitamin D deficiency, increased bone turnover, interval significant decline in bone density) and possible under - or over-estimation of fracture risk by FRAX. All patients should ensure an adequate intake of dietary calcium (1200 mg/d) and vitamin D (800 IU daily) unless contraindicated. FOLLOW-UP: People with diagnosed cases of osteoporosis or at high risk for fracture should have regular bone mineral density tests. For patients eligible for Medicare, routine testing is allowed once every 2 years. The testing frequency can be increased to one year for patients who have rapidly progressing disease, those who are receiving or discontinuing medical therapy to restore bone mass, or have additional risk factors. I have reviewed this report, and agree with the above findings. [facility] Radiology Electronically Signed   By: Lowella Grip III M.D.   On: 06/28/2016 11:32   Mm Screening Breast Tomo Bilateral  Result Date: 06/28/2016 CLINICAL DATA:  Screening. EXAM: 2D DIGITAL SCREENING BILATERAL MAMMOGRAM WITH CAD AND ADJUNCT TOMO COMPARISON:  Previous exam(s). ACR Breast Density Category b: There are scattered areas of fibroglandular density. FINDINGS: There are no findings suspicious for malignancy. Images were processed with CAD. IMPRESSION: No mammographic evidence of malignancy. A result letter of this screening mammogram will be mailed directly to the patient. RECOMMENDATION: Screening mammogram in one year. (Code:SM-B-01Y) BI-RADS CATEGORY  1:  Negative. Electronically Signed   By: Curlene Dolphin M.D.   On: 06/28/2016 16:49       Assessment & Plan:   Problem List Items Addressed This Visit    Back pain    Low back pain with left leg pain.  Discussed stretches.  She wants to hold on any further w/up or evaluation.  Feels is stable.       BMI 31.0-31.9,adult    Discussed diet and exercise.  Follow.  Diabetes ([place])    Low carb diet and exercise.  Follow met b and a1c.       Relevant Orders   Hemoglobin N4O   Basic metabolic panel   Hypercholesterolemia    Low cholesterol diet and exercise.  Follow lipid panel.       Relevant Medications   hydrochlorothiazide (HYDRODIURIL) 25 MG tablet   Other Relevant Orders   Hepatic function panel   Lipid panel   Hypertension    Blood pressure elevated today.  Checks outside- under control.  Make sure taking medication daily and not running out.  Off hctz per her note.  Restart.  Follow pressures.  Follow metabolic panel.        Relevant Medications   hydrochlorothiazide (HYDRODIURIL) 25 MG tablet   Lower abdominal pain - Primary    Persistent symptoms as outlined.  Recheck urine.  Check pelvic ultrasound.       Relevant Orders   Urinalysis, Routine w reflex microscopic (Completed)   Urine Culture (Completed)   US PELVIC COMPLETE WITH TRANSVAGINAL   Stress    On lexapro.  Overall she feels she is handling things well.  Follow.           Einar Pheasant, MD

## 2017-03-01 LAB — URINALYSIS, ROUTINE W REFLEX MICROSCOPIC
Bilirubin Urine: NEGATIVE
KETONES UR: NEGATIVE
Leukocytes, UA: NEGATIVE
Nitrite: NEGATIVE
RBC / HPF: NONE SEEN (ref 0–?)
TOTAL PROTEIN, URINE-UPE24: NEGATIVE
URINE GLUCOSE: NEGATIVE
Urobilinogen, UA: 0.2 (ref 0.0–1.0)
pH: 6.5 (ref 5.0–8.0)

## 2017-03-01 LAB — URINE CULTURE
MICRO NUMBER:: 90072484
SPECIMEN QUALITY: ADEQUATE

## 2017-03-03 ENCOUNTER — Encounter: Payer: Self-pay | Admitting: Internal Medicine

## 2017-03-03 DIAGNOSIS — R1030 Lower abdominal pain, unspecified: Secondary | ICD-10-CM | POA: Insufficient documentation

## 2017-03-03 NOTE — Assessment & Plan Note (Signed)
Low carb diet and exercise.  Follow met b and a1c.  

## 2017-03-03 NOTE — Assessment & Plan Note (Signed)
Persistent symptoms as outlined.  Recheck urine.  Check pelvic ultrasound.

## 2017-03-03 NOTE — Assessment & Plan Note (Signed)
Low cholesterol diet and exercise.  Follow lipid panel.   

## 2017-03-03 NOTE — Assessment & Plan Note (Signed)
On lexapro.  Overall she feels she is handling things well.  Follow.

## 2017-03-03 NOTE — Assessment & Plan Note (Signed)
Blood pressure elevated today.  Checks outside- under control.  Make sure taking medication daily and not running out.  Off hctz per her note.  Restart.  Follow pressures.  Follow metabolic panel.

## 2017-03-03 NOTE — Assessment & Plan Note (Signed)
Discussed diet and exercise.  Follow.  

## 2017-03-03 NOTE — Assessment & Plan Note (Signed)
Low back pain with left leg pain.  Discussed stretches.  She wants to hold on any further w/up or evaluation.  Feels is stable.

## 2017-03-08 ENCOUNTER — Ambulatory Visit
Admission: RE | Admit: 2017-03-08 | Discharge: 2017-03-08 | Disposition: A | Discharge status: Home / Self Care | Payer: Medicare Other | Source: Ambulatory Visit | Attending: Internal Medicine | Admitting: Internal Medicine

## 2017-03-08 DIAGNOSIS — D259 Leiomyoma of uterus, unspecified: Secondary | ICD-10-CM | POA: Diagnosis not present

## 2017-03-08 DIAGNOSIS — R1030 Lower abdominal pain, unspecified: Secondary | ICD-10-CM | POA: Diagnosis present

## 2017-05-22 ENCOUNTER — Other Ambulatory Visit (INDEPENDENT_AMBULATORY_CARE_PROVIDER_SITE_OTHER): Payer: Medicare Other

## 2017-05-22 DIAGNOSIS — E7800 Pure hypercholesterolemia, unspecified: Secondary | ICD-10-CM

## 2017-05-22 DIAGNOSIS — E119 Type 2 diabetes mellitus without complications: Secondary | ICD-10-CM

## 2017-05-22 LAB — BASIC METABOLIC PANEL
BUN: 17 mg/dL (ref 6–23)
CHLORIDE: 105 meq/L (ref 96–112)
CO2: 31 mEq/L (ref 19–32)
Calcium: 9.3 mg/dL (ref 8.4–10.5)
Creatinine, Ser: 0.82 mg/dL (ref 0.40–1.20)
GFR: 72.06 mL/min (ref >60.00)
GLUCOSE: 118 mg/dL — AB (ref 70–99)
POTASSIUM: 4.3 meq/L (ref 3.5–5.1)
Sodium: 142 mEq/L (ref 135–145)

## 2017-05-22 LAB — LIPID PANEL
CHOL/HDL RATIO: 3
Cholesterol: 173 mg/dL (ref 0–200)
HDL: 54.9 mg/dL (ref >39.00)
LDL CALC: 104 mg/dL — AB (ref 0–99)
NONHDL: 118.56
Triglycerides: 75 mg/dL (ref 0.0–149.0)
VLDL: 15 mg/dL (ref 0.0–40.0)

## 2017-05-22 LAB — HEPATIC FUNCTION PANEL
ALT: 11 U/L (ref 0–35)
AST: 13 U/L (ref 0–37)
Albumin: 4.2 g/dL (ref 3.5–5.2)
Alkaline Phosphatase: 40 U/L (ref 39–117)
BILIRUBIN TOTAL: 1 mg/dL (ref 0.2–1.2)
Bilirubin, Direct: 0.2 mg/dL (ref 0.0–0.3)
Total Protein: 6.7 g/dL (ref 6.0–8.3)

## 2017-05-22 LAB — HEMOGLOBIN A1C: Hgb A1c MFr Bld: 6.3 % (ref 4.6–6.5)

## 2017-05-28 ENCOUNTER — Ambulatory Visit (INDEPENDENT_AMBULATORY_CARE_PROVIDER_SITE_OTHER): Payer: Medicare Other | Admitting: Internal Medicine

## 2017-05-28 ENCOUNTER — Encounter: Payer: Medicare Other | Admitting: Internal Medicine

## 2017-05-28 VITALS — BP 138/72 | HR 60 | Temp 98.0°F | Resp 18 | Wt 187.2 lb

## 2017-05-28 DIAGNOSIS — Z6831 Body mass index (BMI) 31.0-31.9, adult: Secondary | ICD-10-CM

## 2017-05-28 DIAGNOSIS — E119 Type 2 diabetes mellitus without complications: Secondary | ICD-10-CM

## 2017-05-28 DIAGNOSIS — Z1239 Encounter for other screening for malignant neoplasm of breast: Secondary | ICD-10-CM

## 2017-05-28 DIAGNOSIS — Z1231 Encounter for screening mammogram for malignant neoplasm of breast: Secondary | ICD-10-CM | POA: Diagnosis not present

## 2017-05-28 DIAGNOSIS — Z0000 Encounter for general adult medical examination without abnormal findings: Secondary | ICD-10-CM | POA: Diagnosis not present

## 2017-05-28 DIAGNOSIS — E7800 Pure hypercholesterolemia, unspecified: Secondary | ICD-10-CM | POA: Diagnosis not present

## 2017-05-28 DIAGNOSIS — I10 Essential (primary) hypertension: Secondary | ICD-10-CM

## 2017-05-28 MED ORDER — ROSUVASTATIN CALCIUM 5 MG PO TABS: 5.0000 mg | ORAL_TABLET | Freq: Every day | ORAL | 1 refills | Status: DC

## 2017-05-28 NOTE — Progress Notes (Signed)
Patient ID: Lori Guzman, female   DOB: 1940-06-05, 77 y.o.   MRN: [id number]   Subjective:    Patient ID: Lori Guzman, female    DOB: 1940/07/28, 77 y.o.   MRN: [id number]  HPI  Patient here for her physical exam.  She reports she is doing well.  Tries to stay active.  No chest pain.  No sob.  No acid reflux.  No abdominal pain.  The previous pain has resolved.  Some mild constipation at times.  No significant bowel issues.  Blood pressures have been doing well.  Outside blood pressures reviewed.  Blood pressures averaging 118-120s/50-60s.  Discussed cholesterol labs.  Discussed calculated cholesterol risk and my desire to start her on cholesterol medication.     Past Medical History:  Diagnosis Date  . Allergy    reoccuring problems  . GERD (gastroesophageal reflux disease)   . History of colon polyps    adenomatous  . Hypertension    Past Surgical History:  Procedure Laterality Date  . BARTHOLIN GLAND CYST EXCISION    . CHOLECYSTECTOMY    . [place]  . TONSILECTOMY/ADENOIDECTOMY WITH MYRINGOTOMY  1950   Family History  Problem Relation Age of Onset  . Heart disease Mother   . CVA Mother   . Hypertension Mother   . Alzheimer's disease Mother   . Esophageal cancer Father   . Breast cancer Neg Hx   . Colon cancer Neg Hx    Social History   Socioeconomic History  . Marital status: Married    Spouse name: Not on file  . Number of children: Not on file  . Years of education: Not on file  . Highest education level: Not on file  Occupational History  . Not on file  Social Needs  . Financial resource strain: Not on file  . Food insecurity:    Worry: Not on file    Inability: Not on file  . Transportation needs:    Medical: Not on file    Non-medical: Not on file  Tobacco Use  . Smoking status: Never Smoker  . Smokeless tobacco: Never Used  Substance and Sexual Activity  . Alcohol use: No    Alcohol/week: 0.0 oz  . Drug use: No  .  Sexual activity: Not on file  Lifestyle  . Physical activity:    Days per week: Not on file    Minutes per session: Not on file  . Stress: Not on file  Relationships  . Social connections:    Talks on phone: Not on file    Gets together: Not on file    Attends religious service: Not on file    Active member of club or organization: Not on file    Attends meetings of clubs or organizations: Not on file    Relationship status: Not on file  Other Topics Concern  . Not on file  Social History Narrative   She is married, has 3 children and is retired.    Outpatient Encounter Medications as of 05/28/2017  Medication Sig  . aspirin EC 81 MG tablet Take 81 mg by mouth daily.  Marland Kitchen escitalopram (LEXAPRO) 10 MG tablet TAKE 1 TABLET BY MOUTH EVERY DAY  . hydrochlorothiazide (HYDRODIURIL) 25 MG tablet Take 1 tablet (25 mg total) by mouth daily.  . mometasone (NASONEX) 50 MCG/ACT nasal spray Place into the nose.  . telmisartan (MICARDIS) 80 MG tablet TAKE 1 TABLET BY MOUTH EVERY DAY  . rosuvastatin (  CRESTOR) 5 MG tablet Take 1 tablet (5 mg total) by mouth daily.   No facility-administered encounter medications on file as of 05/28/2017.     Review of Systems  Constitutional: Negative for appetite change and unexpected weight change.  HENT: Negative for congestion and sinus pressure.   Eyes: Negative for pain and visual disturbance.  Respiratory: Negative for cough, chest tightness and shortness of breath.   Cardiovascular: Negative for chest pain, palpitations and leg swelling.  Gastrointestinal: Negative for abdominal pain, diarrhea, nausea and vomiting.  Genitourinary: Negative for difficulty urinating and dysuria.  Musculoskeletal: Negative for joint swelling and myalgias.  Skin: Negative for color change and rash.  Neurological: Negative for dizziness, light-headedness and headaches.  Hematological: Negative for adenopathy. Does not bruise/bleed easily.  Psychiatric/Behavioral: Negative  for agitation and dysphoric mood.       Objective:     Blood pressure rechecked by me:  138/66  Physical Exam  Constitutional: She is oriented to person, place, and time. She appears well-developed and well-nourished. No distress.  HENT:  Nose: Nose normal.  Mouth/Throat: Oropharynx is clear and moist.  Eyes: Right eye exhibits no discharge. Left eye exhibits no discharge. No scleral icterus.  Neck: Neck supple. No thyromegaly present.  Cardiovascular: Normal rate and regular rhythm.  Pulmonary/Chest: Breath sounds normal. No accessory muscle usage. No tachypnea. No respiratory distress. She has no decreased breath sounds. She has no wheezes. She has no rhonchi. Right breast exhibits no inverted nipple, no mass, no nipple discharge and no tenderness (no axillary adenopathy). Left breast exhibits no inverted nipple, no mass, no nipple discharge and no tenderness (no axilarry adenopathy).  Abdominal: Soft. Bowel sounds are normal. There is no tenderness.  Musculoskeletal: She exhibits no edema or tenderness.  Lymphadenopathy:    She has no cervical adenopathy.  Neurological: She is alert and oriented to person, place, and time.  Skin: Skin is warm. No rash noted.  Psychiatric: She has a normal mood and affect. Her behavior is normal.    BP 138/72 (BP Location: Left Arm, Patient Position: Sitting, Cuff Size: Normal)   Pulse 60   Temp 98 F (36.7 C) (Oral)   Resp 18   Wt 187 lb 3.2 oz (84.9 kg)   LMP 02/29/1992   SpO2 95%   BMI 31.15 kg/m  Wt Readings from Last 3 Encounters:  05/28/17 187 lb 3.2 oz (84.9 kg)  02/28/17 186 lb 12.8 oz (84.7 kg)  10/31/16 188 lb 12.8 oz (85.6 kg)     Lab Results  Component Value Date   WBC 6.1 10/31/2016   HGB 12.7 10/31/2016   HCT 37.8 10/31/2016   PLT 254.0 10/31/2016   GLUCOSE 118 (H) 05/22/2017   CHOL 173 05/22/2017   TRIG 75.0 05/22/2017   HDL 54.90 05/22/2017   LDLCALC 104 (H) 05/22/2017   ALT 11 05/22/2017   AST 13 05/22/2017    NA 142 05/22/2017   K 4.3 05/22/2017   CL 105 05/22/2017   CREATININE 0.82 05/22/2017   BUN 17 05/22/2017   CO2 31 05/22/2017   TSH 2.19 10/31/2016   HGBA1C 6.3 05/22/2017   MICROALBUR 0.7 10/31/2016    US Pelvic Complete With Transvaginal  Result Date: 03/08/2017 CLINICAL DATA:  Lower abdominal pain. EXAM: TRANSABDOMINAL AND TRANSVAGINAL ULTRASOUND OF PELVIS TECHNIQUE: Both transabdominal and transvaginal ultrasound examinations of the pelvis were performed. Transabdominal technique was performed for global imaging of the pelvis including uterus, ovaries, adnexal regions, and pelvic cul-de-sac. It was necessary to  proceed with endovaginal exam following the transabdominal exam to visualize the endometrium and adnexal structures. COMPARISON:  None FINDINGS: Uterus Measurements: 5.9 x 3.5 x 4.8 cm. There is a 3.3 x 1.8 x 2.6 cm intramural fibroid within the posterior uterine body. Endometrium Thickness: 3 mm.  No focal abnormality visualized. Right ovary Not visualized. Left ovary Not visualized. Other findings No abnormal free fluid. IMPRESSION: Fibroid uterus. No acute process. Electronically Signed   By: Lovey Newcomer M.D.   On: 03/08/2017 16:57       Assessment & Plan:   Problem List Items Addressed This Visit    BMI 31.0-31.9,adult    Discussed diet and exercise.  Follow.       Diabetes ([place])    Low carb diet and exercise.  Follow met b and a1c.       Relevant Medications   rosuvastatin (CRESTOR) 5 MG tablet   Health care maintenance    Physical today 05/28/17.  Mammogram 06/28/16 - birads I.  colonscopy 04/06/14 - normal.  Recommend f/u in 5 years.  Schedule f/u mammogram.       Hypercholesterolemia    Discussed cholesterol labs and calculated cholesterol risk.  Discussed starting crestor.  Will start crestor 89m q day.  Check liver panel in 6 weeks.        Relevant Medications   rosuvastatin (CRESTOR) 5 MG tablet   Other Relevant Orders   Hepatic function panel    Hypertension    Blood pressure under good control.  Continue same medication regimen.  Follow pressures.  Follow metabolic panel.        Relevant Medications   rosuvastatin (CRESTOR) 5 MG tablet    Other Visit Diagnoses    Routine general medical examination at a health care facility    -  Primary   Breast cancer screening       Relevant Orders   MM Digital Screening       SEinar Pheasant MD

## 2017-05-28 NOTE — Assessment & Plan Note (Addendum)
Physical today 05/28/17.  Mammogram 06/28/16 - birads I.  colonscopy 04/06/14 - normal.  Recommend f/u in 5 years.  Schedule f/u mammogram.

## 2017-05-31 ENCOUNTER — Encounter: Payer: Self-pay | Admitting: Internal Medicine

## 2017-05-31 NOTE — Assessment & Plan Note (Signed)
Discussed cholesterol labs and calculated cholesterol risk.  Discussed starting crestor.  Will start crestor 10mg  q day.  Check liver panel in 6 weeks.

## 2017-05-31 NOTE — Assessment & Plan Note (Signed)
Discussed diet and exercise.  Follow.  

## 2017-05-31 NOTE — Assessment & Plan Note (Signed)
Blood pressure under good control.  Continue same medication regimen.  Follow pressures.  Follow metabolic panel.   

## 2017-05-31 NOTE — Assessment & Plan Note (Signed)
Low carb diet and exercise.  Follow met b and a1c.  

## 2017-06-20 ENCOUNTER — Other Ambulatory Visit: Payer: Self-pay | Admitting: Internal Medicine

## 2017-07-09 ENCOUNTER — Encounter: Payer: Self-pay | Admitting: Internal Medicine

## 2017-07-09 ENCOUNTER — Other Ambulatory Visit: Payer: Self-pay | Admitting: Internal Medicine

## 2017-07-09 ENCOUNTER — Other Ambulatory Visit (INDEPENDENT_AMBULATORY_CARE_PROVIDER_SITE_OTHER): Payer: Medicare Other

## 2017-07-09 DIAGNOSIS — E7800 Pure hypercholesterolemia, unspecified: Secondary | ICD-10-CM | POA: Diagnosis not present

## 2017-07-09 LAB — HEPATIC FUNCTION PANEL
ALBUMIN: 4.3 g/dL (ref 3.5–5.2)
ALT: 11 U/L (ref 0–35)
AST: 15 U/L (ref 0–37)
Alkaline Phosphatase: 44 U/L (ref 39–117)
Bilirubin, Direct: 0.2 mg/dL (ref 0.0–0.3)
Total Bilirubin: 1.2 mg/dL (ref 0.2–1.2)
Total Protein: 7.2 g/dL (ref 6.0–8.3)

## 2017-07-14 ENCOUNTER — Other Ambulatory Visit: Payer: Self-pay | Admitting: Internal Medicine

## 2017-07-19 ENCOUNTER — Other Ambulatory Visit: Payer: Self-pay | Admitting: Internal Medicine

## 2017-08-07 ENCOUNTER — Encounter: Payer: Self-pay | Admitting: Internal Medicine

## 2017-08-21 ENCOUNTER — Other Ambulatory Visit: Payer: Self-pay | Admitting: Internal Medicine

## 2017-09-06 ENCOUNTER — Other Ambulatory Visit: Payer: Self-pay | Admitting: Internal Medicine

## 2017-09-06 ENCOUNTER — Ambulatory Visit
Admission: RE | Admit: 2017-09-06 | Discharge: 2017-09-06 | Disposition: A | Discharge status: Home / Self Care | Payer: Medicare Other | Source: Ambulatory Visit | Attending: Internal Medicine | Admitting: Internal Medicine

## 2017-09-06 DIAGNOSIS — Z1231 Encounter for screening mammogram for malignant neoplasm of breast: Secondary | ICD-10-CM | POA: Insufficient documentation

## 2017-09-06 DIAGNOSIS — Z1239 Encounter for other screening for malignant neoplasm of breast: Secondary | ICD-10-CM

## 2017-09-27 ENCOUNTER — Ambulatory Visit: Payer: Medicare Other | Admitting: Internal Medicine

## 2017-09-27 ENCOUNTER — Encounter: Payer: Self-pay | Admitting: Internal Medicine

## 2017-09-27 DIAGNOSIS — F439 Reaction to severe stress, unspecified: Secondary | ICD-10-CM

## 2017-09-27 DIAGNOSIS — E7800 Pure hypercholesterolemia, unspecified: Secondary | ICD-10-CM | POA: Diagnosis not present

## 2017-09-27 DIAGNOSIS — I10 Essential (primary) hypertension: Secondary | ICD-10-CM | POA: Diagnosis not present

## 2017-09-27 DIAGNOSIS — E119 Type 2 diabetes mellitus without complications: Secondary | ICD-10-CM | POA: Diagnosis not present

## 2017-09-27 NOTE — Progress Notes (Signed)
Patient ID: Lori Guzman, female   DOB: 1940/05/07, 77 y.o.   MRN: [id number]   Subjective:    Patient ID: Lori Guzman, female    DOB: Oct 20, 1940, 77 y.o.   MRN: [id number]  HPI  Patient here for a scheduled follow up.  She reports she is doing well.  Trying to stay active.  No chest pain.  No sob.  No acid reflux.  No abdominal pain.  Bowels moving.  Was on crestor.  Caused cramps.  Stopped crestor and cramps stopped.  Desires to work on diet and exercise.  Wants to take red yeast rice and fish oil instead of statin medication.  Handling stress.   States her blood pressure is averaging 116-135/60-70s.     Past Medical History:  Diagnosis Date  . Allergy    reoccuring problems  . GERD (gastroesophageal reflux disease)   . History of colon polyps    adenomatous  . Hypertension    Past Surgical History:  Procedure Laterality Date  . BARTHOLIN GLAND CYST EXCISION    . CHOLECYSTECTOMY    . [place]  . TONSILECTOMY/ADENOIDECTOMY WITH MYRINGOTOMY  1950   Family History  Problem Relation Age of Onset  . Heart disease Mother   . CVA Mother   . Hypertension Mother   . Alzheimer's disease Mother   . Esophageal cancer Father   . Breast cancer Neg Hx   . Colon cancer Neg Hx    Social History   Socioeconomic History  . Marital status: Married    Spouse name: Not on file  . Number of children: Not on file  . Years of education: Not on file  . Highest education level: Not on file  Occupational History  . Not on file  Social Needs  . Financial resource strain: Not on file  . Food insecurity:    Worry: Not on file    Inability: Not on file  . Transportation needs:    Medical: Not on file    Non-medical: Not on file  Tobacco Use  . Smoking status: Never Smoker  . Smokeless tobacco: Never Used  Substance and Sexual Activity  . Alcohol use: No    Alcohol/week: 0.0 standard drinks  . Drug use: No  . Sexual activity: Not on file  Lifestyle  .  Physical activity:    Days per week: Not on file    Minutes per session: Not on file  . Stress: Not on file  Relationships  . Social connections:    Talks on phone: Not on file    Gets together: Not on file    Attends religious service: Not on file    Active member of club or organization: Not on file    Attends meetings of clubs or organizations: Not on file    Relationship status: Not on file  Other Topics Concern  . Not on file  Social History Narrative   She is married, has 3 children and is retired.    Outpatient Encounter Medications as of 09/27/2017  Medication Sig  . aspirin EC 81 MG tablet Take 81 mg by mouth daily.  Marland Kitchen escitalopram (LEXAPRO) 10 MG tablet TAKE 1 TABLET BY MOUTH EVERY DAY  . hydrochlorothiazide (HYDRODIURIL) 25 MG tablet TAKE 1 TABLET BY MOUTH EVERY DAY  . mometasone (NASONEX) 50 MCG/ACT nasal spray Place into the nose.  . telmisartan (MICARDIS) 80 MG tablet TAKE 1 TABLET BY MOUTH EVERY DAY  . [DISCONTINUED] rosuvastatin (CRESTOR)  5 MG tablet TAKE 1 TABLET BY MOUTH EVERY DAY   No facility-administered encounter medications on file as of 09/27/2017.     Review of Systems  Constitutional: Negative for appetite change and unexpected weight change.  HENT: Negative for congestion and sinus pressure.   Respiratory: Negative for cough, chest tightness and shortness of breath.   Cardiovascular: Negative for chest pain, palpitations and leg swelling.  Gastrointestinal: Negative for abdominal pain, diarrhea, nausea and vomiting.  Genitourinary: Negative for difficulty urinating and dysuria.  Musculoskeletal: Negative for joint swelling and myalgias.  Skin: Negative for color change and rash.  Neurological: Negative for dizziness, light-headedness and headaches.  Psychiatric/Behavioral: Negative for agitation and dysphoric mood.       Objective:     Blood pressure rechecked by me:  134/72  Physical Exam  Constitutional: She appears well-developed and  well-nourished. No distress.  HENT:  Nose: Nose normal.  Mouth/Throat: Oropharynx is clear and moist.  Neck: Neck supple. No thyromegaly present.  Cardiovascular: Normal rate and regular rhythm.  Pulmonary/Chest: Breath sounds normal. No respiratory distress. She has no wheezes.  Abdominal: Soft. Bowel sounds are normal. There is no tenderness.  Musculoskeletal: She exhibits no edema or tenderness.  Lymphadenopathy:    She has no cervical adenopathy.  Skin: No rash noted. No erythema.  Psychiatric: She has a normal mood and affect. Her behavior is normal.    BP 136/84   Pulse 71   Temp 98.6 F (37 C) (Oral)   Ht 5' 5"  (1.651 m)   Wt 184 lb 6.4 oz (83.6 kg)   LMP 02/29/1992   SpO2 95%   BMI 30.69 kg/m  Wt Readings from Last 3 Encounters:  09/27/17 184 lb 6.4 oz (83.6 kg)  05/28/17 187 lb 3.2 oz (84.9 kg)  02/28/17 186 lb 12.8 oz (84.7 kg)     Lab Results  Component Value Date   WBC 6.1 10/31/2016   HGB 12.7 10/31/2016   HCT 37.8 10/31/2016   PLT 254.0 10/31/2016   GLUCOSE 118 (H) 05/22/2017   CHOL 173 05/22/2017   TRIG 75.0 05/22/2017   HDL 54.90 05/22/2017   LDLCALC 104 (H) 05/22/2017   ALT 11 07/09/2017   AST 15 07/09/2017   NA 142 05/22/2017   K 4.3 05/22/2017   CL 105 05/22/2017   CREATININE 0.82 05/22/2017   BUN 17 05/22/2017   CO2 31 05/22/2017   TSH 2.19 10/31/2016   HGBA1C 6.3 05/22/2017   MICROALBUR 0.7 10/31/2016    Mm 3d Screen Breast Bilateral  Result Date: 09/06/2017 CLINICAL DATA:  Screening. EXAM: DIGITAL SCREENING BILATERAL MAMMOGRAM WITH TOMO AND CAD COMPARISON:  Previous exam(s). ACR Breast Density Category b: There are scattered areas of fibroglandular density. FINDINGS: There are no findings suspicious for malignancy. Images were processed with CAD. IMPRESSION: No mammographic evidence of malignancy. A result letter of this screening mammogram will be mailed directly to the patient. RECOMMENDATION: Screening mammogram in one year.  (Code:SM-B-01Y) BI-RADS CATEGORY  1: Negative. Electronically Signed   By: Franki Cabot M.D.   On: 09/06/2017 11:56       Assessment & Plan:   Problem List Items Addressed This Visit    Diabetes ([place])    Low carb diet and exercise.  Follow met b and a1c.        Relevant Orders   Hemoglobin H9Q   Basic metabolic panel   Microalbumin / creatinine urine ratio   Hypercholesterolemia    crestor caused cramping. Off now.  Feels better.  Wants to take red yeast rice and fish oil instead of statin medication.  Low cholesterol diet and exercise.  Follow lipid panel.        Relevant Orders   Hepatic function panel   Lipid panel   Hypertension    Blood pressure has been under good control.  Same medication regimen.  Follow pressures.  Follow metabolic panel.        Relevant Orders   CBC with Differential/Platelet   TSH   Stress    On lexapro.  Overall she feels seh is doing well.  Follow.            Einar Pheasant, MD

## 2017-10-01 ENCOUNTER — Encounter: Payer: Self-pay | Admitting: Internal Medicine

## 2017-10-01 NOTE — Assessment & Plan Note (Signed)
On lexapro.  Overall she feels seh is doing well.  Follow.

## 2017-10-01 NOTE — Assessment & Plan Note (Signed)
Blood pressure has been under good control.  Same medication regimen.  Follow pressures.   Follow metabolic panel.   

## 2017-10-01 NOTE — Assessment & Plan Note (Signed)
crestor caused cramping. Off now.  Feels better.  Wants to take red yeast rice and fish oil instead of statin medication.  Low cholesterol diet and exercise.  Follow lipid panel.

## 2017-10-01 NOTE — Assessment & Plan Note (Signed)
Low carb diet and exercise.  Follow met b and a1c.   

## 2017-10-07 ENCOUNTER — Encounter: Payer: Self-pay | Admitting: Internal Medicine

## 2017-11-08 ENCOUNTER — Ambulatory Visit (INDEPENDENT_AMBULATORY_CARE_PROVIDER_SITE_OTHER): Payer: Medicare Other

## 2017-11-08 DIAGNOSIS — Z23 Encounter for immunization: Secondary | ICD-10-CM | POA: Diagnosis not present

## 2017-12-11 ENCOUNTER — Other Ambulatory Visit (INDEPENDENT_AMBULATORY_CARE_PROVIDER_SITE_OTHER): Payer: Medicare Other

## 2017-12-11 DIAGNOSIS — E7800 Pure hypercholesterolemia, unspecified: Secondary | ICD-10-CM

## 2017-12-11 DIAGNOSIS — I10 Essential (primary) hypertension: Secondary | ICD-10-CM | POA: Diagnosis not present

## 2017-12-11 DIAGNOSIS — E119 Type 2 diabetes mellitus without complications: Secondary | ICD-10-CM | POA: Diagnosis not present

## 2017-12-11 LAB — BASIC METABOLIC PANEL
BUN: 19 mg/dL (ref 6–23)
CHLORIDE: 108 meq/L (ref 96–112)
CO2: 27 mEq/L (ref 19–32)
CREATININE: 0.77 mg/dL (ref 0.40–1.20)
Calcium: 9.3 mg/dL (ref 8.4–10.5)
GFR: 77.37 mL/min (ref >60.00)
Glucose, Bld: 104 mg/dL — ABNORMAL HIGH (ref 70–99)
POTASSIUM: 4.1 meq/L (ref 3.5–5.1)
SODIUM: 142 meq/L (ref 135–145)

## 2017-12-11 LAB — CBC WITH DIFFERENTIAL/PLATELET
Basophils Absolute: 0 10*3/uL (ref 0.0–0.1)
Basophils Relative: 0.9 % (ref 0.0–3.0)
EOS ABS: 0.2 10*3/uL (ref 0.0–0.7)
EOS PCT: 4.7 % (ref 0.0–5.0)
HCT: 37 % (ref 36.0–46.0)
Hemoglobin: 12.5 g/dL (ref 12.0–15.0)
LYMPHS ABS: 2 10*3/uL (ref 0.7–4.0)
Lymphocytes Relative: 38.6 % (ref 12.0–46.0)
MCHC: 33.6 g/dL (ref 30.0–36.0)
MCV: 91.8 fl (ref 78.0–100.0)
MONO ABS: 0.5 10*3/uL (ref 0.1–1.0)
Monocytes Relative: 8.7 % (ref 3.0–12.0)
NEUTROS PCT: 47.1 % (ref 43.0–77.0)
Neutro Abs: 2.5 10*3/uL (ref 1.4–7.7)
Platelets: 229 10*3/uL (ref 150.0–400.0)
RBC: 4.04 Mil/uL (ref 3.87–5.11)
RDW: 13.2 % (ref 11.5–15.5)
WBC: 5.3 10*3/uL (ref 4.0–10.5)

## 2017-12-11 LAB — MICROALBUMIN / CREATININE URINE RATIO
CREATININE, U: 168.3 mg/dL
MICROALB/CREAT RATIO: 1.7 mg/g (ref 0.0–30.0)
Microalb, Ur: 2.9 mg/dL — ABNORMAL HIGH (ref 0.0–1.9)

## 2017-12-11 LAB — LIPID PANEL
CHOLESTEROL: 143 mg/dL (ref 0–200)
HDL: 52.3 mg/dL (ref >39.00)
LDL CALC: 81 mg/dL (ref 0–99)
NonHDL: 91.15
TRIGLYCERIDES: 50 mg/dL (ref 0.0–149.0)
Total CHOL/HDL Ratio: 3
VLDL: 10 mg/dL (ref 0.0–40.0)

## 2017-12-11 LAB — HEPATIC FUNCTION PANEL
ALBUMIN: 4.2 g/dL (ref 3.5–5.2)
ALK PHOS: 40 U/L (ref 39–117)
ALT: 11 U/L (ref 0–35)
AST: 14 U/L (ref 0–37)
Bilirubin, Direct: 0.2 mg/dL (ref 0.0–0.3)
Total Bilirubin: 1.4 mg/dL — ABNORMAL HIGH (ref 0.2–1.2)
Total Protein: 6.5 g/dL (ref 6.0–8.3)

## 2017-12-11 LAB — HEMOGLOBIN A1C: HEMOGLOBIN A1C: 6.3 % (ref 4.6–6.5)

## 2017-12-11 LAB — TSH: TSH: 3.8 u[IU]/mL (ref 0.35–4.50)

## 2017-12-12 ENCOUNTER — Encounter: Payer: Self-pay | Admitting: Internal Medicine

## 2017-12-31 IMAGING — MG MM DIGITAL SCREENING BILAT W/ TOMO W/ CAD
8 of 12 series · 8 of 28 positions shown · non-contrast
Comparison: Previous exam(s).

CLINICAL DATA: Screening.

EXAM:
2D DIGITAL SCREENING BILATERAL MAMMOGRAM WITH CAD AND ADJUNCT TOMO

[R CC synth-2D]
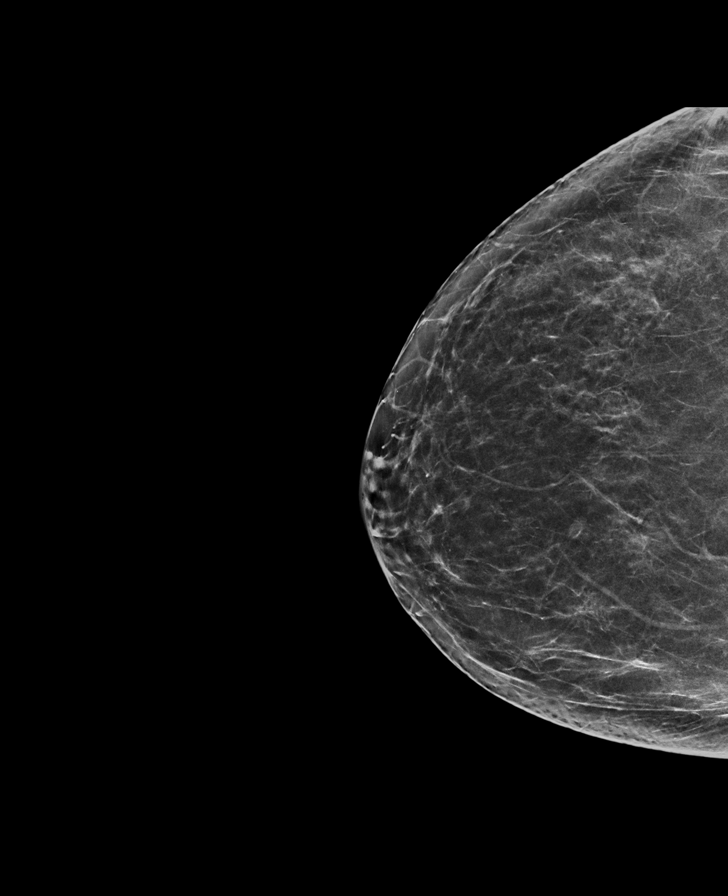

[L MLO synth-2D]
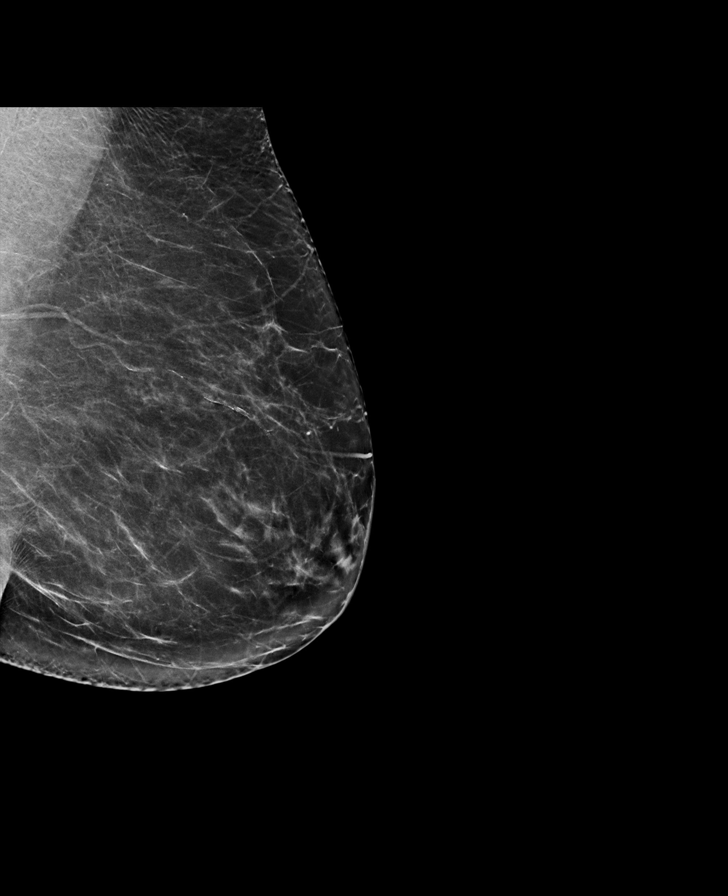

[R MLO]
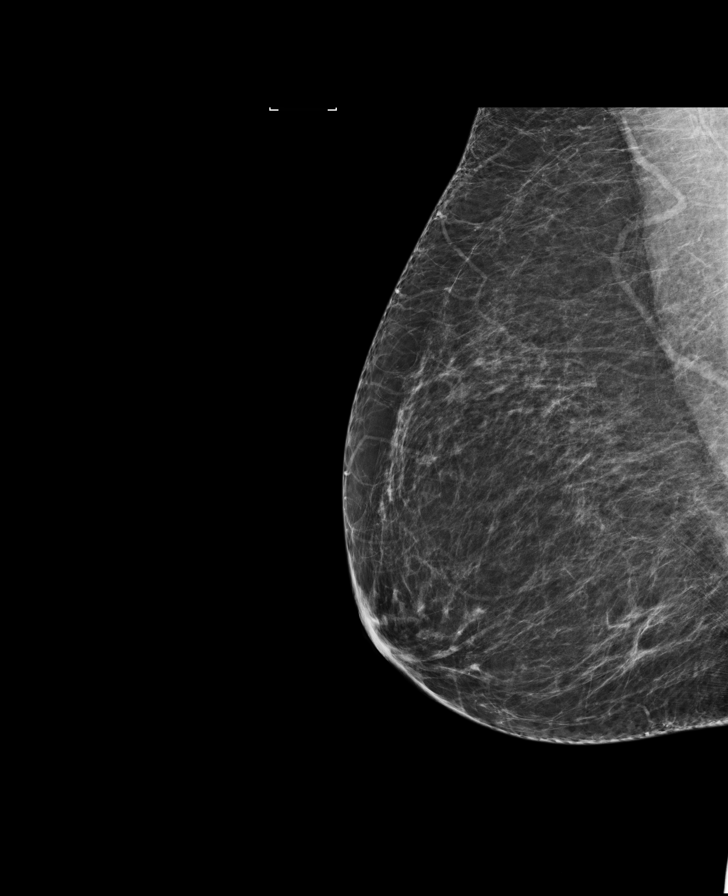

[R MLO synth-2D]
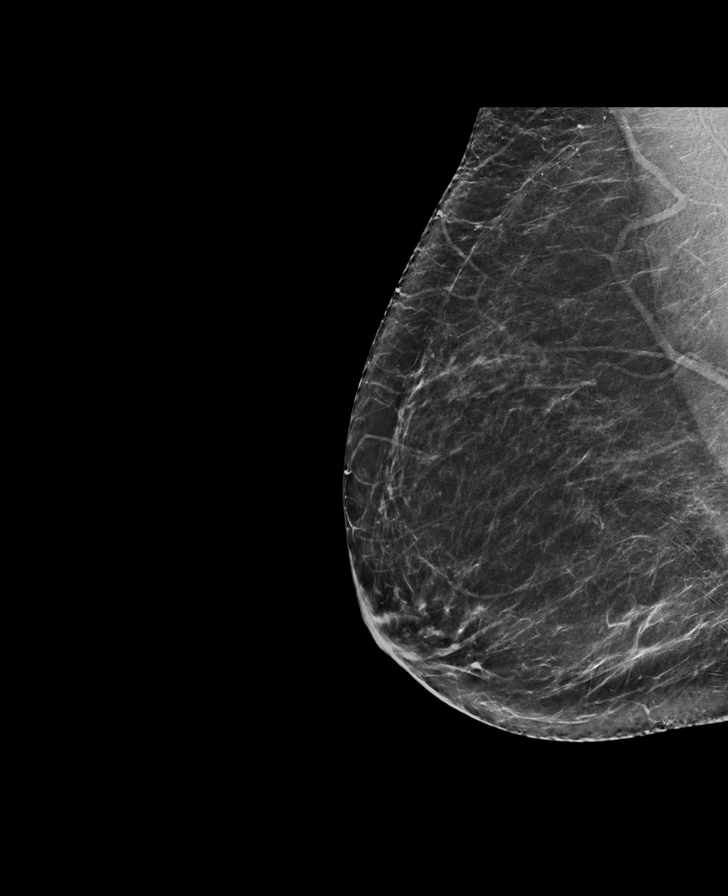

[L CC synth-2D]
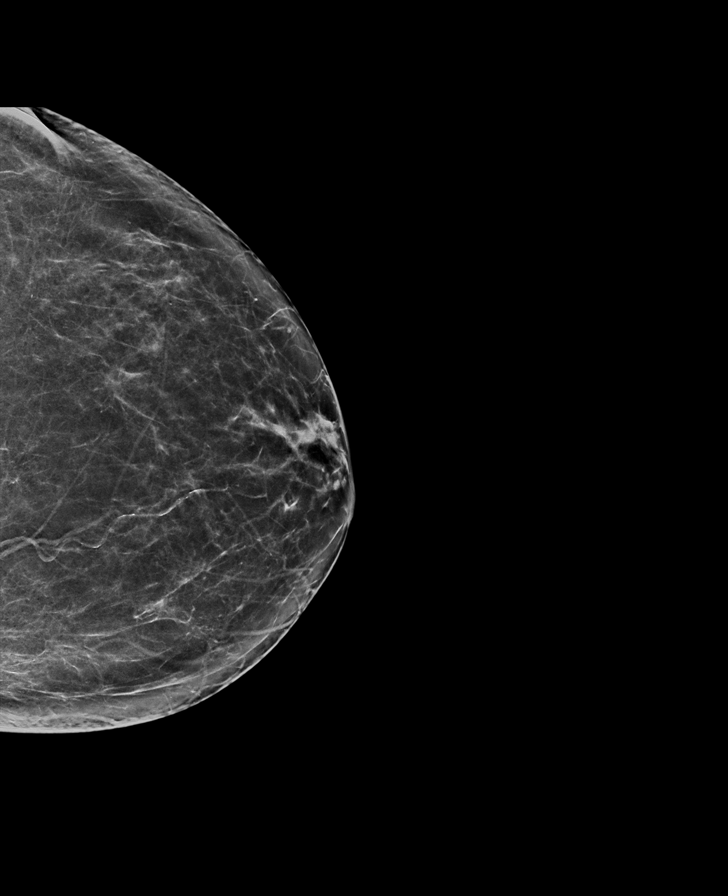

[R CC]
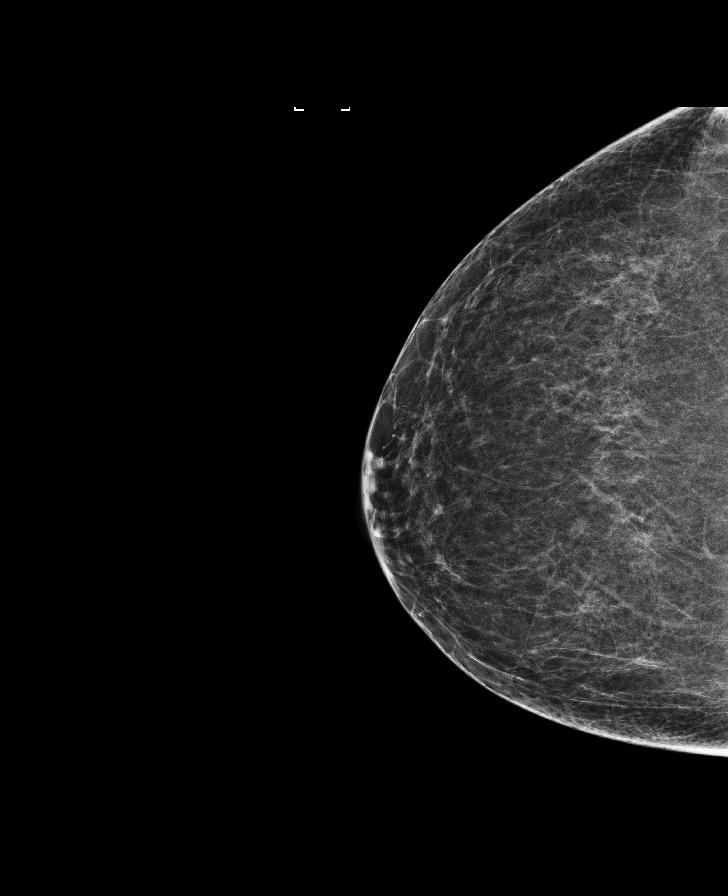

[L CC]
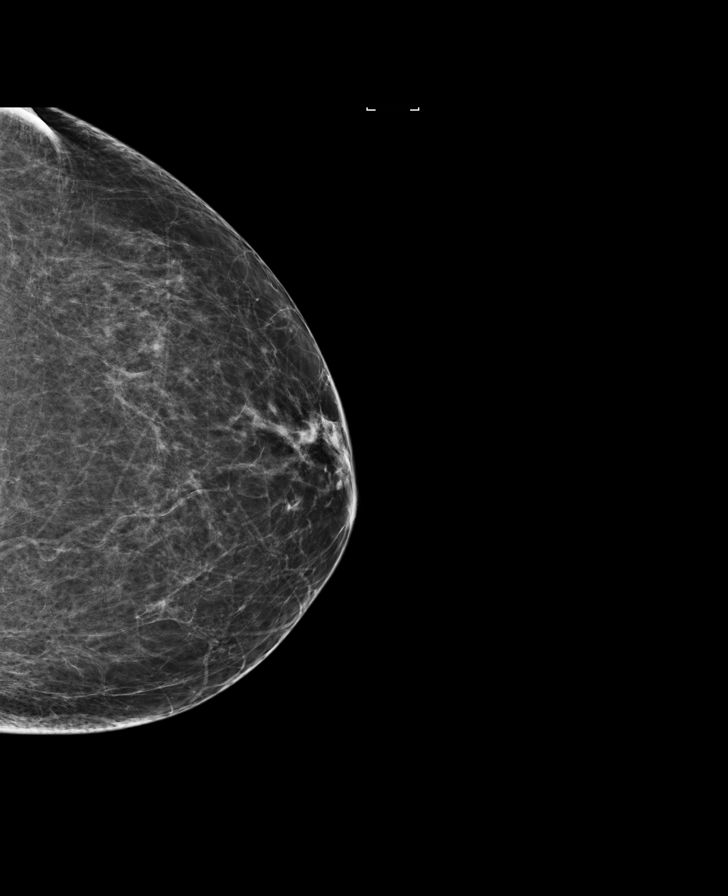

[L MLO]
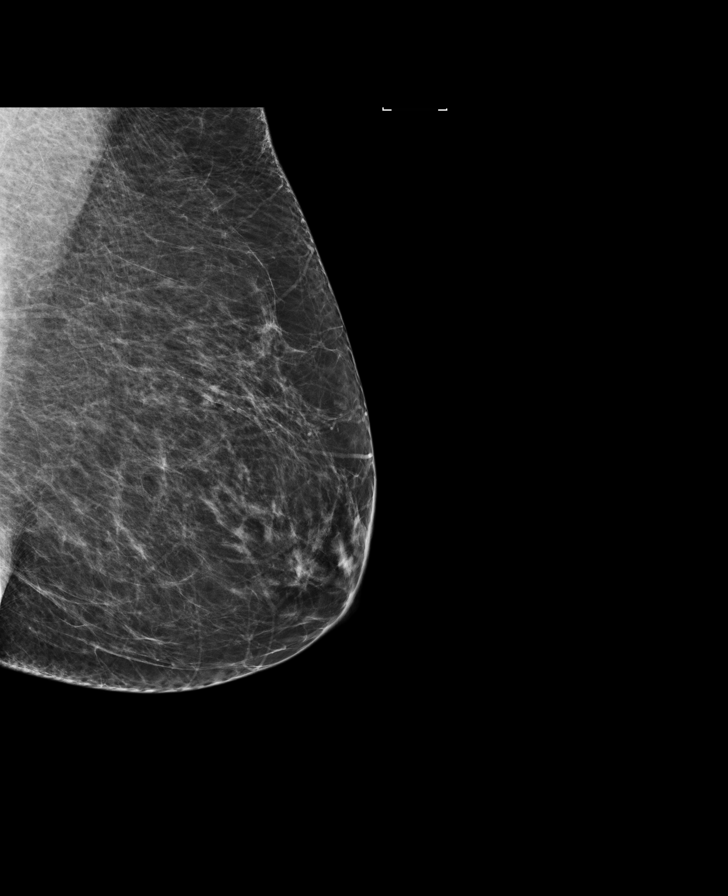

[8 of 28 positions shown; findings below may reference images not displayed]

ACR Breast Density Category b: There are scattered areas of
fibroglandular density.
FINDINGS: There are no findings suspicious for malignancy. Images were
processed with CAD.
IMPRESSION: No mammographic evidence of malignancy. A result letter of this
screening mammogram will be mailed directly to the patient.

RECOMMENDATION:
Screening mammogram in one year. (Code:97-6-RS4)

BI-RADS CATEGORY  1: Negative.

## 2018-01-01 ENCOUNTER — Other Ambulatory Visit: Payer: Self-pay | Admitting: Internal Medicine

## 2018-01-13 ENCOUNTER — Other Ambulatory Visit: Payer: Self-pay | Admitting: Internal Medicine

## 2018-01-20 ENCOUNTER — Ambulatory Visit: Payer: Medicare Other | Admitting: Internal Medicine

## 2018-01-20 ENCOUNTER — Encounter: Payer: Self-pay | Admitting: Internal Medicine

## 2018-01-20 VITALS — BP 140/76 | HR 65 | Temp 97.8°F | Resp 18 | Wt 178.0 lb

## 2018-01-20 DIAGNOSIS — E7800 Pure hypercholesterolemia, unspecified: Secondary | ICD-10-CM

## 2018-01-20 DIAGNOSIS — E119 Type 2 diabetes mellitus without complications: Secondary | ICD-10-CM | POA: Diagnosis not present

## 2018-01-20 DIAGNOSIS — R0981 Nasal congestion: Secondary | ICD-10-CM

## 2018-01-20 DIAGNOSIS — I10 Essential (primary) hypertension: Secondary | ICD-10-CM

## 2018-01-20 NOTE — Patient Instructions (Signed)
Saline nasal spray - flush nose at least 2-3x/day  nasacort nasal spray - 2 sprays each nostril one time per day.  Do this in the evening.  
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
## 2018-01-22 ENCOUNTER — Encounter: Payer: Self-pay | Admitting: Internal Medicine

## 2018-01-22 NOTE — Assessment & Plan Note (Signed)
crestor caused cramping.  Off now.  Feels better off.  Has adjusted her diet.  Low cholesterol diet and exercise.  Wants to hold on statin medication.  Follow lipid panel.

## 2018-01-22 NOTE — Assessment & Plan Note (Signed)
Blood pressure on recheck improved.  States outside checks averaging 130/70s.  Follow metabolic panel.  Same medication regimen.

## 2018-01-22 NOTE — Assessment & Plan Note (Signed)
She has adjusted her diet.  Lost weight.  Follow met b and a1c.   

## 2018-02-16 ENCOUNTER — Other Ambulatory Visit: Payer: Self-pay | Admitting: Internal Medicine

## 2018-07-06 ENCOUNTER — Other Ambulatory Visit: Payer: Self-pay | Admitting: Internal Medicine

## 2018-12-08 ENCOUNTER — Other Ambulatory Visit: Payer: Self-pay | Admitting: Internal Medicine

## 2018-12-08 DIAGNOSIS — Z1231 Encounter for screening mammogram for malignant neoplasm of breast: Secondary | ICD-10-CM

## 2018-12-11 ENCOUNTER — Encounter: Payer: Self-pay | Admitting: Internal Medicine

## 2018-12-16 ENCOUNTER — Ambulatory Visit
Admission: RE | Admit: 2018-12-16 | Discharge: 2018-12-16 | Disposition: A | Discharge status: Home / Self Care | Payer: Medicare Other | Source: Ambulatory Visit | Attending: Internal Medicine | Admitting: Internal Medicine

## 2018-12-16 DIAGNOSIS — Z1231 Encounter for screening mammogram for malignant neoplasm of breast: Secondary | ICD-10-CM | POA: Insufficient documentation

## 2018-12-16 NOTE — Telephone Encounter (Signed)
appt moved up to today

## 2018-12-18 ENCOUNTER — Telehealth: Payer: Self-pay | Admitting: Internal Medicine

## 2018-12-18 DIAGNOSIS — Z0000 Encounter for general adult medical examination without abnormal findings: Secondary | ICD-10-CM

## 2018-12-18 NOTE — Telephone Encounter (Signed)
Called and scheduled pt for 02/16/2019.Marland Kitchen

## 2018-12-18 NOTE — Telephone Encounter (Signed)
She is overdue a physical.  Please schedule.  Thanks.

## 2019-01-13 ENCOUNTER — Other Ambulatory Visit: Payer: Self-pay | Admitting: Internal Medicine

## 2019-02-03 ENCOUNTER — Other Ambulatory Visit: Payer: Self-pay | Admitting: Internal Medicine

## 2019-02-16 ENCOUNTER — Other Ambulatory Visit: Payer: Self-pay

## 2019-02-16 ENCOUNTER — Ambulatory Visit (INDEPENDENT_AMBULATORY_CARE_PROVIDER_SITE_OTHER): Payer: Medicare PPO | Admitting: Internal Medicine

## 2019-02-16 VITALS — BP 126/62 | Ht 64.0 in | Wt 178.0 lb

## 2019-02-16 DIAGNOSIS — I10 Essential (primary) hypertension: Secondary | ICD-10-CM

## 2019-02-16 DIAGNOSIS — Z86010 Personal history of colonic polyps: Secondary | ICD-10-CM

## 2019-02-16 DIAGNOSIS — E7800 Pure hypercholesterolemia, unspecified: Secondary | ICD-10-CM

## 2019-02-16 DIAGNOSIS — E119 Type 2 diabetes mellitus without complications: Secondary | ICD-10-CM | POA: Diagnosis not present

## 2019-02-16 DIAGNOSIS — F439 Reaction to severe stress, unspecified: Secondary | ICD-10-CM | POA: Diagnosis not present

## 2019-02-16 LAB — MICROALBUMIN / CREATININE URINE RATIO
Creatinine,U: 135.3 mg/dL
Microalb Creat Ratio: 3.6 mg/g (ref 0.0–30.0)
Microalb, Ur: 4.8 mg/dL — ABNORMAL HIGH (ref 0.0–1.9)

## 2019-02-16 LAB — CBC WITH DIFFERENTIAL/PLATELET
Basophils Absolute: 0 10*3/uL (ref 0.0–0.1)
Basophils Relative: 0.7 % (ref 0.0–3.0)
Eosinophils Absolute: 0.2 10*3/uL (ref 0.0–0.7)
Eosinophils Relative: 3.2 % (ref 0.0–5.0)
HCT: 37.6 % (ref 36.0–46.0)
Hemoglobin: 12.8 g/dL (ref 12.0–15.0)
Lymphocytes Relative: 30.6 % (ref 12.0–46.0)
Lymphs Abs: 1.7 10*3/uL (ref 0.7–4.0)
MCHC: 34.1 g/dL (ref 30.0–36.0)
MCV: 93.3 fl (ref 78.0–100.0)
Monocytes Absolute: 0.5 10*3/uL (ref 0.1–1.0)
Monocytes Relative: 8 % (ref 3.0–12.0)
Neutro Abs: 3.3 10*3/uL (ref 1.4–7.7)
Neutrophils Relative %: 57.5 % (ref 43.0–77.0)
Platelets: 251 10*3/uL (ref 150.0–400.0)
RBC: 4.03 Mil/uL (ref 3.87–5.11)
RDW: 13 % (ref 11.5–15.5)
WBC: 5.7 10*3/uL (ref 4.0–10.5)

## 2019-02-16 LAB — HEMOGLOBIN A1C: Hgb A1c MFr Bld: 6.3 % (ref 4.6–6.5)

## 2019-02-16 LAB — LIPID PANEL
Cholesterol: 169 mg/dL (ref 0–200)
HDL: 60.8 mg/dL (ref >39.00)
LDL Cholesterol: 97 mg/dL (ref 0–99)
NonHDL: 107.85
Total CHOL/HDL Ratio: 3
Triglycerides: 53 mg/dL (ref 0.0–149.0)
VLDL: 10.6 mg/dL (ref 0.0–40.0)

## 2019-02-16 LAB — BASIC METABOLIC PANEL
BUN: 18 mg/dL (ref 6–23)
CO2: 28 mEq/L (ref 19–32)
Calcium: 9.6 mg/dL (ref 8.4–10.5)
Chloride: 105 mEq/L (ref 96–112)
Creatinine, Ser: 0.74 mg/dL (ref 0.40–1.20)
GFR: 75.98 mL/min (ref >60.00)
Glucose, Bld: 106 mg/dL — ABNORMAL HIGH (ref 70–99)
Potassium: 4.3 mEq/L (ref 3.5–5.1)
Sodium: 142 mEq/L (ref 135–145)

## 2019-02-16 LAB — HEPATIC FUNCTION PANEL
ALT: 13 U/L (ref 0–35)
AST: 16 U/L (ref 0–37)
Albumin: 4.4 g/dL (ref 3.5–5.2)
Alkaline Phosphatase: 56 U/L (ref 39–117)
Bilirubin, Direct: 0.2 mg/dL (ref 0.0–0.3)
Total Bilirubin: 1.6 mg/dL — ABNORMAL HIGH (ref 0.2–1.2)
Total Protein: 6.8 g/dL (ref 6.0–8.3)

## 2019-02-16 LAB — TSH: TSH: 1.79 u[IU]/mL (ref 0.35–4.50)

## 2019-02-16 NOTE — Progress Notes (Signed)
Patient ID: Lori Guzman, female   DOB: 06-20-1940, 79 y.o.   MRN: [id number]   Virtual Visit via video Note  This visit type was conducted due to national recommendations for restrictions regarding the COVID-19 pandemic (e.g. social distancing).  This format is felt to be most appropriate for this patient at this time.  All issues noted in this document were discussed and addressed.  No physical exam was performed (except for noted visual exam findings with Video Visits).   I connected with Kianah Harries by a video enabled telemedicine application and verified that I am speaking with the correct person using two identifiers. Location patient: home Location provider: work  Persons participating in the virtual visit: patient, provider  The limitations, risks, security and privacy concerns of performing an evaluation and management service by video and the availability of in person appointments have been discussed.   The patient expressed understanding and agreed to proceed.   Reason for visit:  Scheduled physical  - due to covid - f/u appt.    HPI: Increased stress.  Husband recently passed away.  Has good family support.  Also increased stress with covid and covid restrictions.  Overall she feels she is handling things relatively well.  Stays active around her house.  Is walking.  No chest pain or sob.  No acid reflux reported.  No nausea, abdominal pain reported.  Taking metamucil to keep her bowels regular.  Off HCTZ.  States blood pressure was elevated with the increased stress, but appears to have leveled off now.  Blood pressure:  126/62.  Discussed remaining off hctz if blood pressure doing this well.  She is eating better.     ROS: See pertinent positives and negatives per HPI.  Past Medical History:  Diagnosis Date  . Allergy    reoccuring problems  . GERD (gastroesophageal reflux disease)   . History of colon polyps    adenomatous  . Hypertension     Past Surgical  History:  Procedure Laterality Date  . BARTHOLIN GLAND CYST EXCISION    . CHOLECYSTECTOMY    . [place]  . TONSILECTOMY/ADENOIDECTOMY WITH MYRINGOTOMY  1950    Family History  Problem Relation Age of Onset  . Heart disease Mother   . CVA Mother   . Hypertension Mother   . Alzheimer's disease Mother   . Esophageal cancer Father   . Breast cancer Neg Hx   . Colon cancer Neg Hx     SOCIAL HX: reviewed.    Current Outpatient Medications:  .  aspirin EC 81 MG tablet, Take 81 mg by mouth daily., Disp: , Rfl:  .  escitalopram (LEXAPRO) 10 MG tablet, TAKE 1 TABLET BY MOUTH EVERY DAY, Disp: 90 tablet, Rfl: 1 .  hydrochlorothiazide (HYDRODIURIL) 25 MG tablet, TAKE 1 TABLET BY MOUTH EVERY DAY, Disp: 90 tablet, Rfl: 1 .  mometasone (NASONEX) 50 MCG/ACT nasal spray, Place into the nose., Disp: , Rfl:  .  Red Yeast Rice Extract (RED YEAST RICE PO), Take by mouth., Disp: , Rfl:  .  telmisartan (MICARDIS) 80 MG tablet, TAKE 1 TABLET BY MOUTH EVERY DAY, Disp: 90 tablet, Rfl: 1  EXAM:  VITALS per patient if applicable: 641/58  GENERAL: alert, oriented, appears well and in no acute distress  HEENT: atraumatic, conjunttiva clear, no obvious abnormalities on inspection of external nose and ears  NECK: normal movements of the head and neck  LUNGS: on inspection no signs of respiratory distress, breathing  rate appears normal, no obvious gross SOB, gasping or wheezing  CV: no obvious cyanosis  PSYCH/NEURO: pleasant and cooperative, no obvious depression or anxiety, speech and thought processing grossly intact  ASSESSMENT AND PLAN:  Discussed the following assessment and plan:  Diabetes Low carb diet and exercise.  Follow met b and a1c.  Eating better.    History of colonic polyps Colonoscopy 03/2014.  Recommended f/u colonoscopy in 5 years.    Hypercholesterolemia crestor caused cramping.  Off now.  Low cholesterol diet and exercise.  Follow lipid panel.     Hypertension Blood pressure has leveled off.  Doing well now.  Follow pressures.  Follow metabolic panel.  Off hctz.  Continue micardis.    Stress Discussed stress with her today.  Overall doing relatively well.  Does not feel needs anything more at this time.  Continue on lexapro.  Has good upport.     Orders Placed This Encounter  Procedures  . CBC with Differential/Platelet  . Hemoglobin A1c  . Hepatic function panel  . Lipid panel  . TSH  . Basic metabolic panel (today)  . DM Microalbumin / creatinine urine ratio  . Hemoglobin A1c    Standing Status:   Future    Standing Expiration Date:   02/21/2020  . Hepatic function panel    Standing Status:   Future    Standing Expiration Date:   02/21/2020  . Lipid panel    Standing Status:   Future    Standing Expiration Date:   02/21/2020  . Basic metabolic panel (future)    Standing Status:   Future    Standing Expiration Date:   02/21/2020     I discussed the assessment and treatment plan with the patient. The patient was provided an opportunity to ask questions and all were answered. The patient agreed with the plan and demonstrated an understanding of the instructions.   The patient was advised to call back or seek an in-person evaluation if the symptoms worsen or if the condition fails to improve as anticipated.   Einar Pheasant, MD

## 2019-02-21 ENCOUNTER — Encounter: Payer: Self-pay | Admitting: Internal Medicine

## 2019-02-21 NOTE — Assessment & Plan Note (Signed)
Low carb diet and exercise.  Follow met b and a1c.  Eating better.

## 2019-02-21 NOTE — Assessment & Plan Note (Signed)
Discussed stress with her today.  Overall doing relatively well.  Does not feel needs anything more at this time.  Continue on lexapro.  Has good upport.

## 2019-02-21 NOTE — Assessment & Plan Note (Signed)
Blood pressure has leveled off.  Doing well now.  Follow pressures.  Follow metabolic panel.  Off hctz.  Continue micardis.

## 2019-02-21 NOTE — Assessment & Plan Note (Signed)
crestor caused cramping.  Off now.  Low cholesterol diet and exercise.  Follow lipid panel.

## 2019-02-21 NOTE — Assessment & Plan Note (Signed)
Colonoscopy 03/2014.  Recommended f/u colonoscopy in 5 years.

## 2019-03-01 ENCOUNTER — Encounter: Payer: Self-pay | Admitting: Internal Medicine

## 2019-03-03 DIAGNOSIS — H43821 Vitreomacular adhesion, right eye: Secondary | ICD-10-CM | POA: Diagnosis not present

## 2019-03-03 DIAGNOSIS — H353132 Nonexudative age-related macular degeneration, bilateral, intermediate dry stage: Secondary | ICD-10-CM | POA: Diagnosis not present

## 2019-03-24 ENCOUNTER — Encounter: Payer: Self-pay | Admitting: Internal Medicine

## 2019-06-09 DIAGNOSIS — L578 Other skin changes due to chronic exposure to nonionizing radiation: Secondary | ICD-10-CM | POA: Diagnosis not present

## 2019-06-09 DIAGNOSIS — L820 Inflamed seborrheic keratosis: Secondary | ICD-10-CM | POA: Diagnosis not present

## 2019-06-09 DIAGNOSIS — L298 Other pruritus: Secondary | ICD-10-CM | POA: Diagnosis not present

## 2019-06-09 DIAGNOSIS — L570 Actinic keratosis: Secondary | ICD-10-CM | POA: Diagnosis not present

## 2019-06-18 ENCOUNTER — Other Ambulatory Visit: Payer: Medicare PPO

## 2019-06-23 ENCOUNTER — Encounter: Payer: Medicare PPO | Admitting: Internal Medicine

## 2019-07-14 ENCOUNTER — Other Ambulatory Visit: Payer: Self-pay | Admitting: Internal Medicine

## 2019-07-30 ENCOUNTER — Other Ambulatory Visit: Payer: Self-pay | Admitting: Internal Medicine

## 2019-08-25 ENCOUNTER — Telehealth: Payer: Self-pay | Admitting: Internal Medicine

## 2019-08-25 NOTE — Telephone Encounter (Signed)
Left message for patient to call back and schedule Medicare Annual Wellness Visit (AWV) either virtually or audio only.  No hx of AWV; please schedule at anytime with Denisa O'Brien-Blaney at [facility]

## 2019-09-15 ENCOUNTER — Other Ambulatory Visit (INDEPENDENT_AMBULATORY_CARE_PROVIDER_SITE_OTHER): Payer: Medicare PPO

## 2019-09-15 ENCOUNTER — Other Ambulatory Visit: Payer: Self-pay

## 2019-09-15 DIAGNOSIS — E7800 Pure hypercholesterolemia, unspecified: Secondary | ICD-10-CM

## 2019-09-15 DIAGNOSIS — E119 Type 2 diabetes mellitus without complications: Secondary | ICD-10-CM | POA: Diagnosis not present

## 2019-09-15 LAB — LIPID PANEL
Cholesterol: 166 mg/dL (ref 0–200)
HDL: 60.7 mg/dL (ref >39.00)
LDL Cholesterol: 95 mg/dL (ref 0–99)
NonHDL: 104.81
Total CHOL/HDL Ratio: 3
Triglycerides: 50 mg/dL (ref 0.0–149.0)
VLDL: 10 mg/dL (ref 0.0–40.0)

## 2019-09-15 LAB — HEMOGLOBIN A1C: Hgb A1c MFr Bld: 6.5 % (ref 4.6–6.5)

## 2019-09-15 LAB — HEPATIC FUNCTION PANEL
ALT: 10 U/L (ref 0–35)
AST: 17 U/L (ref 0–37)
Albumin: 4.2 g/dL (ref 3.5–5.2)
Alkaline Phosphatase: 54 U/L (ref 39–117)
Bilirubin, Direct: 0.2 mg/dL (ref 0.0–0.3)
Total Bilirubin: 1.4 mg/dL — ABNORMAL HIGH (ref 0.2–1.2)
Total Protein: 6.6 g/dL (ref 6.0–8.3)

## 2019-09-15 LAB — BASIC METABOLIC PANEL
BUN: 17 mg/dL (ref 6–23)
CO2: 32 mEq/L (ref 19–32)
Calcium: 9.7 mg/dL (ref 8.4–10.5)
Chloride: 105 mEq/L (ref 96–112)
Creatinine, Ser: 0.7 mg/dL (ref 0.40–1.20)
GFR: 80.89 mL/min (ref >60.00)
Glucose, Bld: 94 mg/dL (ref 70–99)
Potassium: 4.5 mEq/L (ref 3.5–5.1)
Sodium: 141 mEq/L (ref 135–145)

## 2019-09-17 ENCOUNTER — Encounter: Payer: Self-pay | Admitting: Internal Medicine

## 2019-09-17 ENCOUNTER — Other Ambulatory Visit: Payer: Self-pay

## 2019-09-17 ENCOUNTER — Ambulatory Visit (INDEPENDENT_AMBULATORY_CARE_PROVIDER_SITE_OTHER): Payer: Medicare PPO | Admitting: Internal Medicine

## 2019-09-17 VITALS — BP 128/78 | HR 65 | Temp 98.4°F | Resp 16 | Ht 64.0 in | Wt 179.0 lb

## 2019-09-17 DIAGNOSIS — E7800 Pure hypercholesterolemia, unspecified: Secondary | ICD-10-CM

## 2019-09-17 DIAGNOSIS — F439 Reaction to severe stress, unspecified: Secondary | ICD-10-CM

## 2019-09-17 DIAGNOSIS — E1165 Type 2 diabetes mellitus with hyperglycemia: Secondary | ICD-10-CM

## 2019-09-17 DIAGNOSIS — Z1211 Encounter for screening for malignant neoplasm of colon: Secondary | ICD-10-CM | POA: Diagnosis not present

## 2019-09-17 DIAGNOSIS — Z0000 Encounter for general adult medical examination without abnormal findings: Secondary | ICD-10-CM | POA: Diagnosis not present

## 2019-09-17 DIAGNOSIS — I10 Essential (primary) hypertension: Secondary | ICD-10-CM | POA: Diagnosis not present

## 2019-09-17 MED ORDER — ROSUVASTATIN CALCIUM 5 MG PO TABS: 5.0000 mg | ORAL_TABLET | Freq: Every day | ORAL | 1 refills | Status: DC

## 2019-09-17 NOTE — Progress Notes (Signed)
Patient ID: Lori Guzman, female   DOB: [birth year], 79 y.o.   MRN: [id number]   Subjective:    Patient ID: Lori Guzman, female    DOB: 11-23-1940, 79 y.o.   MRN: [id number]  HPI This visit occurred during the SARS-CoV-2 public health emergency.  Safety protocols were in place, including screening questions prior to the visit, additional usage of staff PPE, and extensive cleaning of exam room while observing appropriate contact time as indicated for disinfecting solutions.  Patient here for her physical exam.  Increased stress.  Trying to cope with her husband's death.  Has good support.  Discussed with her today.  Does not feel needs any further intervention at this time.  Trying to stay active.  No chest pain or sob.  No acid reflux.  No abdominal pain.  Bowels moving.  Discussed labs. Discussed a1c 6.5.  Low carb diet and exercise.    Past Medical History:  Diagnosis Date  . Allergy    reoccuring problems  . GERD (gastroesophageal reflux disease)   . History of colon polyps    adenomatous  . Hypertension    Past Surgical History:  Procedure Laterality Date  . BARTHOLIN GLAND CYST EXCISION    . CHOLECYSTECTOMY    . [place]  . TONSILECTOMY/ADENOIDECTOMY WITH MYRINGOTOMY  1950   Family History  Problem Relation Age of Onset  . Heart disease Mother   . CVA Mother   . Hypertension Mother   . Alzheimer's disease Mother   . Esophageal cancer Father   . Breast cancer Neg Hx   . Colon cancer Neg Hx    Social History   Socioeconomic History  . Marital status: Married    Spouse name: Not on file  . Number of children: Not on file  . Years of education: Not on file  . Highest education level: Not on file  Occupational History  . Not on file  Tobacco Use  . Smoking status: Never Smoker  . Smokeless tobacco: Never Used  Substance and Sexual Activity  . Alcohol use: No    Alcohol/week: 0.0 standard drinks  . Drug use: No  . Sexual activity: Not on  file  Other Topics Concern  . Not on file  Social History Narrative   She is married, has 3 children and is retired.   Social Determinants of Health   Financial Resource Strain:   . Difficulty of Paying Living Expenses:   Food Insecurity:   . Worried About Charity fundraiser in the Last Year:   . Arboriculturist in the Last Year:   Transportation Needs:   . Film/video editor (Medical):   Marland Kitchen Lack of Transportation (Non-Medical):   Physical Activity:   . Days of Exercise per Week:   . Minutes of Exercise per Session:   Stress:   . Feeling of Stress :   Social Connections:   . Frequency of Communication with Friends and Family:   . Frequency of Social Gatherings with Friends and Family:   . Attends Religious Services:   . Active Member of Clubs or Organizations:   . Attends Archivist Meetings:   Marland Kitchen Marital Status:     Outpatient Encounter Medications as of 09/17/2019  Medication Sig  . aspirin EC 81 MG tablet Take 81 mg by mouth daily.  Marland Kitchen escitalopram (LEXAPRO) 10 MG tablet TAKE 1 TABLET BY MOUTH EVERY DAY  . hydrochlorothiazide (HYDRODIURIL) 25 MG tablet TAKE  1 TABLET BY MOUTH EVERY DAY  . mometasone (NASONEX) 50 MCG/ACT nasal spray Place into the nose.  . Red Yeast Rice Extract (RED YEAST RICE PO) Take by mouth.  . rosuvastatin (CRESTOR) 5 MG tablet Take 1 tablet (5 mg total) by mouth daily.  Marland Kitchen telmisartan (MICARDIS) 80 MG tablet TAKE 1 TABLET BY MOUTH EVERY DAY   No facility-administered encounter medications on file as of 09/17/2019.    Review of Systems  Constitutional: Negative for appetite change and unexpected weight change.  HENT: Negative for congestion and sinus pressure.   Eyes: Negative for pain and visual disturbance.  Respiratory: Negative for cough, chest tightness and shortness of breath.   Cardiovascular: Negative for chest pain, palpitations and leg swelling.  Gastrointestinal: Negative for abdominal pain, diarrhea, nausea and vomiting.    Genitourinary: Negative for difficulty urinating and dysuria.  Musculoskeletal: Negative for joint swelling and myalgias.  Skin: Negative for color change and rash.  Neurological: Negative for dizziness, light-headedness and headaches.  Hematological: Negative for adenopathy. Does not bruise/bleed easily.  Psychiatric/Behavioral: Negative for agitation and dysphoric mood.       Objective:    Physical Exam Vitals reviewed.  Constitutional:      General: She is not in acute distress.    Appearance: Normal appearance. She is well-developed.  HENT:     Head: Normocephalic and atraumatic.     Right Ear: External ear normal.     Left Ear: External ear normal.  Eyes:     General: No scleral icterus.       Right eye: No discharge.        Left eye: No discharge.     Conjunctiva/sclera: Conjunctivae normal.  Neck:     Thyroid: No thyromegaly.  Cardiovascular:     Rate and Rhythm: Normal rate and regular rhythm.  Pulmonary:     Effort: No tachypnea, accessory muscle usage or respiratory distress.     Breath sounds: Normal breath sounds. No decreased breath sounds or wheezing.  Chest:     Breasts:        Right: No inverted nipple, mass, nipple discharge or tenderness (no axillary adenopathy).        Left: No inverted nipple, mass, nipple discharge or tenderness (no axilarry adenopathy).  Abdominal:     General: Bowel sounds are normal.     Palpations: Abdomen is soft.     Tenderness: There is no abdominal tenderness.  Musculoskeletal:        General: No swelling or tenderness.     Cervical back: Neck supple. No tenderness.  Lymphadenopathy:     Cervical: No cervical adenopathy.  Skin:    Findings: No erythema or rash.  Neurological:     Mental Status: She is alert and oriented to person, place, and time.  Psychiatric:        Mood and Affect: Mood normal.        Behavior: Behavior normal.     BP 128/78   Pulse 65   Temp 98.4 F (36.9 C)   Resp 16   Ht 5' 4"  (1.626 m)    Wt 179 lb (81.2 kg)   LMP 02/29/1992   SpO2 98%   BMI 30.73 kg/m  Wt Readings from Last 3 Encounters:  09/17/19 179 lb (81.2 kg)  02/16/19 178 lb (80.7 kg)  01/20/18 178 lb (80.7 kg)     Lab Results  Component Value Date   WBC 5.7 02/16/2019   HGB 12.8 02/16/2019  HCT 37.6 02/16/2019   PLT 251.0 02/16/2019   GLUCOSE 94 09/15/2019   CHOL 166 09/15/2019   TRIG 50.0 09/15/2019   HDL 60.70 09/15/2019   LDLCALC 95 09/15/2019   ALT 10 09/15/2019   AST 17 09/15/2019   NA 141 09/15/2019   K 4.5 09/15/2019   CL 105 09/15/2019   CREATININE 0.70 09/15/2019   BUN 17 09/15/2019   CO2 32 09/15/2019   TSH 1.79 02/16/2019   HGBA1C 6.5 09/15/2019   MICROALBUR 4.8 (H) 02/16/2019    MM 3D SCREEN BREAST BILATERAL  Result Date: 12/17/2018 CLINICAL DATA:  Screening. EXAM: DIGITAL SCREENING BILATERAL MAMMOGRAM WITH TOMO AND CAD COMPARISON:  Previous exam(s). ACR Breast Density Category b: There are scattered areas of fibroglandular density. FINDINGS: There are no findings suspicious for malignancy. Images were processed with CAD. IMPRESSION: No mammographic evidence of malignancy. A result letter of this screening mammogram will be mailed directly to the patient. RECOMMENDATION: Screening mammogram in one year. (Code:SM-B-01Y) BI-RADS CATEGORY  1: Negative. Electronically Signed   By: Lillia Mountain M.D.   On: 12/17/2018 13:35       Assessment & Plan:   Problem List Items Addressed This Visit    Diabetes ([place])    Low carb diet and exercise.  Follow met b and a1c.        Relevant Medications   rosuvastatin (CRESTOR) 5 MG tablet   Health care maintenance    Physical today 09/17/19.  Mammogram 12/17/18 - birads I.  colonosocpy 03/2014 - normal.  F/u in 5 years.  Due.  Refer to GI for colonoscopy.        Hypercholesterolemia    The 10-year ASCVD risk score Mikey Bussing DC Jr., et al., 2013) is: 48.1%   Values used to calculate the score:     Age: 58 years     Sex: Female     Is Non-Hispanic  African American: No     Diabetic: Yes     Tobacco smoker: No     Systolic Blood Pressure: 710 mmHg     Is BP treated: Yes     HDL Cholesterol: 60.7 mg/dL     Total Cholesterol: 166 mg/dL  Discussed calculated cholesterol risk.  Discussed starting crestor.  She is agreeable.  Start crestor 70m two days a week.  Follow lipid panel and liver function tests.        Relevant Medications   rosuvastatin (CRESTOR) 5 MG tablet   Other Relevant Orders   Hepatic function panel   Hypertension    Blood pressure as outlined.  Continue hctz and micardis.  Follow pressures.  Follow metabolic panel.       Relevant Medications   rosuvastatin (CRESTOR) 5 MG tablet   Stress    Increased stress as outlined.  Discussed with her today.  Has good support.  On lexapro.  Does not feel needs any further intervention at this time.  Follow.        Other Visit Diagnoses    Colon cancer screening    -  Primary   Relevant Orders   Ambulatory referral to Gastroenterology       CEinar Pheasant MD

## 2019-09-17 NOTE — Assessment & Plan Note (Addendum)
The 10-year ASCVD risk score Mikey Bussing DC Brooke Bonito., et al., 2013) is: 48.1%   Values used to calculate the score:     Age: 79 years     Sex: Female     Is Non-Hispanic African American: No     Diabetic: Yes     Tobacco smoker: No     Systolic Blood Pressure: 622 mmHg     Is BP treated: Yes     HDL Cholesterol: 60.7 mg/dL     Total Cholesterol: 166 mg/dL  Discussed calculated cholesterol risk.  Discussed starting crestor.  She is agreeable.  Start crestor 5mg  two days a week.  Follow lipid panel and liver function tests.

## 2019-09-19 ENCOUNTER — Encounter: Payer: Self-pay | Admitting: Internal Medicine

## 2019-09-19 NOTE — Assessment & Plan Note (Signed)
Blood pressure as outlined.  Continue hctz and micardis.  Follow pressures.  Follow metabolic panel.

## 2019-09-19 NOTE — Assessment & Plan Note (Signed)
Physical today 09/17/19.  Mammogram 12/17/18 - birads I.  colonosocpy 03/2014 - normal.  F/u in 5 years.  Due.  Refer to GI for colonoscopy.

## 2019-09-19 NOTE — Assessment & Plan Note (Signed)
Low carb diet and exercise.  Follow met b and a1c.   

## 2019-09-19 NOTE — Assessment & Plan Note (Signed)
Increased stress as outlined.  Discussed with her today.  Has good support.  On lexapro.  Does not feel needs any further intervention at this time.  Follow.

## 2019-09-25 ENCOUNTER — Other Ambulatory Visit: Payer: Self-pay | Admitting: Internal Medicine

## 2019-09-28 ENCOUNTER — Encounter: Payer: Self-pay | Admitting: Internal Medicine

## 2019-09-28 ENCOUNTER — Other Ambulatory Visit: Payer: Self-pay | Admitting: Internal Medicine

## 2019-09-28 DIAGNOSIS — Z1211 Encounter for screening for malignant neoplasm of colon: Secondary | ICD-10-CM

## 2019-09-28 NOTE — Progress Notes (Signed)
Order placed for GI referral.   

## 2019-10-06 DIAGNOSIS — H31092 Other chorioretinal scars, left eye: Secondary | ICD-10-CM | POA: Diagnosis not present

## 2019-10-06 DIAGNOSIS — H2513 Age-related nuclear cataract, bilateral: Secondary | ICD-10-CM | POA: Diagnosis not present

## 2019-10-12 ENCOUNTER — Other Ambulatory Visit: Payer: Self-pay | Admitting: Internal Medicine

## 2019-10-22 ENCOUNTER — Other Ambulatory Visit: Payer: Self-pay | Admitting: Internal Medicine

## 2019-10-29 ENCOUNTER — Other Ambulatory Visit: Payer: Self-pay

## 2019-10-29 ENCOUNTER — Other Ambulatory Visit (INDEPENDENT_AMBULATORY_CARE_PROVIDER_SITE_OTHER): Payer: Medicare PPO

## 2019-10-29 DIAGNOSIS — E7800 Pure hypercholesterolemia, unspecified: Secondary | ICD-10-CM

## 2019-10-29 LAB — HEPATIC FUNCTION PANEL
ALT: 12 U/L (ref 0–35)
AST: 16 U/L (ref 0–37)
Albumin: 4.3 g/dL (ref 3.5–5.2)
Alkaline Phosphatase: 52 U/L (ref 39–117)
Bilirubin, Direct: 0.2 mg/dL (ref 0.0–0.3)
Total Bilirubin: 1.1 mg/dL (ref 0.2–1.2)
Total Protein: 6.7 g/dL (ref 6.0–8.3)

## 2019-11-25 ENCOUNTER — Ambulatory Visit: Payer: Medicare PPO | Admitting: Gastroenterology

## 2020-01-14 ENCOUNTER — Other Ambulatory Visit: Payer: Medicare PPO

## 2020-01-19 ENCOUNTER — Ambulatory Visit: Payer: Medicare PPO | Admitting: Internal Medicine

## 2020-01-19 ENCOUNTER — Ambulatory Visit (INDEPENDENT_AMBULATORY_CARE_PROVIDER_SITE_OTHER): Payer: Medicare PPO

## 2020-01-19 VITALS — BP 135/75 | Ht 64.0 in | Wt 179.0 lb

## 2020-01-19 DIAGNOSIS — Z0000 Encounter for general adult medical examination without abnormal findings: Secondary | ICD-10-CM

## 2020-01-19 NOTE — Progress Notes (Addendum)
Subjective:   Lori Guzman is a 79 y.o. female who presents for an Initial Medicare Annual Wellness Visit.  Review of Systems    No ROS.  Medicare Wellness Virtual Visit.    Cardiac Risk Factors include: advanced age (>39men, >41 women);hypertension;diabetes mellitus     Objective:    Today's Vitals   01/19/20 1035  BP: 135/75  Weight: 179 lb (81.2 kg)  Height: 5\' 4"  (1.626 m)   Body mass index is 30.73 kg/m.  Advanced Directives 01/19/2020  Does Patient Have a Medical Advance Directive? Yes  Type of [place] of [place];Living will  Does patient want to make changes to medical advance directive? No - Patient declined  Copy of [place] in Chart? No - copy requested    Current Medications (verified) Outpatient Encounter Medications as of 01/19/2020  Medication Sig  . aspirin EC 81 MG tablet Take 81 mg by mouth daily.  Marland Kitchen escitalopram (LEXAPRO) 10 MG tablet TAKE 1 TABLET BY MOUTH EVERY DAY  . mometasone (NASONEX) 50 MCG/ACT nasal spray Place into the nose.  . rosuvastatin (CRESTOR) 5 MG tablet TAKE 1 TABLET BY MOUTH EVERY DAY  . telmisartan (MICARDIS) 80 MG tablet TAKE 1 TABLET BY MOUTH EVERY DAY  . hydrochlorothiazide (HYDRODIURIL) 25 MG tablet TAKE 1 TABLET BY MOUTH EVERY DAY (Patient not taking: Reported on 01/19/2020)  . Red Yeast Rice Extract (RED YEAST RICE PO) Take by mouth. (Patient not taking: Reported on 01/19/2020)   No facility-administered encounter medications on file as of 01/19/2020.    Allergies (verified) Patient has no known allergies.   History: Past Medical History:  Diagnosis Date  . Allergy    reoccuring problems  . GERD (gastroesophageal reflux disease)   . History of colon polyps    adenomatous  . Hypertension    Past Surgical History:  Procedure Laterality Date  . BARTHOLIN GLAND CYST EXCISION    . CHOLECYSTECTOMY    . [place]  . TONSILECTOMY/ADENOIDECTOMY WITH  MYRINGOTOMY  1950   Family History  Problem Relation Age of Onset  . Heart disease Mother   . CVA Mother   . Hypertension Mother   . Alzheimer's disease Mother   . Esophageal cancer Father   . Breast cancer Neg Hx   . Colon cancer Neg Hx    Social History   Socioeconomic History  . Marital status: Widowed    Spouse name: Not on file  . Number of children: Not on file  . Years of education: Not on file  . Highest education level: Not on file  Occupational History  . Not on file  Tobacco Use  . Smoking status: Never Smoker  . Smokeless tobacco: Never Used  Substance and Sexual Activity  . Alcohol use: No    Alcohol/week: 0.0 standard drinks  . Drug use: No  . Sexual activity: Not on file  Other Topics Concern  . Not on file  Social History Narrative   She is married, has 3 children and is retired.   Social Determinants of Health   Financial Resource Strain: Low Risk   . Difficulty of Paying Living Expenses: Not hard at all  Food Insecurity: No Food Insecurity  . Worried About Charity fundraiser in the Last Year: Never true  . Ran Out of Food in the Last Year: Never true  Transportation Needs: No Transportation Needs  . Lack of Transportation (Medical): No  . Lack of  Transportation (Non-Medical): No  Physical Activity:   . Days of Exercise per Week: Not on file  . Minutes of Exercise per Session: Not on file  Stress: No Stress Concern Present  . Feeling of Stress : Only a little  Social Connections: Moderately Integrated  . Frequency of Communication with Friends and Family: More than three times a week  . Frequency of Social Gatherings with Friends and Family: More than three times a week  . Attends Religious Services: 1 to 4 times per year  . Active Member of Clubs or Organizations: Yes  . Attends Archivist Meetings: Not on file  . Marital Status: Widowed    Tobacco Counseling Counseling given: Not Answered   Clinical Intake:  Pre-visit  preparation completed: Yes        Diabetes: Yes (Followed by PCP)  How often do you need to have someone help you when you read instructions, pamphlets, or other written materials from your doctor or pharmacy?: 1 - Never    Interpreter Needed?: No    Activities of Daily Living In your present state of health, do you have any difficulty performing the following activities: 01/19/2020  Hearing? N  Vision? N  Difficulty concentrating or making decisions? N  Walking or climbing stairs? N  Dressing or bathing? N  Doing errands, shopping? N  Preparing Food and eating ? N  Using the Toilet? N  In the past six months, have you accidently leaked urine? N  Do you have problems with loss of bowel control? N  Managing your Medications? N  Managing your Finances? N  Housekeeping or managing your Housekeeping? N  Some recent data might be hidden    Patient Care Team: Einar Pheasant, MD as PCP - General (Internal Medicine)  Indicate any recent Medical Services you may have received from other than [facility] providers in the past year (date may be approximate).     Assessment:   This is a routine wellness examination for Latesa.  I connected with Tarisa today by telephone and verified that I am speaking with the correct person using two identifiers. Location patient: home Location provider: work Persons participating in the virtual visit: patient, Marine scientist.    I discussed the limitations, risks, security and privacy concerns of performing an evaluation and management service by telephone and the availability of in person appointments. The patient expressed understanding and verbally consented to this telephonic visit.    Interactive audio and video telecommunications were attempted between this provider and patient, however failed, due to patient having technical difficulties OR patient did not have access to video capability.  We continued and completed visit with audio only.  Some vital  signs may be absent or patient reported.   Hearing/Vision screen  Hearing Screening   125Hz  250Hz  500Hz  1000Hz  2000Hz  3000Hz  4000Hz  6000Hz  8000Hz   Right ear:           Left ear:           Comments: Patient is able to hear conversational tones without difficulty.  No issues reported.  Vision Screening Comments: Followed by  Wears corrective lenses Cataract extraction, bilateral Visual acuity not assessed, virtual visit.  They have seen their ophthalmologist in the last 12 months.    Dietary issues and exercise activities discussed: Current Exercise Habits: Home exercise routine, Intensity: Mild  Healthy diet Fair water intake  Goals      Patient Stated   .  DIET - INCREASE WATER INTAKE (pt-stated)  Stay hydrated      Depression Screen PHQ 2/9 Scores 01/19/2020 02/16/2019 10/31/2016 01/09/2016 11/15/2015 08/09/2014 07/13/2013  PHQ - 2 Score 1 - 0 0 0 0 0  PHQ- 9 Score - - 0 - - - -  Exception Documentation - Other- indicate reason in comment box - - - - -  Not completed - husband recently passed away. discuss with pcp - - - - -    Fall Risk Fall Risk  01/19/2020 02/16/2019 02/28/2017 01/09/2016 11/15/2015  Falls in the past year? 0 0 No No No  Number falls in past yr: 0 - - - -  Injury with Fall? 0 - - - -  Follow up Falls evaluation completed Falls evaluation completed - - -    FALL RISK PREVENTION PERTAINING TO THE HOME: Handrails in use when climbing stairs? Yes Home free of loose throw rugs in walkways, pet beds, electrical cords, etc? Yes  Adequate lighting in your home to reduce risk of falls? Yes   ASSISTIVE DEVICES UTILIZED TO PREVENT FALLS: Use of a cane, walker or w/c? No   TIMED UP AND GO: Was the test performed? No . Virtual visit.   Cognitive Function:  Patient is alert and oriented x3.   6CIT Screen 01/19/2020  What Year? 0 points  What month? 0 points  What time? 0 points  Count back from 20 0 points  Months in reverse 0 points  Repeat phrase 0 points   Total Score 0    Immunizations Immunization History  Administered Date(s) Administered  . Fluad Quad(high Dose 65+) 01/13/2020  . Influenza Split 02/29/2012  . Influenza, High Dose Seasonal PF 12/05/2016, 12/29/2018  . Influenza,inj,Quad PF,6+ Mos 01/19/2013, 11/30/2013, 12/09/2014, 11/08/2017  . Influenza-Unspecified 11/15/2015  . PFIZER SARS-COV-2 Vaccination 04/01/2019, 04/27/2019  . Pneumococcal Conjugate-13 08/09/2014  . Pneumococcal Polysaccharide-23 06/09/2012    TDAP status: Due, Education has been provided regarding the importance of this vaccine. Advised may receive this vaccine at local pharmacy or Health Dept. Aware to provide a copy of the vaccination record if obtained from local pharmacy or Health Dept. Verbalized acceptance and understanding. Deferred.   Health Maintenance Health Maintenance  Topic Date Due  . COLONOSCOPY  04/07/2019  . MAMMOGRAM  12/16/2019  . FOOT EXAM  03/22/2020 (Originally 07/04/1950)  . TETANUS/TDAP  03/22/2020 (Originally [birth year]1960)  . Hepatitis C Screening  01/18/2021 (Originally 08-09-1940)  . HEMOGLOBIN A1C  03/17/2020  . OPHTHALMOLOGY EXAM  10/27/2020  . INFLUENZA VACCINE  Completed  . DEXA SCAN  Completed  . COVID-19 Vaccine  Completed  . PNA vac Low Risk Adult  Completed   Colorectal cancer screening: Type of screening: Colonoscopy. Completed 04/06/14. Repeat every 5 years. Plans to complete consultation in January.   Mammogram status: Completed 12/16/18. Repeat every year. Plans to schedule. 3D Screening, Bilateral.  Bone Density- 06/28/16.   Foot exam- followed by PCP.   Lung Cancer Screening: (Low Dose CT Chest recommended if Age 34-80 years, 30 pack-year currently smoking OR have quit w/in 15years.) does not qualify.   Hepatitis C Screening: does not qualify.   Vision Screening: Recommended annual ophthalmology exams for early detection of glaucoma and other disorders of the eye. Is the patient up to date with their annual  eye exam?  Yes  Who is the provider or what is the name of the office in which the patient attends annual eye exams? Kathyrn Lass, [place].  [place].   Dental Screening: Recommended annual dental exams for  proper oral hygiene. Visits every 3 months.   Community Resource Referral / Chronic Care Management: CRR required this visit?  No   CCM required this visit?  No      Plan:   Keep all routine maintenance appointments.   Next scheduled lab 03/18/20 @ 8:15  Follow up 03/22/20 @ 1:30  I have personally reviewed and noted the following in the patient's chart:   . Medical and social history . Use of alcohol, tobacco or illicit drugs  . Current medications and supplements . Functional ability and status . Nutritional status . Physical activity . Advanced directives . List of other physicians . Hospitalizations, surgeries, and ER visits in previous 12 months . Vitals . Screenings to include cognitive, depression, and falls . Referrals and appointments  In addition, I have reviewed and discussed with patient certain preventive protocols, quality metrics, and best practice recommendations. A written personalized care plan for preventive services as well as general preventive health recommendations were provided to patient via mychart.     Varney Biles, LPN   [id number]     Reviewed above information.  Agree with assessment and plan.    Dr Nicki Reaper

## 2020-01-19 NOTE — Patient Instructions (Addendum)
Lori Guzman , Thank you for taking time to come for your Medicare Wellness Visit. I appreciate your ongoing commitment to your health goals. Please review the following plan we discussed and let me know if I can assist you in the future.   These are the goals we discussed: Goals      Patient Stated   .  DIET - INCREASE WATER INTAKE (pt-stated)      Stay hydrated       This is a list of the screening recommended for you and due dates:  Health Maintenance  Topic Date Due  . Colon Cancer Screening  04/07/2019  . Mammogram  12/16/2019  . Complete foot exam   03/22/2020*  . Tetanus Vaccine  03/22/2020*  .  Hepatitis C: One time screening is recommended by Center for Disease Control  (CDC) for  adults born from [birth year] through 1964.   01/18/2021*  . Hemoglobin A1C  03/17/2020  . Eye exam for diabetics  10/27/2020  . Flu Shot  Completed  . DEXA scan (bone density measurement)  Completed  . COVID-19 Vaccine  Completed  . Pneumonia vaccines  Completed  *Topic was postponed. The date shown is not the original due date.    Immunizations Immunization History  Administered Date(s) Administered  . Fluad Quad(high Dose 65+) 01/13/2020  . Influenza Split 02/29/2012  . Influenza, High Dose Seasonal PF 12/05/2016, 12/29/2018  . Influenza,inj,Quad PF,6+ Mos 01/19/2013, 11/30/2013, 12/09/2014, 11/08/2017  . Influenza-Unspecified 11/15/2015  . PFIZER SARS-COV-2 Vaccination 04/01/2019, 04/27/2019  . Pneumococcal Conjugate-13 08/09/2014  . Pneumococcal Polysaccharide-23 06/09/2012   Keep all routine maintenance appointments.   Next scheduled lab 03/18/20 @ 8:15  Follow up 03/22/20 @ 1:30  Advanced directives: End of life planning; Advance aging; Advanced directives discussed.  Copy of current HCPOA/Living Will requested.    Conditions/risks identified: None new.   Follow up in one year for your annual wellness visit.   Preventive Care 20 Years and Older, Female Preventive care refers  to lifestyle choices and visits with your health care provider that can promote health and wellness. What does preventive care include?  A yearly physical exam. This is also called an annual well check.  Dental exams once or twice a year.  Routine eye exams. Ask your health care provider how often you should have your eyes checked.  Personal lifestyle choices, including:  Daily care of your teeth and gums.  Regular physical activity.  Eating a healthy diet.  Avoiding tobacco and drug use.  Limiting alcohol use.  Practicing safe sex.  Taking low-dose aspirin every day.  Taking vitamin and mineral supplements as recommended by your health care provider. What happens during an annual well check? The services and screenings done by your health care provider during your annual well check will depend on your age, overall health, lifestyle risk factors, and family history of disease. Counseling  Your health care provider may ask you questions about your:  Alcohol use.  Tobacco use.  Drug use.  Emotional well-being.  Home and relationship well-being.  Sexual activity.  Eating habits.  History of falls.  Memory and ability to understand (cognition).  Work and work Statistician.  Reproductive health. Screening  You may have the following tests or measurements:  Height, weight, and BMI.  Blood pressure.  Lipid and cholesterol levels. These may be checked every 5 years, or more frequently if you are over 69 years old.  Skin check.  Lung cancer screening. You may have this screening  every year starting at age 3 if you have a 30-pack-year history of smoking and currently smoke or have quit within the past 15 years.  Fecal occult blood test (FOBT) of the stool. You may have this test every year starting at age 60.  Flexible sigmoidoscopy or colonoscopy. You may have a sigmoidoscopy every 5 years or a colonoscopy every 10 years starting at age 37.  Hepatitis C  blood test.  Hepatitis B blood test.  Sexually transmitted disease (STD) testing.  Diabetes screening. This is done by checking your blood sugar (glucose) after you have not eaten for a while (fasting). You may have this done every 1-3 years.  Bone density scan. This is done to screen for osteoporosis. You may have this done starting at age 61.  Mammogram. This may be done every 1-2 years. Talk to your health care provider about how often you should have regular mammograms. Talk with your health care provider about your test results, treatment options, and if necessary, the need for more tests. Vaccines  Your health care provider may recommend certain vaccines, such as:  Influenza vaccine. This is recommended every year.  Tetanus, diphtheria, and acellular pertussis (Tdap, Td) vaccine. You may need a Td booster every 10 years.  Zoster vaccine. You may need this after age 16.  Pneumococcal 13-valent conjugate (PCV13) vaccine. One dose is recommended after age 88.  Pneumococcal polysaccharide (PPSV23) vaccine. One dose is recommended after age 35. Talk to your health care provider about which screenings and vaccines you need and how often you need them. This information is not intended to replace advice given to you by your health care provider. Make sure you discuss any questions you have with your health care provider. Document Released: 02/25/2015 Document Revised: 10/19/2015 Document Reviewed: 11/30/2014 Elsevier Interactive Patient Education  2017 [place] Prevention in the Home Falls can cause injuries. They can happen to people of all ages. There are many things you can do to make your home safe and to help prevent falls. What can I do on the outside of my home?  Regularly fix the edges of walkways and driveways and fix any cracks.  Remove anything that might make you trip as you walk through a door, such as a raised step or threshold.  Trim any bushes or trees  on the path to your home.  Use bright outdoor lighting.  Clear any walking paths of anything that might make someone trip, such as rocks or tools.  Regularly check to see if handrails are loose or broken. Make sure that both sides of any steps have handrails.  Any raised decks and porches should have guardrails on the edges.  Have any leaves, snow, or ice cleared regularly.  Use sand or salt on walking paths during winter.  Clean up any spills in your garage right away. This includes oil or grease spills. What can I do in the bathroom?  Use night lights.  Install grab bars by the toilet and in the tub and shower. Do not use towel bars as grab bars.  Use non-skid mats or decals in the tub or shower.  If you need to sit down in the shower, use a plastic, non-slip stool.  Keep the floor dry. Clean up any water that spills on the floor as soon as it happens.  Remove soap buildup in the tub or shower regularly.  Attach bath mats securely with double-sided non-slip rug tape.  Do not have throw rugs  and other things on the floor that can make you trip. What can I do in the bedroom?  Use night lights.  Make sure that you have a light by your bed that is easy to reach.  Do not use any sheets or blankets that are too big for your bed. They should not hang down onto the floor.  Have a firm chair that has side arms. You can use this for support while you get dressed.  Do not have throw rugs and other things on the floor that can make you trip. What can I do in the kitchen?  Clean up any spills right away.  Avoid walking on wet floors.  Keep items that you use a lot in easy-to-reach places.  If you need to reach something above you, use a strong step stool that has a grab bar.  Keep electrical cords out of the way.  Do not use floor polish or wax that makes floors slippery. If you must use wax, use non-skid floor wax.  Do not have throw rugs and other things on the floor  that can make you trip. What can I do with my stairs?  Do not leave any items on the stairs.  Make sure that there are handrails on both sides of the stairs and use them. Fix handrails that are broken or loose. Make sure that handrails are as long as the stairways.  Check any carpeting to make sure that it is firmly attached to the stairs. Fix any carpet that is loose or worn.  Avoid having throw rugs at the top or bottom of the stairs. If you do have throw rugs, attach them to the floor with carpet tape.  Make sure that you have a light switch at the top of the stairs and the bottom of the stairs. If you do not have them, ask someone to add them for you. What else can I do to help prevent falls?  Wear shoes that:  Do not have high heels.  Have rubber bottoms.  Are comfortable and fit you well.  Are closed at the toe. Do not wear sandals.  If you use a stepladder:  Make sure that it is fully opened. Do not climb a closed stepladder.  Make sure that both sides of the stepladder are locked into place.  Ask someone to hold it for you, if possible.  Clearly mark and make sure that you can see:  Any grab bars or handrails.  First and last steps.  Where the edge of each step is.  Use tools that help you move around (mobility aids) if they are needed. These include:  Canes.  Walkers.  Scooters.  Crutches.  Turn on the lights when you go into a dark area. Replace any light bulbs as soon as they burn out.  Set up your furniture so you have a clear path. Avoid moving your furniture around.  If any of your floors are uneven, fix them.  If there are any pets around you, be aware of where they are.  Review your medicines with your doctor. Some medicines can make you feel dizzy. This can increase your chance of falling. Ask your doctor what other things that you can do to help prevent falls. This information is not intended to replace advice given to you by your health  care provider. Make sure you discuss any questions you have with your health care provider. Document Released: 11/25/2008 Document Revised: 07/07/2015 Document Reviewed: 03/05/2014 Elsevier  Interactive Patient Education  2017 [employer].

## 2020-01-20 ENCOUNTER — Ambulatory Visit: Payer: Medicare PPO | Admitting: Gastroenterology

## 2020-01-25 ENCOUNTER — Other Ambulatory Visit: Payer: Self-pay | Admitting: Internal Medicine

## 2020-02-16 ENCOUNTER — Other Ambulatory Visit: Payer: Self-pay | Admitting: Internal Medicine

## 2020-02-16 DIAGNOSIS — Z1231 Encounter for screening mammogram for malignant neoplasm of breast: Secondary | ICD-10-CM

## 2020-02-18 ENCOUNTER — Other Ambulatory Visit: Payer: Self-pay

## 2020-02-18 ENCOUNTER — Encounter: Payer: Self-pay | Admitting: Gastroenterology

## 2020-02-18 ENCOUNTER — Ambulatory Visit (INDEPENDENT_AMBULATORY_CARE_PROVIDER_SITE_OTHER): Payer: Medicare PPO | Admitting: Gastroenterology

## 2020-02-18 VITALS — BP 168/68 | HR 75 | Temp 97.3°F | Ht 64.0 in | Wt 186.2 lb

## 2020-02-18 DIAGNOSIS — Z86010 Personal history of colonic polyps: Secondary | ICD-10-CM

## 2020-02-18 NOTE — Progress Notes (Signed)
Gastroenterology Consultation  Referring Provider:     Dale [place], MD Primary Care Physician:  Lori Ferdinand, MD Primary Gastroenterologist:  Dr. Servando Guzman     Reason for Consultation:     Discuss whether she needs a colonoscopy        HPI:   Lori Guzman is a 80 y.o. y/o female referred for consultation & management of to discuss whether she needs a colonoscopy by Dr. Lorin Guzman, Lori Hummer, MD.  This patient comes in today with a history of colon polyps many years ago.  The patient has had 2 colonoscopies since and has not had any further polyps.  The patient had a colonoscopy that showed diverticulosis.  The patient denies any unexplained weight loss fevers chills nausea vomiting black stools or bloody stools.  She also denies any family history of colon cancer or colon polyps.  The patient is also concerned that at her age of 15 whether she needs a colonoscopy or not.  The patient states that her son is a patient of mine and recommended she come see me.  Past Medical History:  Diagnosis Date  . Allergy    reoccuring problems  . GERD (gastroesophageal reflux disease)   . History of colon polyps    adenomatous  . Hypertension     Past Surgical History:  Procedure Laterality Date  . BARTHOLIN GLAND CYST EXCISION    . CHOLECYSTECTOMY    . NASAL POLYP SURGERY  1964  . TONSILECTOMY/ADENOIDECTOMY WITH MYRINGOTOMY  1950    Prior to Admission medications   Medication Sig Start Date End Date Taking? Authorizing Provider  aspirin EC 81 MG tablet Take 81 mg by mouth daily.   Yes [provider]  escitalopram (LEXAPRO) 10 MG tablet TAKE 1 TABLET BY MOUTH EVERY DAY 10/22/19  Yes Lori Childress, MD  mometasone (NASONEX) 50 MCG/ACT nasal spray Place into the nose.   Yes [provider]  rosuvastatin (CRESTOR) 5 MG tablet TAKE 1 TABLET BY MOUTH EVERY DAY 10/22/19  Yes Lori [place], MD  telmisartan (MICARDIS) 80 MG tablet TAKE 1 TABLET BY MOUTH EVERY DAY 01/25/20  Yes  Lori [place], MD  Red Yeast Rice Extract (RED YEAST RICE PO) Take by mouth. Patient not taking: No sig reported    [provider]    Family History  Problem Relation Age of Onset  . Heart disease Mother   . CVA Mother   . Hypertension Mother   . Alzheimer's disease Mother   . Esophageal cancer Father   . Breast cancer Neg Hx   . Colon cancer Neg Hx      Social History   Tobacco Use  . Smoking status: Never Smoker  . Smokeless tobacco: Never Used  Substance Use Topics  . Alcohol use: No    Alcohol/week: 0.0 standard drinks  . Drug use: No    Allergies as of 02/18/2020  . (No Known Allergies)    Review of Systems:    All systems reviewed and negative except where noted in HPI.   Physical Exam:  BP (!) 168/68   Pulse 75   Temp (!) 97.3 F (36.3 C) (Temporal)   Ht 5\' 4"  (1.626 m)   Wt 186 lb 3.2 oz (84.5 kg)   LMP 02/29/1992   BMI 31.96 kg/m  Patient's last menstrual period was 02/29/1992. General:   Alert,  Well-developed, well-nourished, pleasant and cooperative in NAD Head:  Normocephalic and atraumatic. Eyes:  Sclera clear, no icterus.   Conjunctiva  pink. Ears:  Normal auditory acuity. Neurologic:  Alert and oriented x3;  grossly normal neurologically. Skin:  Intact without significant lesions or rashes.  No jaundice. Psych:  Alert and cooperative. Normal mood and affect.  Imaging Studies: No results found.  Assessment and Plan:   Lori Guzman is a 80 y.o. y/o female who comes in today with a history of colon polyps that are remote colonoscopy.  The patient had no polyps in the last 2 colonoscopies.  By most recent guidelines for a negative colonoscopy without any polyps would require a repeat colonoscopy in 10 years.  Her last colonoscopy was 5 years ago.  The patient has been told that having 2 previous colonoscopies both showing no polyps puts her at low risk of having any further polyps at this time.  The patient was given the choice  whether she would like to proceed with a colonoscopy since she is healthy and she agrees that she is not concerned and would not like to proceed with a colonoscopy at this time.  The patient has been explained the plan and agrees with it.    Lori Lame, MD. Marval Regal    Note: This dictation was prepared with Dragon dictation along with smaller phrase technology. Any transcriptional errors that result from this process are unintentional.

## 2020-02-24 ENCOUNTER — Other Ambulatory Visit: Payer: Self-pay

## 2020-02-24 ENCOUNTER — Ambulatory Visit
Admission: RE | Admit: 2020-02-24 | Discharge: 2020-02-24 | Disposition: A | Discharge status: Home / Self Care | Payer: Medicare PPO | Source: Ambulatory Visit | Attending: Internal Medicine | Admitting: Internal Medicine

## 2020-02-24 DIAGNOSIS — Z1231 Encounter for screening mammogram for malignant neoplasm of breast: Secondary | ICD-10-CM | POA: Diagnosis not present

## 2020-03-16 ENCOUNTER — Telehealth: Payer: Self-pay | Admitting: *Deleted

## 2020-03-16 DIAGNOSIS — E1165 Type 2 diabetes mellitus with hyperglycemia: Secondary | ICD-10-CM

## 2020-03-16 DIAGNOSIS — E7800 Pure hypercholesterolemia, unspecified: Secondary | ICD-10-CM

## 2020-03-16 DIAGNOSIS — I10 Essential (primary) hypertension: Secondary | ICD-10-CM

## 2020-03-16 NOTE — Telephone Encounter (Signed)
Please place future orders for lab appt.  

## 2020-03-16 NOTE — Telephone Encounter (Signed)
Orders placed for f/u labs.  

## 2020-03-18 ENCOUNTER — Other Ambulatory Visit: Payer: Self-pay

## 2020-03-18 ENCOUNTER — Other Ambulatory Visit (INDEPENDENT_AMBULATORY_CARE_PROVIDER_SITE_OTHER): Payer: Medicare PPO

## 2020-03-18 DIAGNOSIS — E1165 Type 2 diabetes mellitus with hyperglycemia: Secondary | ICD-10-CM | POA: Diagnosis not present

## 2020-03-18 DIAGNOSIS — E7800 Pure hypercholesterolemia, unspecified: Secondary | ICD-10-CM | POA: Diagnosis not present

## 2020-03-18 DIAGNOSIS — I10 Essential (primary) hypertension: Secondary | ICD-10-CM | POA: Diagnosis not present

## 2020-03-18 LAB — HEPATIC FUNCTION PANEL
ALT: 12 U/L (ref 0–35)
AST: 16 U/L (ref 0–37)
Albumin: 4.2 g/dL (ref 3.5–5.2)
Alkaline Phosphatase: 53 U/L (ref 39–117)
Bilirubin, Direct: 0.3 mg/dL (ref 0.0–0.3)
Total Bilirubin: 1.6 mg/dL — ABNORMAL HIGH (ref 0.2–1.2)
Total Protein: 6.7 g/dL (ref 6.0–8.3)

## 2020-03-18 LAB — CBC WITH DIFFERENTIAL/PLATELET
Basophils Absolute: 0 10*3/uL (ref 0.0–0.1)
Basophils Relative: 0.9 % (ref 0.0–3.0)
Eosinophils Absolute: 0.2 10*3/uL (ref 0.0–0.7)
Eosinophils Relative: 4.3 % (ref 0.0–5.0)
HCT: 38.6 % (ref 36.0–46.0)
Hemoglobin: 13.1 g/dL (ref 12.0–15.0)
Lymphocytes Relative: 31.1 % (ref 12.0–46.0)
Lymphs Abs: 1.5 10*3/uL (ref 0.7–4.0)
MCHC: 34 g/dL (ref 30.0–36.0)
MCV: 91.4 fl (ref 78.0–100.0)
Monocytes Absolute: 0.5 10*3/uL (ref 0.1–1.0)
Monocytes Relative: 10.7 % (ref 3.0–12.0)
Neutro Abs: 2.6 10*3/uL (ref 1.4–7.7)
Neutrophils Relative %: 53 % (ref 43.0–77.0)
Platelets: 220 10*3/uL (ref 150.0–400.0)
RBC: 4.22 Mil/uL (ref 3.87–5.11)
RDW: 13.5 % (ref 11.5–15.5)
WBC: 4.9 10*3/uL (ref 4.0–10.5)

## 2020-03-18 LAB — BASIC METABOLIC PANEL
BUN: 20 mg/dL (ref 6–23)
CO2: 30 mEq/L (ref 19–32)
Calcium: 9.7 mg/dL (ref 8.4–10.5)
Chloride: 105 mEq/L (ref 96–112)
Creatinine, Ser: 0.81 mg/dL (ref 0.40–1.20)
GFR: 69.39 mL/min (ref >60.00)
Glucose, Bld: 110 mg/dL — ABNORMAL HIGH (ref 70–99)
Potassium: 4.1 mEq/L (ref 3.5–5.1)
Sodium: 141 mEq/L (ref 135–145)

## 2020-03-18 LAB — TSH: TSH: 2.86 u[IU]/mL (ref 0.35–4.50)

## 2020-03-18 LAB — LIPID PANEL
Cholesterol: 135 mg/dL (ref 0–200)
HDL: 59.3 mg/dL (ref >39.00)
LDL Cholesterol: 63 mg/dL (ref 0–99)
NonHDL: 75.64
Total CHOL/HDL Ratio: 2
Triglycerides: 64 mg/dL (ref 0.0–149.0)
VLDL: 12.8 mg/dL (ref 0.0–40.0)

## 2020-03-18 LAB — MICROALBUMIN / CREATININE URINE RATIO
Creatinine,U: 128.6 mg/dL
Microalb Creat Ratio: 2.3 mg/g (ref 0.0–30.0)
Microalb, Ur: 3 mg/dL — ABNORMAL HIGH (ref 0.0–1.9)

## 2020-03-18 LAB — HEMOGLOBIN A1C: Hgb A1c MFr Bld: 6.4 % (ref 4.6–6.5)

## 2020-03-22 ENCOUNTER — Encounter: Payer: Self-pay | Admitting: Internal Medicine

## 2020-03-22 ENCOUNTER — Other Ambulatory Visit: Payer: Self-pay

## 2020-03-22 ENCOUNTER — Ambulatory Visit: Payer: Medicare PPO | Admitting: Internal Medicine

## 2020-03-22 DIAGNOSIS — L298 Other pruritus: Secondary | ICD-10-CM | POA: Diagnosis not present

## 2020-03-22 DIAGNOSIS — L570 Actinic keratosis: Secondary | ICD-10-CM | POA: Diagnosis not present

## 2020-03-22 DIAGNOSIS — L821 Other seborrheic keratosis: Secondary | ICD-10-CM | POA: Diagnosis not present

## 2020-03-22 DIAGNOSIS — F439 Reaction to severe stress, unspecified: Secondary | ICD-10-CM | POA: Diagnosis not present

## 2020-03-22 DIAGNOSIS — E1165 Type 2 diabetes mellitus with hyperglycemia: Secondary | ICD-10-CM | POA: Diagnosis not present

## 2020-03-22 DIAGNOSIS — E7800 Pure hypercholesterolemia, unspecified: Secondary | ICD-10-CM | POA: Diagnosis not present

## 2020-03-22 DIAGNOSIS — L2089 Other atopic dermatitis: Secondary | ICD-10-CM | POA: Diagnosis not present

## 2020-03-22 DIAGNOSIS — M79645 Pain in left finger(s): Secondary | ICD-10-CM | POA: Diagnosis not present

## 2020-03-22 DIAGNOSIS — I10 Essential (primary) hypertension: Secondary | ICD-10-CM | POA: Diagnosis not present

## 2020-03-22 MED ORDER — AMLODIPINE BESYLATE 2.5 MG PO TABS: 2.5000 mg | ORAL_TABLET | Freq: Every day | ORAL | 2 refills | Status: DC

## 2020-03-22 NOTE — Progress Notes (Signed)
Patient ID: Lori Guzman, female   DOB: 07/30/1940, 80 y.o.   MRN: [id number]   Subjective:    Patient ID: Lori Guzman, female    DOB: 1940/12/13, 80 y.o.   MRN: [id number]  HPI This visit occurred during the SARS-CoV-2 public health emergency.  Safety protocols were in place, including screening questions prior to the visit, additional usage of staff PPE, and extensive cleaning of exam room while observing appropriate contact time as indicated for disinfecting solutions.  Patient here for a scheduled follow up.  Here to follow up regarding her blood pressure, blood sugar and cholesterol.  She has not been exercising as much.  Tries to stay busy. No chest pain or sob reported.  No abdominal pain or bowel change reported.  Reports left shoulder pain previously.  This has resolved.  Now with pain base of left thumb.  Blood pressure remaining elevated.  Systolic readings 833-825.     Past Medical History:  Diagnosis Date  . Allergy    reoccuring problems  . GERD (gastroesophageal reflux disease)   . History of colon polyps    adenomatous  . Hypertension    Past Surgical History:  Procedure Laterality Date  . BARTHOLIN GLAND CYST EXCISION    . CHOLECYSTECTOMY    . [place]  . TONSILECTOMY/ADENOIDECTOMY WITH MYRINGOTOMY  1950   Family History  Problem Relation Age of Onset  . Heart disease Mother   . CVA Mother   . Hypertension Mother   . Alzheimer's disease Mother   . Esophageal cancer Father   . Breast cancer Neg Hx   . Colon cancer Neg Hx    Social History   Socioeconomic History  . Marital status: Widowed    Spouse name: Not on file  . Number of children: Not on file  . Years of education: Not on file  . Highest education level: Not on file  Occupational History  . Not on file  Tobacco Use  . Smoking status: Never Smoker  . Smokeless tobacco: Never Used  Substance and Sexual Activity  . Alcohol use: No    Alcohol/week: 0.0 standard drinks   . Drug use: No  . Sexual activity: Not on file  Other Topics Concern  . Not on file  Social History Narrative   She is married, has 3 children and is retired.   Social Determinants of Health   Financial Resource Strain: Low Risk   . Difficulty of Paying Living Expenses: Not hard at all  Food Insecurity: No Food Insecurity  . Worried About Charity fundraiser in the Last Year: Never true  . Ran Out of Food in the Last Year: Never true  Transportation Needs: No Transportation Needs  . Lack of Transportation (Medical): No  . Lack of Transportation (Non-Medical): No  Physical Activity: Not on file  Stress: No Stress Concern Present  . Feeling of Stress : Only a little  Social Connections: Moderately Integrated  . Frequency of Communication with Friends and Family: More than three times a week  . Frequency of Social Gatherings with Friends and Family: More than three times a week  . Attends Religious Services: 1 to 4 times per year  . Active Member of Clubs or Organizations: Yes  . Attends Archivist Meetings: Not on file  . Marital Status: Widowed    Outpatient Encounter Medications as of 03/22/2020  Medication Sig  . amLODipine (NORVASC) 2.5 MG tablet Take 1 tablet (2.5  mg total) by mouth daily.  Marland Kitchen aspirin EC 81 MG tablet Take 81 mg by mouth daily.  Marland Kitchen escitalopram (LEXAPRO) 10 MG tablet TAKE 1 TABLET BY MOUTH EVERY DAY  . mometasone (NASONEX) 50 MCG/ACT nasal spray Place into the nose.  . rosuvastatin (CRESTOR) 5 MG tablet TAKE 1 TABLET BY MOUTH EVERY DAY  . telmisartan (MICARDIS) 80 MG tablet TAKE 1 TABLET BY MOUTH EVERY DAY  . [DISCONTINUED] Red Yeast Rice Extract (RED YEAST RICE PO) Take by mouth. (Patient not taking: No sig reported)   No facility-administered encounter medications on file as of 03/22/2020.    Review of Systems  Constitutional: Negative for appetite change and unexpected weight change.  HENT: Negative for congestion and sinus pressure.    Respiratory: Negative for cough, chest tightness and shortness of breath.   Cardiovascular: Negative for chest pain, palpitations and leg swelling.  Gastrointestinal: Negative for abdominal pain, diarrhea and nausea.  Genitourinary: Negative for difficulty urinating and dysuria.  Musculoskeletal: Negative for myalgias.       Left shoulder pain and pain base of left thumb.    Skin: Negative for color change and rash.  Neurological: Negative for dizziness, light-headedness and headaches.  Psychiatric/Behavioral: Negative for agitation and dysphoric mood.       Objective:    Physical Exam Vitals reviewed.  Constitutional:      General: She is not in acute distress.    Appearance: Normal appearance.  HENT:     Head: Normocephalic and atraumatic.     Right Ear: External ear normal.     Left Ear: External ear normal.     Mouth/Throat:     Mouth: Oropharynx is clear and moist.  Eyes:     General: No scleral icterus.       Right eye: No discharge.        Left eye: No discharge.     Conjunctiva/sclera: Conjunctivae normal.  Neck:     Thyroid: No thyromegaly.  Cardiovascular:     Rate and Rhythm: Normal rate and regular rhythm.  Pulmonary:     Effort: No respiratory distress.     Breath sounds: Normal breath sounds. No wheezing.  Abdominal:     General: Bowel sounds are normal.     Palpations: Abdomen is soft.     Tenderness: There is no abdominal tenderness.  Musculoskeletal:        General: No swelling, tenderness or edema.     Cervical back: Neck supple. No tenderness.  Lymphadenopathy:     Cervical: No cervical adenopathy.  Skin:    Findings: No erythema or rash.  Neurological:     Mental Status: She is alert.  Psychiatric:        Mood and Affect: Mood normal.        Behavior: Behavior normal.     BP (!) 148/84   Pulse 74   Temp 98.1 F (36.7 C) (Oral)   Resp 16   Ht 5' 4"  (1.626 m)   Wt 187 lb (84.8 kg)   LMP 02/29/1992   SpO2 97%   BMI 32.10 kg/m  Wt  Readings from Last 3 Encounters:  03/22/20 187 lb (84.8 kg)  02/18/20 186 lb 3.2 oz (84.5 kg)  01/19/20 179 lb (81.2 kg)     Lab Results  Component Value Date   WBC 4.9 03/18/2020   HGB 13.1 03/18/2020   HCT 38.6 03/18/2020   PLT 220.0 03/18/2020   GLUCOSE 110 (H) 03/18/2020   CHOL 135  03/18/2020   TRIG 64.0 03/18/2020   HDL 59.30 03/18/2020   LDLCALC 63 03/18/2020   ALT 12 03/18/2020   AST 16 03/18/2020   NA 141 03/18/2020   K 4.1 03/18/2020   CL 105 03/18/2020   CREATININE 0.81 03/18/2020   BUN 20 03/18/2020   CO2 30 03/18/2020   TSH 2.86 03/18/2020   HGBA1C 6.4 03/18/2020   MICROALBUR 3.0 (H) 03/18/2020    MM 3D SCREEN BREAST BILATERAL  Result Date: 02/25/2020 CLINICAL DATA:  Screening. EXAM: DIGITAL SCREENING BILATERAL MAMMOGRAM WITH TOMO AND CAD COMPARISON:  Previous exam(s). ACR Breast Density Category b: There are scattered areas of fibroglandular density. FINDINGS: There are no findings suspicious for malignancy. Images were processed with CAD. IMPRESSION: No mammographic evidence of malignancy. A result letter of this screening mammogram will be mailed directly to the patient. RECOMMENDATION: Screening mammogram in one year. (Code:SM-B-01Y) BI-RADS CATEGORY  1: Negative. Electronically Signed   By: Lovey Newcomer M.D.   On: 02/25/2020 09:00       Assessment & Plan:   Problem List Items Addressed This Visit    Diabetes ([place])    Low carb diet and exercise.  Follow met b and a1c.       Hypercholesterolemia    On crestor.  Low cholesterol diet and exercise.  Follow lipid panel and liver function tests.        Relevant Medications   amLODipine (NORVASC) 2.5 MG tablet   Hypertension    Blood pressure elevated as outlined.  On micardis.  Add amlodipine 2.48m q day.  Follow pressures.  Follow metabolic panel.       Relevant Medications   amLODipine (NORVASC) 2.5 MG tablet   Stress    Increased stress.  Discussed with her today.  On lexapro. Overall appears to be  handling things relatively well.  Follow.        Thumb pain, left    Pain base of left thumb.  Discussed thumb spica splint.  Notify me if desires referral.            CEinar Pheasant MD

## 2020-03-22 NOTE — Patient Instructions (Signed)
Thumb spica splint

## 2020-03-27 ENCOUNTER — Encounter: Payer: Self-pay | Admitting: Internal Medicine

## 2020-03-27 DIAGNOSIS — M79645 Pain in left finger(s): Secondary | ICD-10-CM | POA: Insufficient documentation

## 2020-03-27 NOTE — Assessment & Plan Note (Signed)
Increased stress.  Discussed with her today.  On lexapro. Overall appears to be handling things relatively well.  Follow.

## 2020-03-27 NOTE — Assessment & Plan Note (Signed)
Pain base of left thumb.  Discussed thumb spica splint.  Notify me if desires referral.

## 2020-03-27 NOTE — Assessment & Plan Note (Signed)
Low carb diet and exercise.  Follow met b and a1c.  

## 2020-03-27 NOTE — Assessment & Plan Note (Addendum)
Blood pressure elevated as outlined.  On micardis.  Add amlodipine 2.5mg  q day.  Follow pressures.  Follow metabolic panel.

## 2020-03-27 NOTE — Assessment & Plan Note (Signed)
On crestor.  Low cholesterol diet and exercise.  Follow lipid panel and liver function tests.   

## 2020-04-19 ENCOUNTER — Other Ambulatory Visit: Payer: Self-pay

## 2020-04-19 ENCOUNTER — Ambulatory Visit: Payer: Medicare PPO | Admitting: Internal Medicine

## 2020-04-19 ENCOUNTER — Encounter: Payer: Self-pay | Admitting: Internal Medicine

## 2020-04-19 DIAGNOSIS — E1165 Type 2 diabetes mellitus with hyperglycemia: Secondary | ICD-10-CM | POA: Diagnosis not present

## 2020-04-19 DIAGNOSIS — F439 Reaction to severe stress, unspecified: Secondary | ICD-10-CM | POA: Diagnosis not present

## 2020-04-19 DIAGNOSIS — M79645 Pain in left finger(s): Secondary | ICD-10-CM | POA: Diagnosis not present

## 2020-04-19 DIAGNOSIS — E7800 Pure hypercholesterolemia, unspecified: Secondary | ICD-10-CM | POA: Diagnosis not present

## 2020-04-19 DIAGNOSIS — I10 Essential (primary) hypertension: Secondary | ICD-10-CM | POA: Diagnosis not present

## 2020-04-19 LAB — HM DIABETES FOOT EXAM

## 2020-04-19 NOTE — Progress Notes (Signed)
Patient ID: Lori Guzman, female   DOB: 1940/06/05, 80 y.o.   MRN: [id number]   Subjective:    Patient ID: Lori Guzman, female    DOB: November 22, 1940, 80 y.o.   MRN: [id number]  HPI This visit occurred during the SARS-CoV-2 public health emergency.  Safety protocols were in place, including screening questions prior to the visit, additional usage of staff PPE, and extensive cleaning of exam room while observing appropriate contact time as indicated for disinfecting solutions.  Patient here for a scheduled follow up.  Here to follow up regarding her blood pressure.  Was started on amlodipine last visit.  Blood pressures at home averaging 120s/70s.  Tolerating.  No chest pain or sob reported.  No abdominal pain or bowel change reported.  Up to date with eye checks.  Is followed [facility].  Left trigger - thumb.  Discussed referral.  Plans to see Dr Bridgett Larsson.  States he worked on her back and helped.  Handling stress.  Overall feels good.     Past Medical History:  Diagnosis Date  . Allergy    reoccuring problems  . GERD (gastroesophageal reflux disease)   . History of colon polyps    adenomatous  . Hypertension    Past Surgical History:  Procedure Laterality Date  . BARTHOLIN GLAND CYST EXCISION    . CHOLECYSTECTOMY    . [place]  . TONSILECTOMY/ADENOIDECTOMY WITH MYRINGOTOMY  1950   Family History  Problem Relation Age of Onset  . Heart disease Mother   . CVA Mother   . Hypertension Mother   . Alzheimer's disease Mother   . Esophageal cancer Father   . Breast cancer Neg Hx   . Colon cancer Neg Hx    Social History   Socioeconomic History  . Marital status: Widowed    Spouse name: Not on file  . Number of children: Not on file  . Years of education: Not on file  . Highest education level: Not on file  Occupational History  . Not on file  Tobacco Use  . Smoking status: Never Smoker  . Smokeless tobacco: Never Used  Substance and Sexual  Activity  . Alcohol use: No    Alcohol/week: 0.0 standard drinks  . Drug use: No  . Sexual activity: Not on file  Other Topics Concern  . Not on file  Social History Narrative   She is married, has 3 children and is retired.   Social Determinants of Health   Financial Resource Strain: Low Risk   . Difficulty of Paying Living Expenses: Not hard at all  Food Insecurity: No Food Insecurity  . Worried About Charity fundraiser in the Last Year: Never true  . Ran Out of Food in the Last Year: Never true  Transportation Needs: No Transportation Needs  . Lack of Transportation (Medical): No  . Lack of Transportation (Non-Medical): No  Physical Activity: Not on file  Stress: No Stress Concern Present  . Feeling of Stress : Only a little  Social Connections: Moderately Integrated  . Frequency of Communication with Friends and Family: More than three times a week  . Frequency of Social Gatherings with Friends and Family: More than three times a week  . Attends Religious Services: 1 to 4 times per year  . Active Member of Clubs or Organizations: Yes  . Attends Archivist Meetings: Not on file  . Marital Status: Widowed    Outpatient Encounter Medications  as of 04/19/2020  Medication Sig  . amLODipine (NORVASC) 2.5 MG tablet Take 1 tablet (2.5 mg total) by mouth daily.  Marland Kitchen aspirin EC 81 MG tablet Take 81 mg by mouth daily.  Marland Kitchen escitalopram (LEXAPRO) 10 MG tablet TAKE 1 TABLET BY MOUTH EVERY DAY  . mometasone (NASONEX) 50 MCG/ACT nasal spray Place into the nose.  . rosuvastatin (CRESTOR) 5 MG tablet TAKE 1 TABLET BY MOUTH EVERY DAY  . telmisartan (MICARDIS) 80 MG tablet TAKE 1 TABLET BY MOUTH EVERY DAY   No facility-administered encounter medications on file as of 04/19/2020.    Review of Systems  Constitutional: Negative for appetite change and unexpected weight change.  HENT: Negative for congestion and sinus pressure.   Respiratory: Negative for cough, chest tightness and  shortness of breath.   Cardiovascular: Negative for chest pain, palpitations and leg swelling.  Gastrointestinal: Negative for abdominal pain, diarrhea, nausea and vomiting.  Genitourinary: Negative for difficulty urinating and dysuria.  Musculoskeletal: Negative for joint swelling and myalgias.  Skin: Negative for color change and rash.  Neurological: Negative for dizziness, light-headedness and headaches.  Psychiatric/Behavioral: Negative for agitation and dysphoric mood.       Objective:    Physical Exam Vitals reviewed.  Constitutional:      General: She is not in acute distress.    Appearance: Normal appearance.  HENT:     Head: Normocephalic and atraumatic.     Right Ear: External ear normal.     Left Ear: External ear normal.  Eyes:     General: No scleral icterus.       Right eye: No discharge.        Left eye: No discharge.     Conjunctiva/sclera: Conjunctivae normal.  Neck:     Thyroid: No thyromegaly.  Cardiovascular:     Rate and Rhythm: Normal rate and regular rhythm.  Pulmonary:     Effort: No respiratory distress.     Breath sounds: Normal breath sounds. No wheezing.  Abdominal:     General: Bowel sounds are normal.     Palpations: Abdomen is soft.     Tenderness: There is no abdominal tenderness.  Musculoskeletal:        General: No swelling or tenderness.     Cervical back: Neck supple. No tenderness.  Lymphadenopathy:     Cervical: No cervical adenopathy.  Skin:    Findings: No erythema or rash.  Neurological:     Mental Status: She is alert.  Psychiatric:        Mood and Affect: Mood normal.        Behavior: Behavior normal.     BP (!) 158/70   Pulse 70   Temp 97.7 F (36.5 C) (Oral)   Resp 16   Ht 5' 4" (1.626 m)   Wt 186 lb 9.6 oz (84.6 kg)   LMP 02/29/1992   SpO2 98%   BMI 32.03 kg/m  Wt Readings from Last 3 Encounters:  04/19/20 186 lb 9.6 oz (84.6 kg)  03/22/20 187 lb (84.8 kg)  02/18/20 186 lb 3.2 oz (84.5 kg)     Lab  Results  Component Value Date   WBC 4.9 03/18/2020   HGB 13.1 03/18/2020   HCT 38.6 03/18/2020   PLT 220.0 03/18/2020   GLUCOSE 110 (H) 03/18/2020   CHOL 135 03/18/2020   TRIG 64.0 03/18/2020   HDL 59.30 03/18/2020   LDLCALC 63 03/18/2020   ALT 12 03/18/2020   AST 16 03/18/2020  NA 141 03/18/2020   K 4.1 03/18/2020   CL 105 03/18/2020   CREATININE 0.81 03/18/2020   BUN 20 03/18/2020   CO2 30 03/18/2020   TSH 2.86 03/18/2020   HGBA1C 6.4 03/18/2020   MICROALBUR 3.0 (H) 03/18/2020    MM 3D SCREEN BREAST BILATERAL  Result Date: 02/25/2020 CLINICAL DATA:  Screening. EXAM: DIGITAL SCREENING BILATERAL MAMMOGRAM WITH TOMO AND CAD COMPARISON:  Previous exam(s). ACR Breast Density Category b: There are scattered areas of fibroglandular density. FINDINGS: There are no findings suspicious for malignancy. Images were processed with CAD. IMPRESSION: No mammographic evidence of malignancy. A result letter of this screening mammogram will be mailed directly to the patient. RECOMMENDATION: Screening mammogram in one year. (Code:SM-B-01Y) BI-RADS CATEGORY  1: Negative. Electronically Signed   By: Lovey Newcomer M.D.   On: 02/25/2020 09:00       Assessment & Plan:   Problem List Items Addressed This Visit    Diabetes ([place])    Low carb diet and exercise.  Follow met b and a1c.       Hypercholesterolemia    Continue crestor.  Low cholesterol diet and exercise.  Follow lipid panel and liver function tests.       Hypertension    Blood pressure as outlined.  Recheck improved.  Have her to continue to spot check her pressure.  Call in readings.  Appears to be well controlled at home.  Follow pressures.  Follow metabolic panel.       Stress    Continue lexapro.  Doing well.  Follow.  Has good support.       Thumb pain, left    Continued pain - trigger finger.  Plans to f/u with Dr Bridgett Larsson - acupuncture.  Follow.  Will notify me if desires referral to ortho.           Einar Pheasant, MD

## 2020-04-25 ENCOUNTER — Encounter: Payer: Self-pay | Admitting: Internal Medicine

## 2020-04-25 NOTE — Assessment & Plan Note (Signed)
Continue lexapro.  Doing well.  Follow.  Has good support.

## 2020-04-25 NOTE — Assessment & Plan Note (Signed)
Low carb diet and exercise.  Follow met b and a1c.  

## 2020-04-25 NOTE — Assessment & Plan Note (Signed)
Blood pressure as outlined.  Recheck improved.  Have her to continue to spot check her pressure.  Call in readings.  Appears to be well controlled at home.  Follow pressures.  Follow metabolic panel.

## 2020-04-25 NOTE — Assessment & Plan Note (Signed)
Continue crestor.  Low cholesterol diet and exercise. Follow lipid panel and liver function tests.   

## 2020-04-25 NOTE — Assessment & Plan Note (Signed)
Continued pain - trigger finger.  Plans to f/u with Dr Bridgett Larsson - acupuncture.  Follow.  Will notify me if desires referral to ortho.

## 2020-04-26 DIAGNOSIS — H353132 Nonexudative age-related macular degeneration, bilateral, intermediate dry stage: Secondary | ICD-10-CM | POA: Diagnosis not present

## 2020-06-08 DIAGNOSIS — L821 Other seborrheic keratosis: Secondary | ICD-10-CM | POA: Diagnosis not present

## 2020-06-08 DIAGNOSIS — L578 Other skin changes due to chronic exposure to nonionizing radiation: Secondary | ICD-10-CM | POA: Diagnosis not present

## 2020-06-08 DIAGNOSIS — L298 Other pruritus: Secondary | ICD-10-CM | POA: Diagnosis not present

## 2020-06-08 DIAGNOSIS — Z872 Personal history of diseases of the skin and subcutaneous tissue: Secondary | ICD-10-CM | POA: Diagnosis not present

## 2020-06-16 ENCOUNTER — Other Ambulatory Visit: Payer: Self-pay | Admitting: Internal Medicine

## 2020-07-21 ENCOUNTER — Ambulatory Visit: Payer: Medicare PPO | Admitting: Internal Medicine

## 2020-07-29 ENCOUNTER — Other Ambulatory Visit: Payer: Self-pay | Admitting: Internal Medicine

## 2020-08-09 ENCOUNTER — Telehealth (INDEPENDENT_AMBULATORY_CARE_PROVIDER_SITE_OTHER): Payer: Medicare PPO | Admitting: Internal Medicine

## 2020-08-09 DIAGNOSIS — E7800 Pure hypercholesterolemia, unspecified: Secondary | ICD-10-CM

## 2020-08-09 DIAGNOSIS — Z86010 Personal history of colonic polyps: Secondary | ICD-10-CM

## 2020-08-09 DIAGNOSIS — F439 Reaction to severe stress, unspecified: Secondary | ICD-10-CM | POA: Diagnosis not present

## 2020-08-09 DIAGNOSIS — E1165 Type 2 diabetes mellitus with hyperglycemia: Secondary | ICD-10-CM

## 2020-08-09 DIAGNOSIS — R0981 Nasal congestion: Secondary | ICD-10-CM | POA: Diagnosis not present

## 2020-08-09 DIAGNOSIS — I10 Essential (primary) hypertension: Secondary | ICD-10-CM | POA: Diagnosis not present

## 2020-08-09 NOTE — Progress Notes (Signed)
Patient ID: Lori Guzman, female   DOB: [birth year], 80 y.o.   MRN: [id number]   Virtual Visit via video Note  This visit type was conducted due to national recommendations for restrictions regarding the COVID-19 pandemic (e.g. social distancing).  This format is felt to be most appropriate for this patient at this time.  All issues noted in this document were discussed and addressed.  No physical exam was performed (except for noted visual exam findings with Video Visits).   I connected with Lori Guzman by a video enabled telemedicine application and verified that I am speaking with the correct person using two identifiers. Location patient: home Location provider: work  Persons participating in the virtual visit: patient, provider  The limitations, risks, security and privacy concerns of performing an evaluation and management service by video and the availability of in person appointments have been discussed.  It has also been.discussed with the patient that there may be a patient responsible charge related to this service. The patient expressed understanding and agreed to proceed.   Reason for visit: scheduled follow up.   HPI: Follow up regarding her blood pressure.  She has been under increased stress recently.  Caring for her cousin.  Blood pressure increased during this time.  She has now removed herself from that situation.  Stress is better.  Blood pressure starting to come down.  She states now is mostly averaging in the 130s to 140s /60s.  No chest pain tightness or shortness of breath.  No nausea or vomiting currently. She stopped the amlodipine when the stress improved.  Felt blood pressure better, so did not need.  Blood pressures averaging:  137-140/60s.  Has also been off cholesterol medication.  Does report end of April - scratchy throat, laryngitis.  No fever.  Ears popping - pressure.  Sinus congestioin.  States still having sinus pressure.  Increased drainage.  Yellow  mucus production.  No chest congestion or sob.  No chest pain.  Feels the increased drainage contributing to cough.  Ate out Friday.  Noticed some vomiting and diarrhea.  Resolved now.  Metamucil.  Eating.     ROS: See pertinent positives and negatives per HPI.  Past Medical History:  Diagnosis Date   Allergy    reoccuring problems   GERD (gastroesophageal reflux disease)    History of colon polyps    adenomatous   Hypertension     Past Surgical History:  Procedure Laterality Date   BARTHOLIN GLAND CYST EXCISION     CHOLECYSTECTOMY     NASAL POLYP SURGERY  1964   TONSILECTOMY/ADENOIDECTOMY WITH MYRINGOTOMY  1950    Family History  Problem Relation Age of Onset   Heart disease Mother    CVA Mother    Hypertension Mother    Alzheimer's disease Mother    Esophageal cancer Father    Breast cancer Neg Hx    Colon cancer Neg Hx     SOCIAL HX: reviewed.    Current Outpatient Medications:    aspirin EC 81 MG tablet, Take 81 mg by mouth daily., Disp: , Rfl:    escitalopram (LEXAPRO) 10 MG tablet, TAKE 1 TABLET BY MOUTH EVERY DAY, Disp: 90 tablet, Rfl: 1   mometasone (NASONEX) 50 MCG/ACT nasal spray, Place into the nose., Disp: , Rfl:    telmisartan (MICARDIS) 80 MG tablet, TAKE 1 TABLET BY MOUTH EVERY DAY, Disp: 90 tablet, Rfl: 1   amLODipine (NORVASC) 2.5 MG tablet, Take 1 tablet (2.5 mg total) by  mouth daily., Disp: 30 tablet, Rfl: 2   rosuvastatin (CRESTOR) 5 MG tablet, Take 1 tablet (5 mg total) by mouth daily., Disp: 90 tablet, Rfl: 1  EXAM:  VITALS per patient if applicable: 161-096/04V.   GENERAL: alert, oriented, appears well and in no acute distress  HEENT: atraumatic, conjunttiva clear, no obvious abnormalities on inspection of external nose and ears  NECK: normal movements of the head and neck  LUNGS: on inspection no signs of respiratory distress, breathing rate appears normal, no obvious gross SOB, gasping or wheezing  CV: no obvious  cyanosis  PSYCH/NEURO: pleasant and cooperative, no obvious depression or anxiety, speech and thought processing grossly intact  ASSESSMENT AND PLAN:  Discussed the following assessment and plan:  Problem List Items Addressed This Visit     Diabetes ([place])    Low carb diet and exercise given elevated blood sugars.  On no medication.   Follow met b and a1c.         Relevant Medications   rosuvastatin (CRESTOR) 5 MG tablet   History of colonic polyps    Last colonoscopy 2016.  Recommended f/u in 5 years.  Per pt, per GI no f/u colonoscopy warranted.         Hypercholesterolemia    Restart crestor.  Low cholesterol diet and exercise.  Follow lipid panel and liver function tests.         Relevant Medications   amLODipine (NORVASC) 2.5 MG tablet   rosuvastatin (CRESTOR) 5 MG tablet   Hypertension    Stress is better.  Blood pressure still elevated above goal.  Restart amlodipine 2.17m q day.  Follow pressures.  Follow metabolic panel.        Relevant Medications   amLODipine (NORVASC) 2.5 MG tablet   rosuvastatin (CRESTOR) 5 MG tablet   Sinus congestion    Increased congestion and drainage as outlined.  Mucinex/robitussin as directed.  Steroid nasal spray and saline nasal spray as directed.  Follow.  Notify me if persistent.        Stress    Increased stress as outlined.  Discussed.  Better now.  Has good family support.  Continue lexapro.          Return in about 6 weeks (around 09/20/2020) for follow up appt (330m).   I discussed the assessment and treatment plan with the patient. The patient was provided an opportunity to ask questions and all were answered. The patient agreed with the plan and demonstrated an understanding of the instructions.   The patient was advised to call back or seek an in-person evaluation if the symptoms worsen or if the condition fails to improve as anticipated.   ChEinar PheasantMD

## 2020-08-15 ENCOUNTER — Encounter: Payer: Self-pay | Admitting: Internal Medicine

## 2020-08-15 DIAGNOSIS — R0981 Nasal congestion: Secondary | ICD-10-CM | POA: Insufficient documentation

## 2020-08-15 MED ORDER — AMLODIPINE BESYLATE 2.5 MG PO TABS: 2.5000 mg | ORAL_TABLET | Freq: Every day | ORAL | 2 refills | Status: DC

## 2020-08-15 MED ORDER — ROSUVASTATIN CALCIUM 5 MG PO TABS: 5.0000 mg | ORAL_TABLET | Freq: Every day | ORAL | 1 refills | Status: DC

## 2020-08-15 NOTE — Assessment & Plan Note (Signed)
Restart crestor.  Low cholesterol diet and exercise.  Follow lipid panel and liver function tests.

## 2020-08-15 NOTE — Assessment & Plan Note (Signed)
Last colonoscopy 2016.  Recommended f/u in 5 years.  Per pt, per GI no f/u colonoscopy warranted.   

## 2020-08-15 NOTE — Assessment & Plan Note (Signed)
Stress is better.  Blood pressure still elevated above goal.  Restart amlodipine 2.5mg  q day.  Follow pressures.  Follow metabolic panel.

## 2020-08-15 NOTE — Assessment & Plan Note (Addendum)
Low carb diet and exercise given elevated blood sugars.  On no medication.   Follow met b and a1c.   

## 2020-08-15 NOTE — Assessment & Plan Note (Signed)
Increased stress as outlined.  Discussed.  Better now.  Has good family support.  Continue lexapro.

## 2020-08-15 NOTE — Assessment & Plan Note (Signed)
Increased congestion and drainage as outlined.  Mucinex/robitussin as directed.  Steroid nasal spray and saline nasal spray as directed.  Follow.  Notify me if persistent.

## 2020-09-05 ENCOUNTER — Other Ambulatory Visit: Payer: Self-pay | Admitting: Internal Medicine

## 2020-09-05 ENCOUNTER — Telehealth: Payer: Self-pay | Admitting: *Deleted

## 2020-09-05 DIAGNOSIS — E1165 Type 2 diabetes mellitus with hyperglycemia: Secondary | ICD-10-CM

## 2020-09-05 DIAGNOSIS — I10 Essential (primary) hypertension: Secondary | ICD-10-CM

## 2020-09-05 DIAGNOSIS — E7800 Pure hypercholesterolemia, unspecified: Secondary | ICD-10-CM

## 2020-09-05 NOTE — Telephone Encounter (Signed)
Orders placed for labs

## 2020-09-05 NOTE — Progress Notes (Signed)
Order placed for f/u labs.  

## 2020-09-05 NOTE — Telephone Encounter (Signed)
Please place future orders for lab appt.  

## 2020-09-06 ENCOUNTER — Other Ambulatory Visit: Payer: Self-pay

## 2020-09-06 ENCOUNTER — Other Ambulatory Visit (INDEPENDENT_AMBULATORY_CARE_PROVIDER_SITE_OTHER): Payer: Medicare PPO

## 2020-09-06 DIAGNOSIS — E7800 Pure hypercholesterolemia, unspecified: Secondary | ICD-10-CM

## 2020-09-06 DIAGNOSIS — E1165 Type 2 diabetes mellitus with hyperglycemia: Secondary | ICD-10-CM

## 2020-09-06 LAB — HEPATIC FUNCTION PANEL
ALT: 11 U/L (ref 0–35)
AST: 14 U/L (ref 0–37)
Albumin: 4.1 g/dL (ref 3.5–5.2)
Alkaline Phosphatase: 50 U/L (ref 39–117)
Bilirubin, Direct: 0.2 mg/dL (ref 0.0–0.3)
Total Bilirubin: 1.2 mg/dL (ref 0.2–1.2)
Total Protein: 6.7 g/dL (ref 6.0–8.3)

## 2020-09-06 LAB — LIPID PANEL
Cholesterol: 127 mg/dL (ref 0–200)
HDL: 55.5 mg/dL (ref >39.00)
LDL Cholesterol: 58 mg/dL (ref 0–99)
NonHDL: 71.73
Total CHOL/HDL Ratio: 2
Triglycerides: 67 mg/dL (ref 0.0–149.0)
VLDL: 13.4 mg/dL (ref 0.0–40.0)

## 2020-09-06 LAB — BASIC METABOLIC PANEL
BUN: 19 mg/dL (ref 6–23)
CO2: 31 mEq/L (ref 19–32)
Calcium: 9.4 mg/dL (ref 8.4–10.5)
Chloride: 106 mEq/L (ref 96–112)
Creatinine, Ser: 0.74 mg/dL (ref 0.40–1.20)
GFR: 77.08 mL/min (ref >60.00)
Glucose, Bld: 110 mg/dL — ABNORMAL HIGH (ref 70–99)
Potassium: 4.3 mEq/L (ref 3.5–5.1)
Sodium: 142 mEq/L (ref 135–145)

## 2020-09-06 LAB — HEMOGLOBIN A1C: Hgb A1c MFr Bld: 6.6 % — ABNORMAL HIGH (ref 4.6–6.5)

## 2020-09-27 ENCOUNTER — Ambulatory Visit: Payer: Medicare PPO | Admitting: Internal Medicine

## 2020-09-27 ENCOUNTER — Ambulatory Visit (INDEPENDENT_AMBULATORY_CARE_PROVIDER_SITE_OTHER): Payer: Medicare PPO

## 2020-09-27 ENCOUNTER — Other Ambulatory Visit: Payer: Self-pay

## 2020-09-27 DIAGNOSIS — Z86010 Personal history of colonic polyps: Secondary | ICD-10-CM | POA: Diagnosis not present

## 2020-09-27 DIAGNOSIS — I10 Essential (primary) hypertension: Secondary | ICD-10-CM

## 2020-09-27 DIAGNOSIS — E7800 Pure hypercholesterolemia, unspecified: Secondary | ICD-10-CM

## 2020-09-27 DIAGNOSIS — E1165 Type 2 diabetes mellitus with hyperglycemia: Secondary | ICD-10-CM

## 2020-09-27 DIAGNOSIS — M25561 Pain in right knee: Secondary | ICD-10-CM

## 2020-09-27 DIAGNOSIS — F439 Reaction to severe stress, unspecified: Secondary | ICD-10-CM

## 2020-09-27 MED ORDER — AMLODIPINE BESYLATE 2.5 MG PO TABS: 2.5000 mg | ORAL_TABLET | Freq: Every day | ORAL | 1 refills | Status: DC

## 2020-09-27 NOTE — Progress Notes (Signed)
Patient ID: Lori Guzman, female   DOB: Jun 23, 1940, 80 y.o.   MRN: [id number]   Subjective:    Patient ID: Lori Guzman, female    DOB: Apr 12, 1940, 80 y.o.   MRN: [id number]  HPI This visit occurred during the SARS-CoV-2 public health emergency.  Safety protocols were in place, including screening questions prior to the visit, additional usage of staff PPE, and extensive cleaning of exam room while observing appropriate contact time as indicated for disinfecting solutions.   Patient here for a scheduled follow up.  Here to follow up regarding her blood pressure and cholesterol.  She is off cholesterol medication.  Taking red yeast rice.  Has been having increased right knee pain.  Was questioning if crestor aggravating.  Seeing Dr Bridgett Larsson - acupuncture.  Persistent pain/swelling - right knee.  Taking micardis.  Off amlodipine.  States was recalled.  Blood pressure elevated.  No nausea or vomiting reported.  No chest pain or sob reported.  No abdominal pain or bowel change reported.    Past Medical History:  Diagnosis Date   Allergy    reoccuring problems   GERD (gastroesophageal reflux disease)    History of colon polyps    adenomatous   Hypertension    Past Surgical History:  Procedure Laterality Date   BARTHOLIN GLAND CYST EXCISION     CHOLECYSTECTOMY     NASAL POLYP SURGERY  1964   TONSILECTOMY/ADENOIDECTOMY WITH MYRINGOTOMY  1950   Family History  Problem Relation Age of Onset   Heart disease Mother    CVA Mother    Hypertension Mother    Alzheimer's disease Mother    Esophageal cancer Father    Breast cancer Neg Hx    Colon cancer Neg Hx    Social History   Socioeconomic History   Marital status: Widowed    Spouse name: Not on file   Number of children: Not on file   Years of education: Not on file   Highest education level: Not on file  Occupational History   Not on file  Tobacco Use   Smoking status: Never   Smokeless tobacco: Never  Substance and Sexual  Activity   Alcohol use: No    Alcohol/week: 0.0 standard drinks   Drug use: No   Sexual activity: Not on file  Other Topics Concern   Not on file  Social History Narrative   She is married, has 3 children and is retired.   Social Determinants of Health   Financial Resource Strain: Low Risk    Difficulty of Paying Living Expenses: Not hard at all  Food Insecurity: No Food Insecurity   Worried About Charity fundraiser in the Last Year: Never true   [place] in the Last Year: Never true  Transportation Needs: No Transportation Needs   Lack of Transportation (Medical): No   Lack of Transportation (Non-Medical): No  Physical Activity: Not on file  Stress: No Stress Concern Present   Feeling of Stress : Only a little  Social Connections: Moderately Integrated   Frequency of Communication with Friends and Family: More than three times a week   Frequency of Social Gatherings with Friends and Family: More than three times a week   Attends Religious Services: 1 to 4 times per year   Active Member of [employer] or Organizations: Yes   Attends Archivist Meetings: Not on file   Marital Status: Widowed    Review of Systems  Constitutional:  Negative for fatigue and unexpected weight change.  HENT:  Negative for sinus pressure.   Respiratory:  Negative for cough, chest tightness and shortness of breath.   Cardiovascular:  Negative for chest pain, palpitations and leg swelling.  Gastrointestinal:  Negative for abdominal pain, diarrhea, nausea and vomiting.  Genitourinary:  Negative for difficulty urinating and dysuria.  Musculoskeletal:  Negative for myalgias.       Right knee pain as outlined.   Skin:  Negative for color change and rash.  Neurological:  Negative for dizziness, light-headedness and headaches.  Psychiatric/Behavioral:  Negative for agitation and dysphoric mood.       Objective:    Physical Exam Vitals reviewed.  Constitutional:      General: She is not  in acute distress.    Appearance: Normal appearance.  HENT:     Head: Normocephalic and atraumatic.     Right Ear: External ear normal.     Left Ear: External ear normal.  Eyes:     General: No scleral icterus.       Right eye: No discharge.        Left eye: No discharge.     Conjunctiva/sclera: Conjunctivae normal.  Neck:     Thyroid: No thyromegaly.  Cardiovascular:     Rate and Rhythm: Normal rate and regular rhythm.  Pulmonary:     Effort: No respiratory distress.     Breath sounds: Normal breath sounds. No wheezing.  Abdominal:     General: Bowel sounds are normal.     Palpations: Abdomen is soft.     Tenderness: There is no abdominal tenderness.  Musculoskeletal:        General: No swelling or tenderness.     Cervical back: Neck supple. No tenderness.  Lymphadenopathy:     Cervical: No cervical adenopathy.  Skin:    Findings: No erythema or rash.  Neurological:     Mental Status: She is alert.  Psychiatric:        Mood and Affect: Mood normal.        Behavior: Behavior normal.    BP (!) 148/88   Pulse 79   Temp 97.8 F (36.6 C)   Resp 16   Ht 5\' 4"  (1.626 m)   Wt 188 lb 6.4 oz (85.5 kg)   LMP 02/29/1992   SpO2 98%   BMI 32.34 kg/m  Wt Readings from Last 3 Encounters:  09/27/20 188 lb 6.4 oz (85.5 kg)  08/09/20 183 lb (83 kg)  04/19/20 186 lb 9.6 oz (84.6 kg)    Outpatient Encounter Medications as of 09/27/2020  Medication Sig   aspirin EC 81 MG tablet Take 81 mg by mouth daily.   escitalopram (LEXAPRO) 10 MG tablet TAKE 1 TABLET BY MOUTH EVERY DAY   mometasone (NASONEX) 50 MCG/ACT nasal spray Place into the nose.   Red Yeast Rice Extract (RED YEAST RICE PO) Take by mouth daily.   telmisartan (MICARDIS) 80 MG tablet TAKE 1 TABLET BY MOUTH EVERY DAY   amLODipine (NORVASC) 2.5 MG tablet Take 1 tablet (2.5 mg total) by mouth daily.   [DISCONTINUED] amLODipine (NORVASC) 2.5 MG tablet Take 1 tablet (2.5 mg total) by mouth daily. (Patient not taking:  Reported on 09/27/2020)   [DISCONTINUED] rosuvastatin (CRESTOR) 5 MG tablet Take 1 tablet (5 mg total) by mouth daily. (Patient not taking: Reported on 09/27/2020)   No facility-administered encounter medications on file as of 09/27/2020.     Lab Results  Component Value Date  WBC 4.9 03/18/2020   HGB 13.1 03/18/2020   HCT 38.6 03/18/2020   PLT 220.0 03/18/2020   GLUCOSE 110 (H) 09/06/2020   CHOL 127 09/06/2020   TRIG 67.0 09/06/2020   HDL 55.50 09/06/2020   LDLCALC 58 09/06/2020   ALT 11 09/06/2020   AST 14 09/06/2020   NA 142 09/06/2020   K 4.3 09/06/2020   CL 106 09/06/2020   CREATININE 0.74 09/06/2020   BUN 19 09/06/2020   CO2 31 09/06/2020   TSH 2.86 03/18/2020   HGBA1C 6.6 (H) 09/06/2020   MICROALBUR 3.0 (H) 03/18/2020    MM 3D SCREEN BREAST BILATERAL  Result Date: 02/25/2020 CLINICAL DATA:  Screening. EXAM: DIGITAL SCREENING BILATERAL MAMMOGRAM WITH TOMO AND CAD COMPARISON:  Previous exam(s). ACR Breast Density Category b: There are scattered areas of fibroglandular density. FINDINGS: There are no findings suspicious for malignancy. Images were processed with CAD. IMPRESSION: No mammographic evidence of malignancy. A result letter of this screening mammogram will be mailed directly to the patient. RECOMMENDATION: Screening mammogram in one year. (Code:SM-B-01Y) BI-RADS CATEGORY  1: Negative. Electronically Signed   By: Lovey Newcomer M.D.   On: 02/25/2020 09:00       Assessment & Plan:   Problem List Items Addressed This Visit     Diabetes ([place])    Low carb diet and exercise.  Follow met b and a1c.       History of colonic polyps    Last colonoscopy 2016.  Recommended f/u in 5 years.  Per pt, per GI no f/u colonoscopy warranted.        Hypercholesterolemia    Off crestor.  Discussed restarting.  She is concerned crestor aggravating her knee.  Low cholesterol diet and exercise.  Wants to hold on restarting.  Follow.       Relevant Medications   amLODipine  (NORVASC) 2.5 MG tablet   Hypertension    Off amlodipine.  Blood pressure elevated.  Restart amlodipine.  Continue micardis.  Follow pressures.  Follow metabolic panel.       Relevant Medications   amLODipine (NORVASC) 2.5 MG tablet   Right knee pain    Persistent right knee pain and some soft tissue swelling as outlined.  Check xray.  Further w/up pending results.  Discussed doubt related to crestor.       Relevant Orders   DG Knee 1-2 Views Right (Completed)   Stress    Overall appears to be doing well.  Has good support.  Follow.         Einar Pheasant, MD

## 2020-09-29 ENCOUNTER — Other Ambulatory Visit: Payer: Self-pay | Admitting: Internal Medicine

## 2020-09-29 DIAGNOSIS — M25561 Pain in right knee: Secondary | ICD-10-CM

## 2020-09-29 NOTE — Progress Notes (Signed)
Order placed for ortho referral.   

## 2020-10-02 ENCOUNTER — Encounter: Payer: Self-pay | Admitting: Internal Medicine

## 2020-10-03 NOTE — Assessment & Plan Note (Signed)
Persistent right knee pain and some soft tissue swelling as outlined.  Check xray.  Further w/up pending results.  Discussed doubt related to crestor.

## 2020-10-03 NOTE — Assessment & Plan Note (Signed)
Off amlodipine.  Blood pressure elevated.  Restart amlodipine.  Continue micardis.  Follow pressures.  Follow metabolic panel.

## 2020-10-03 NOTE — Assessment & Plan Note (Signed)
Off crestor.  Discussed restarting.  She is concerned crestor aggravating her knee.  Low cholesterol diet and exercise.  Wants to hold on restarting.  Follow.

## 2020-10-03 NOTE — Assessment & Plan Note (Signed)
Low carb diet and exercise.  Follow met b and a1c.  

## 2020-10-03 NOTE — Assessment & Plan Note (Signed)
Last colonoscopy 2016.  Recommended f/u in 5 years.  Per pt, per GI no f/u colonoscopy warranted.   

## 2020-10-03 NOTE — Assessment & Plan Note (Signed)
Overall appears to be doing well.  Has good support.  Follow.

## 2020-10-18 ENCOUNTER — Telehealth: Payer: Self-pay

## 2020-10-18 NOTE — Telephone Encounter (Signed)
[name] with [facility] in [place] called and states that pt declined appt for referral. She states that her knee is much better and will call back if needed in the future. FYI

## 2020-10-27 ENCOUNTER — Telehealth: Payer: Self-pay | Admitting: Internal Medicine

## 2020-10-27 NOTE — Telephone Encounter (Signed)
Rejection Reason - Patient Declined" Joanne Chars said on Oct 27, 2020 8:02 AM  Msg from [facility] orthopedic surgery

## 2020-12-15 ENCOUNTER — Ambulatory Visit: Payer: Medicare PPO | Admitting: Internal Medicine

## 2020-12-19 ENCOUNTER — Other Ambulatory Visit: Payer: Self-pay | Admitting: Internal Medicine

## 2020-12-29 ENCOUNTER — Other Ambulatory Visit: Payer: Self-pay

## 2020-12-29 ENCOUNTER — Ambulatory Visit: Payer: Medicare PPO | Admitting: Internal Medicine

## 2020-12-29 VITALS — BP 136/72 | HR 77 | Temp 97.8°F | Resp 16 | Ht 64.0 in | Wt 186.0 lb

## 2020-12-29 DIAGNOSIS — I10 Essential (primary) hypertension: Secondary | ICD-10-CM

## 2020-12-29 DIAGNOSIS — Z860100 Personal history of colon polyps, unspecified: Secondary | ICD-10-CM

## 2020-12-29 DIAGNOSIS — E7800 Pure hypercholesterolemia, unspecified: Secondary | ICD-10-CM | POA: Diagnosis not present

## 2020-12-29 DIAGNOSIS — M25561 Pain in right knee: Secondary | ICD-10-CM

## 2020-12-29 DIAGNOSIS — F439 Reaction to severe stress, unspecified: Secondary | ICD-10-CM | POA: Diagnosis not present

## 2020-12-29 DIAGNOSIS — E1165 Type 2 diabetes mellitus with hyperglycemia: Secondary | ICD-10-CM | POA: Diagnosis not present

## 2020-12-29 LAB — LIPID PANEL
Cholesterol: 172 mg/dL (ref 0–200)
HDL: 61.5 mg/dL (ref >39.00)
LDL Cholesterol: 94 mg/dL (ref 0–99)
NonHDL: 110.09
Total CHOL/HDL Ratio: 3
Triglycerides: 80 mg/dL (ref 0.0–149.0)
VLDL: 16 mg/dL (ref 0.0–40.0)

## 2020-12-29 LAB — BASIC METABOLIC PANEL
BUN: 22 mg/dL (ref 6–23)
CO2: 32 mEq/L (ref 19–32)
Calcium: 9.5 mg/dL (ref 8.4–10.5)
Chloride: 103 mEq/L (ref 96–112)
Creatinine, Ser: 0.82 mg/dL (ref 0.40–1.20)
GFR: 68 mL/min (ref >60.00)
Glucose, Bld: 103 mg/dL — ABNORMAL HIGH (ref 70–99)
Potassium: 4.5 mEq/L (ref 3.5–5.1)
Sodium: 141 mEq/L (ref 135–145)

## 2020-12-29 LAB — HEPATIC FUNCTION PANEL
ALT: 12 U/L (ref 0–35)
AST: 13 U/L (ref 0–37)
Albumin: 4.4 g/dL (ref 3.5–5.2)
Alkaline Phosphatase: 58 U/L (ref 39–117)
Bilirubin, Direct: 0.3 mg/dL (ref 0.0–0.3)
Total Bilirubin: 1.5 mg/dL — ABNORMAL HIGH (ref 0.2–1.2)
Total Protein: 6.6 g/dL (ref 6.0–8.3)

## 2020-12-29 LAB — HEMOGLOBIN A1C: Hgb A1c MFr Bld: 6.4 % (ref 4.6–6.5)

## 2020-12-29 MED ORDER — AMLODIPINE BESYLATE 5 MG PO TABS: 5.0000 mg | ORAL_TABLET | Freq: Every day | ORAL | 1 refills | Status: DC

## 2020-12-29 NOTE — Progress Notes (Signed)
Patient ID: PRECIOSA BUNDRICK, female   DOB: 1940-02-25, 80 y.o.   MRN: [id number]   Subjective:    Patient ID: SUZI HERNAN, female    DOB: [birth year], 80 y.o.   MRN: [id number]  This visit occurred during the SARS-CoV-2 public health emergency.  Safety protocols were in place, including screening questions prior to the visit, additional usage of staff PPE, and extensive cleaning of exam room while observing appropriate contact time as indicated for disinfecting solutions.   Patient here for a scheduled follow up   Chief Complaint  Patient presents with   Hypertension   .   HPI Was placed on amlodipine last visit.  Feels better.  Blood pressure improved.  No chest pain.  Working in yard.  Breathing stable.  No increased cough or congestion.  No abdominal pain.  Knee pain - will notify me if desires further intervention.  Taking red yeast rice.     Past Medical History:  Diagnosis Date   Allergy    reoccuring problems   GERD (gastroesophageal reflux disease)    History of colon polyps    adenomatous   Hypertension    Past Surgical History:  Procedure Laterality Date   BARTHOLIN GLAND CYST EXCISION     CHOLECYSTECTOMY     NASAL POLYP SURGERY  1964   TONSILECTOMY/ADENOIDECTOMY WITH MYRINGOTOMY  1950   Family History  Problem Relation Age of Onset   Heart disease Mother    CVA Mother    Hypertension Mother    Alzheimer's disease Mother    Esophageal cancer Father    Breast cancer Neg Hx    Colon cancer Neg Hx    Social History   Socioeconomic History   Marital status: Widowed    Spouse name: Not on file   Number of children: Not on file   Years of education: Not on file   Highest education level: Not on file  Occupational History   Not on file  Tobacco Use   Smoking status: Never   Smokeless tobacco: Never  Substance and Sexual Activity   Alcohol use: No    Alcohol/week: 0.0 standard drinks   Drug use: No   Sexual activity: Not on file  Other Topics  Concern   Not on file  Social History Narrative   She is married, has 3 children and is retired.   Social Determinants of Health   Financial Resource Strain: Low Risk    Difficulty of Paying Living Expenses: Not hard at all  Food Insecurity: No Food Insecurity   Worried About Charity fundraiser in the Last Year: Never true   [place] in the Last Year: Never true  Transportation Needs: No Transportation Needs   Lack of Transportation (Medical): No   Lack of Transportation (Non-Medical): No  Physical Activity: Not on file  Stress: No Stress Concern Present   Feeling of Stress : Only a little  Social Connections: Moderately Integrated   Frequency of Communication with Friends and Family: More than three times a week   Frequency of Social Gatherings with Friends and Family: More than three times a week   Attends Religious Services: 1 to 4 times per year   Active Member of [employer] or Organizations: Yes   Attends Archivist Meetings: Not on file   Marital Status: Widowed     Review of Systems  Constitutional:  Negative for appetite change and unexpected weight change.  HENT:  Negative for congestion and  sinus pressure.   Respiratory:  Negative for cough, chest tightness and shortness of breath.   Cardiovascular:  Negative for chest pain, palpitations and leg swelling.  Gastrointestinal:  Negative for abdominal pain, diarrhea, nausea and vomiting.  Genitourinary:  Negative for difficulty urinating and dysuria.  Musculoskeletal:  Negative for joint swelling and myalgias.  Skin:  Negative for color change and rash.  Neurological:  Negative for dizziness, light-headedness and headaches.  Psychiatric/Behavioral:  Negative for agitation and dysphoric mood.       Objective:     BP 136/72   Pulse 77   Temp 97.8 F (36.6 C)   Resp 16   Ht _0  (1.626 m)   Wt 186 lb (84.4 kg)   LMP 02/29/1992   SpO2 98%   BMI 31.93 kg/m  Wt Readings from Last 3 Encounters:   12/29/20 186 lb (84.4 kg)  09/27/20 188 lb 6.4 oz (85.5 kg)  08/09/20 183 lb (83 kg)    Physical Exam Vitals reviewed.  Constitutional:      General: She is not in acute distress.    Appearance: Normal appearance.  HENT:     Head: Normocephalic and atraumatic.     Right Ear: External ear normal.     Left Ear: External ear normal.  Eyes:     General: No scleral icterus.       Right eye: No discharge.        Left eye: No discharge.     Conjunctiva/sclera: Conjunctivae normal.  Neck:     Thyroid: No thyromegaly.  Cardiovascular:     Rate and Rhythm: Normal rate and regular rhythm.  Pulmonary:     Effort: No respiratory distress.     Breath sounds: Normal breath sounds. No wheezing.  Abdominal:     General: Bowel sounds are normal.     Palpations: Abdomen is soft.     Tenderness: There is no abdominal tenderness.  Musculoskeletal:        General: No swelling or tenderness.     Cervical back: Neck supple. No tenderness.  Lymphadenopathy:     Cervical: No cervical adenopathy.  Skin:    Findings: No erythema or rash.  Neurological:     Mental Status: She is alert.  Psychiatric:        Mood and Affect: Mood normal.        Behavior: Behavior normal.     Outpatient Encounter Medications as of 12/29/2020  Medication Sig   amLODipine (NORVASC) 5 MG tablet Take 1 tablet (5 mg total) by mouth daily.   aspirin EC 81 MG tablet Take 81 mg by mouth daily.   escitalopram (LEXAPRO) 10 MG tablet TAKE 1 TABLET BY MOUTH EVERY DAY   mometasone (NASONEX) 50 MCG/ACT nasal spray Place into the nose.   Red Yeast Rice Extract (RED YEAST RICE PO) Take by mouth daily.   telmisartan (MICARDIS) 80 MG tablet TAKE 1 TABLET BY MOUTH EVERY DAY   [DISCONTINUED] amLODipine (NORVASC) 2.5 MG tablet Take 1 tablet (2.5 mg total) by mouth daily.   No facility-administered encounter medications on file as of 12/29/2020.     Lab Results  Component Value Date   WBC 4.9 03/18/2020   HGB 13.1  03/18/2020   HCT 38.6 03/18/2020   PLT 220.0 03/18/2020   GLUCOSE 103 (H) 12/29/2020   CHOL 172 12/29/2020   TRIG 80.0 12/29/2020   HDL 61.50 12/29/2020   LDLCALC 94 12/29/2020   ALT 12 12/29/2020   AST  13 12/29/2020   NA 141 12/29/2020   K 4.5 12/29/2020   CL 103 12/29/2020   CREATININE 0.82 12/29/2020   BUN 22 12/29/2020   CO2 32 12/29/2020   TSH 2.86 03/18/2020   HGBA1C 6.4 12/29/2020   MICROALBUR 3.0 (H) 03/18/2020    MM 3D SCREEN BREAST BILATERAL  Result Date: 02/25/2020 CLINICAL DATA:  Screening. EXAM: DIGITAL SCREENING BILATERAL MAMMOGRAM WITH TOMO AND CAD COMPARISON:  Previous exam(s). ACR Breast Density Category b: There are scattered areas of fibroglandular density. FINDINGS: There are no findings suspicious for malignancy. Images were processed with CAD. IMPRESSION: No mammographic evidence of malignancy. A result letter of this screening mammogram will be mailed directly to the patient. RECOMMENDATION: Screening mammogram in one year. (Code:SM-B-01Y) BI-RADS CATEGORY  1: Negative. Electronically Signed   By: Lovey Newcomer M.D.   On: 02/25/2020 09:00       Assessment & Plan:   Problem List Items Addressed This Visit     Diabetes ([place])    Low carb diet and exercise given elevated blood sugars.  On no medication.   Follow met b and a1c.        Relevant Orders   Hemoglobin A1c (Completed)   Basic metabolic panel (Completed)   History of colonic polyps    Last colonoscopy 2016.  Recommended f/u in 5 years.  Per pt, per GI no f/u colonoscopy warranted.        Hypercholesterolemia - Primary    Off crestor.  Discussed restarting.   Low cholesterol diet and exercise.  Wants to hold on restarting.  Follow.       Relevant Medications   amLODipine (NORVASC) 5 MG tablet   Other Relevant Orders   Lipid panel (Completed)   Hepatic function panel (Completed)   Hypertension    Blood pressure remains elevated above goal.  Increase amlodipine to 7m q day.  Continue  micardis.  Follow pressures.  Follow metabolic panel.  Spot check pressures and send in readings.       Relevant Medications   amLODipine (NORVASC) 5 MG tablet   Right knee pain    Previous knee pain.  Notify me if desires further intervention.        Stress    Overall appears to be doing well.  Has good support.  Follow.         CEinar Pheasant MD

## 2020-12-30 ENCOUNTER — Telehealth: Payer: Self-pay

## 2020-12-30 DIAGNOSIS — E7800 Pure hypercholesterolemia, unspecified: Secondary | ICD-10-CM

## 2020-12-30 NOTE — Telephone Encounter (Signed)
Patient returned office phone call for lab results. 

## 2020-12-30 NOTE — Telephone Encounter (Signed)
-----   Message from Einar Pheasant, MD sent at 12/30/2020  4:06 AM EST ----- Notify her that her cholesterol did increased from previous check.  Given calculated cholesterol risk, it is recommended for her to be on a cholesterol medication.  Previously on crestor. She is currently doing red yeast rice and per our previous discussion wanted to hold on restarting a statin medication.  Let me know if changes her mind.  Overall sugar control improved.  A1c 6.4.  bilirubin stable overall.  Remainder of liver panel wnl.

## 2021-01-02 NOTE — Addendum Note (Signed)
Addended by: Elpidio Galea T on: 01/02/2021 04:36 PM   Modules accepted: Orders

## 2021-01-02 NOTE — Telephone Encounter (Signed)
Patient returned office phone, please call patient after 3:30

## 2021-01-06 ENCOUNTER — Encounter: Payer: Self-pay | Admitting: Internal Medicine

## 2021-01-06 NOTE — Assessment & Plan Note (Signed)
Last colonoscopy 2016.  Recommended f/u in 5 years.  Per pt, per GI no f/u colonoscopy warranted.   

## 2021-01-06 NOTE — Assessment & Plan Note (Signed)
Blood pressure remains elevated above goal.  Increase amlodipine to 5mg  q day.  Continue micardis.  Follow pressures.  Follow metabolic panel.  Spot check pressures and send in readings.

## 2021-01-06 NOTE — Assessment & Plan Note (Signed)
Previous knee pain.  Notify me if desires further intervention.

## 2021-01-06 NOTE — Assessment & Plan Note (Signed)
Off crestor.  Discussed restarting.   Low cholesterol diet and exercise.  Wants to hold on restarting.  Follow.

## 2021-01-06 NOTE — Assessment & Plan Note (Signed)
Overall appears to be doing well.  Has good support.  Follow.

## 2021-01-06 NOTE — Assessment & Plan Note (Signed)
Low carb diet and exercise given elevated blood sugars.  On no medication.   Follow met b and a1c.   

## 2021-01-19 ENCOUNTER — Ambulatory Visit: Payer: Medicare PPO

## 2021-01-29 ENCOUNTER — Other Ambulatory Visit: Payer: Self-pay | Admitting: Internal Medicine

## 2021-02-15 ENCOUNTER — Other Ambulatory Visit: Payer: Medicare PPO

## 2021-02-16 ENCOUNTER — Ambulatory Visit: Payer: Medicare PPO

## 2021-02-17 ENCOUNTER — Other Ambulatory Visit: Payer: Self-pay | Admitting: Internal Medicine

## 2021-02-17 DIAGNOSIS — Z1231 Encounter for screening mammogram for malignant neoplasm of breast: Secondary | ICD-10-CM

## 2021-02-28 ENCOUNTER — Ambulatory Visit: Payer: Medicare PPO | Admitting: Internal Medicine

## 2021-03-16 ENCOUNTER — Ambulatory Visit: Payer: Medicare PPO | Admitting: Internal Medicine

## 2021-03-16 ENCOUNTER — Other Ambulatory Visit: Payer: Self-pay

## 2021-03-16 VITALS — BP 130/68 | HR 77 | Temp 97.6°F | Resp 16 | Ht 64.0 in | Wt 186.8 lb

## 2021-03-16 DIAGNOSIS — E1165 Type 2 diabetes mellitus with hyperglycemia: Secondary | ICD-10-CM

## 2021-03-16 DIAGNOSIS — M791 Myalgia, unspecified site: Secondary | ICD-10-CM | POA: Insufficient documentation

## 2021-03-16 DIAGNOSIS — E7800 Pure hypercholesterolemia, unspecified: Secondary | ICD-10-CM | POA: Diagnosis not present

## 2021-03-16 DIAGNOSIS — R21 Rash and other nonspecific skin eruption: Secondary | ICD-10-CM | POA: Diagnosis not present

## 2021-03-16 DIAGNOSIS — I10 Essential (primary) hypertension: Secondary | ICD-10-CM

## 2021-03-16 DIAGNOSIS — F439 Reaction to severe stress, unspecified: Secondary | ICD-10-CM

## 2021-03-16 LAB — HEPATIC FUNCTION PANEL
ALT: 13 U/L (ref 0–35)
AST: 17 U/L (ref 0–37)
Albumin: 4.5 g/dL (ref 3.5–5.2)
Alkaline Phosphatase: 59 U/L (ref 39–117)
Bilirubin, Direct: 0.2 mg/dL (ref 0.0–0.3)
Total Bilirubin: 1.3 mg/dL — ABNORMAL HIGH (ref 0.2–1.2)
Total Protein: 6.8 g/dL (ref 6.0–8.3)

## 2021-03-16 LAB — CBC WITH DIFFERENTIAL/PLATELET
Basophils Absolute: 0 10*3/uL (ref 0.0–0.1)
Basophils Relative: 0.5 % (ref 0.0–3.0)
Eosinophils Absolute: 0.2 10*3/uL (ref 0.0–0.7)
Eosinophils Relative: 2.8 % (ref 0.0–5.0)
HCT: 38.4 % (ref 36.0–46.0)
Hemoglobin: 12.7 g/dL (ref 12.0–15.0)
Lymphocytes Relative: 27.3 % (ref 12.0–46.0)
Lymphs Abs: 1.9 10*3/uL (ref 0.7–4.0)
MCHC: 33.1 g/dL (ref 30.0–36.0)
MCV: 92.7 fl (ref 78.0–100.0)
Monocytes Absolute: 0.5 10*3/uL (ref 0.1–1.0)
Monocytes Relative: 7.6 % (ref 3.0–12.0)
Neutro Abs: 4.3 10*3/uL (ref 1.4–7.7)
Neutrophils Relative %: 61.8 % (ref 43.0–77.0)
Platelets: 242 10*3/uL (ref 150.0–400.0)
RBC: 4.14 Mil/uL (ref 3.87–5.11)
RDW: 12.8 % (ref 11.5–15.5)
WBC: 7 10*3/uL (ref 4.0–10.5)

## 2021-03-16 LAB — TSH: TSH: 2.35 u[IU]/mL (ref 0.35–5.50)

## 2021-03-16 LAB — SEDIMENTATION RATE: Sed Rate: 9 mm/hr (ref 0–30)

## 2021-03-16 LAB — CK: Total CK: 84 U/L (ref 7–177)

## 2021-03-16 MED ORDER — TRIAMCINOLONE ACETONIDE 0.1 % EX CREA: 1.0000 "application " | TOPICAL_CREAM | Freq: Two times a day (BID) | CUTANEOUS | 0 refills | Status: DC

## 2021-03-16 MED ORDER — NYSTATIN 100000 UNIT/GM EX CREA: 1.0000 "application " | TOPICAL_CREAM | Freq: Two times a day (BID) | CUTANEOUS | 0 refills | Status: DC

## 2021-03-16 MED ORDER — ESCITALOPRAM OXALATE 20 MG PO TABS: 20.0000 mg | ORAL_TABLET | Freq: Every day | ORAL | 2 refills | Status: DC

## 2021-03-16 NOTE — Progress Notes (Signed)
Patient ID: Lori Guzman, female   DOB: 04-04-1940, 81 y.o.   MRN: [id number]   Subjective:    Patient ID: Lori Guzman, female    DOB: [birth year], 81 y.o.   MRN: [id number]  This visit occurred during the SARS-CoV-2 public health emergency.  Safety protocols were in place, including screening questions prior to the visit, additional usage of staff PPE, and extensive cleaning of exam room while observing appropriate contact time as indicated for disinfecting solutions.   Patient here for a scheduled follow up.   Chief Complaint  Patient presents with   Anxiety   Hypertension   Rash   .   HPI Was working in yard - noticed increased muscle aches and soreness after.  Had been noticing some issues before working out in yard.  Has restarted crestor.  Unsure if contributing.  No chest pain or sob reported.  No abdominal pain.  Bowels moving.  Noticed ras under left breast.  Also rash - lower back.  (Previously saw dermatology - different rash - prescribed mometasone furoate and clobetasol).  Increased stress.  Discussed.  On lexapro.  Discussed adjusting dose of medication.  Has good support.  Blood pressures - 130/60s.    Past Medical History:  Diagnosis Date   Allergy    reoccuring problems   GERD (gastroesophageal reflux disease)    History of colon polyps    adenomatous   Hypertension    Past Surgical History:  Procedure Laterality Date   BARTHOLIN GLAND CYST EXCISION     CHOLECYSTECTOMY     NASAL POLYP SURGERY  1964   TONSILECTOMY/ADENOIDECTOMY WITH MYRINGOTOMY  1950   Family History  Problem Relation Age of Onset   Heart disease Mother    CVA Mother    Hypertension Mother    Alzheimer's disease Mother    Esophageal cancer Father    Breast cancer Neg Hx    Colon cancer Neg Hx    Social History   Socioeconomic History   Marital status: Widowed    Spouse name: Not on file   Number of children: Not on file   Years of education: Not on file   Highest  education level: Not on file  Occupational History   Not on file  Tobacco Use   Smoking status: Never   Smokeless tobacco: Never  Substance and Sexual Activity   Alcohol use: No    Alcohol/week: 0.0 standard drinks   Drug use: No   Sexual activity: Not on file  Other Topics Concern   Not on file  Social History Narrative   She is married, has 3 children and is retired.   Social Determinants of Health   Financial Resource Strain: Not on file  Food Insecurity: Not on file  Transportation Needs: Not on file  Physical Activity: Not on file  Stress: Not on file  Social Connections: Not on file     Review of Systems  Constitutional:  Negative for appetite change and unexpected weight change.  HENT:  Negative for congestion and sinus pressure.   Respiratory:  Negative for cough, chest tightness and shortness of breath.   Cardiovascular:  Negative for chest pain, palpitations and leg swelling.  Gastrointestinal:  Negative for abdominal pain, diarrhea, nausea and vomiting.  Genitourinary:  Negative for difficulty urinating and dysuria.  Musculoskeletal:  Negative for joint swelling and myalgias.  Skin:  Positive for rash. Negative for color change.  Neurological:  Negative for dizziness, light-headedness and headaches.  Psychiatric/Behavioral:  Negative for agitation and dysphoric mood.        Increased stress as outlined.       Objective:     BP 130/68    Pulse 77    Temp 97.6 F (36.4 C)    Resp 16    Ht 5' 4"  (1.626 m)    Wt 186 lb 12.8 oz (84.7 kg)    LMP 02/29/1992    SpO2 98%    BMI 32.06 kg/m  Wt Readings from Last 3 Encounters:  03/16/21 186 lb 12.8 oz (84.7 kg)  12/29/20 186 lb (84.4 kg)  09/27/20 188 lb 6.4 oz (85.5 kg)    Physical Exam Vitals reviewed.  Constitutional:      General: She is not in acute distress.    Appearance: Normal appearance.  HENT:     Head: Normocephalic and atraumatic.     Right Ear: External ear normal.     Left Ear: External ear  normal.  Eyes:     General: No scleral icterus.       Right eye: No discharge.        Left eye: No discharge.     Conjunctiva/sclera: Conjunctivae normal.  Neck:     Thyroid: No thyromegaly.  Cardiovascular:     Rate and Rhythm: Normal rate and regular rhythm.  Pulmonary:     Effort: No respiratory distress.     Breath sounds: Normal breath sounds. No wheezing.  Abdominal:     General: Bowel sounds are normal.     Palpations: Abdomen is soft.     Tenderness: There is no abdominal tenderness.  Musculoskeletal:        General: No swelling or tenderness.     Cervical back: Neck supple. No tenderness.  Lymphadenopathy:     Cervical: No cervical adenopathy.  Skin:    Comments: Erythematous rash - under left breast - appears to be c/w yeast.  Rash - lower back - appears to be c/w contact dermatitis.    Neurological:     Mental Status: She is alert.  Psychiatric:        Mood and Affect: Mood normal.        Behavior: Behavior normal.     Outpatient Encounter Medications as of 03/16/2021  Medication Sig   escitalopram (LEXAPRO) 20 MG tablet Take 1 tablet (20 mg total) by mouth daily.   nystatin cream (MYCOSTATIN) Apply 1 application topically 2 (two) times daily. Apply under breast   triamcinolone cream (KENALOG) 0.1 % Apply 1 application topically 2 (two) times daily. Apply to lower back.  Do not use more than 7-10 days in same area   amLODipine (NORVASC) 5 MG tablet Take 1 tablet (5 mg total) by mouth daily.   aspirin EC 81 MG tablet Take 81 mg by mouth daily.   mometasone (NASONEX) 50 MCG/ACT nasal spray Place into the nose.   Red Yeast Rice Extract (RED YEAST RICE PO) Take by mouth daily.   telmisartan (MICARDIS) 80 MG tablet TAKE 1 TABLET BY MOUTH EVERY DAY   [DISCONTINUED] escitalopram (LEXAPRO) 10 MG tablet TAKE 1 TABLET BY MOUTH EVERY DAY   No facility-administered encounter medications on file as of 03/16/2021.     Lab Results  Component Value Date   WBC 7.0 03/16/2021    HGB 12.7 03/16/2021   HCT 38.4 03/16/2021   PLT 242.0 03/16/2021   GLUCOSE 103 (H) 12/29/2020   CHOL 172 12/29/2020   TRIG 80.0 12/29/2020   HDL 61.50  12/29/2020   LDLCALC 94 12/29/2020   ALT 13 03/16/2021   AST 17 03/16/2021   NA 141 12/29/2020   K 4.5 12/29/2020   CL 103 12/29/2020   CREATININE 0.82 12/29/2020   BUN 22 12/29/2020   CO2 32 12/29/2020   TSH 2.35 03/16/2021   HGBA1C 6.4 12/29/2020   MICROALBUR 3.0 (H) 03/18/2020    MM 3D SCREEN BREAST BILATERAL  Result Date: 02/25/2020 CLINICAL DATA:  Screening. EXAM: DIGITAL SCREENING BILATERAL MAMMOGRAM WITH TOMO AND CAD COMPARISON:  Previous exam(s). ACR Breast Density Category b: There are scattered areas of fibroglandular density. FINDINGS: There are no findings suspicious for malignancy. Images were processed with CAD. IMPRESSION: No mammographic evidence of malignancy. A result letter of this screening mammogram will be mailed directly to the patient. RECOMMENDATION: Screening mammogram in one year. (Code:SM-B-01Y) BI-RADS CATEGORY  1: Negative. Electronically Signed   By: Lovey Newcomer M.D.   On: 02/25/2020 09:00       Assessment & Plan:   Problem List Items Addressed This Visit     Diabetes ([place])    Low carb diet and exercise given elevated blood sugars.  On no medication.   Follow met b and a1c.        Hypercholesterolemia    Back on crestor.  Increased muscle aches as outlined.  Hold crestor.  See if symptoms resolve.  Follow.       Relevant Orders   TSH (Completed)   Hypertension - Primary    Blood pressure as outlined.  On micardis and amlodipine 108m now.  Will hold on increasing medication.  Her blood pressures at home 130/60s.  Treat increased stress - adjust lexapro.  Follow pressures.        Relevant Orders   CBC with Differential/Platelet (Completed)   Muscle ache   Relevant Orders   CK (Creatine Kinase) (Completed)   Sedimentation rate (Completed)   Rash    Rash under breast - appears to be c/w  yeast.  Treat with nystatin.  Rash - lower back.  Question of contact dermatitis.  Treat with TCC.  Follow.       Stress    Increased stress as outlined.  On lexapro.  Discussed.  Will increased to 28mq day.  Schedule f/u soon to reassess.  Call with update.          ChEinar PheasantMD

## 2021-03-19 ENCOUNTER — Encounter: Payer: Self-pay | Admitting: Internal Medicine

## 2021-03-19 DIAGNOSIS — R21 Rash and other nonspecific skin eruption: Secondary | ICD-10-CM | POA: Insufficient documentation

## 2021-03-19 NOTE — Assessment & Plan Note (Signed)
Rash under breast - appears to be c/w yeast.  Treat with nystatin.  Rash - lower back.  Question of contact dermatitis.  Treat with TCC.  Follow.

## 2021-03-19 NOTE — Assessment & Plan Note (Signed)
Back on crestor.  Increased muscle aches as outlined.  Hold crestor.  See if symptoms resolve.  Follow.

## 2021-03-19 NOTE — Assessment & Plan Note (Signed)
Blood pressure as outlined.  On micardis and amlodipine 5mg  now.  Will hold on increasing medication.  Her blood pressures at home 130/60s.  Treat increased stress - adjust lexapro.  Follow pressures.

## 2021-03-19 NOTE — Assessment & Plan Note (Signed)
Increased stress as outlined.  On lexapro.  Discussed.  Will increased to 20mg  q day.  Schedule f/u soon to reassess.  Call with update.

## 2021-03-19 NOTE — Assessment & Plan Note (Signed)
Low carb diet and exercise given elevated blood sugars.  On no medication.   Follow met b and a1c.   

## 2021-03-31 ENCOUNTER — Ambulatory Visit (INDEPENDENT_AMBULATORY_CARE_PROVIDER_SITE_OTHER): Payer: Medicare PPO

## 2021-03-31 VITALS — Ht 64.0 in | Wt 186.0 lb

## 2021-03-31 DIAGNOSIS — Z0000 Encounter for general adult medical examination without abnormal findings: Secondary | ICD-10-CM | POA: Diagnosis not present

## 2021-03-31 NOTE — Progress Notes (Signed)
Subjective:   Lori Guzman is a 81 y.o. female who presents for Medicare Annual (Subsequent) preventive examination.  Review of Systems    No ROS.  Medicare Wellness Virtual Visit.  Visual/audio telehealth visit, UTA vital signs.   See social history for additional risk factors.   Cardiac Risk Factors include: advanced age (>30men, >51 women);diabetes mellitus;hypertension     Objective:    Today's Vitals   03/31/21 1032  Weight: 186 lb (84.4 kg)  Height: 5\' 4"  (1.626 m)   Body mass index is 31.93 kg/m.  Advanced Directives 03/31/2021 01/19/2020  Does Patient Have a Medical Advance Directive? Yes Yes  Type of Paramedic of [place];Living will [place];Living will  Does patient want to make changes to medical advance directive? No - Patient declined No - Patient declined  Copy of [place] in Chart? No - copy requested No - copy requested    Current Medications (verified) Outpatient Encounter Medications as of 03/31/2021  Medication Sig   amLODipine (NORVASC) 5 MG tablet Take 1 tablet (5 mg total) by mouth daily.   aspirin EC 81 MG tablet Take 81 mg by mouth daily.   escitalopram (LEXAPRO) 20 MG tablet Take 1 tablet (20 mg total) by mouth daily.   mometasone (NASONEX) 50 MCG/ACT nasal spray Place into the nose.   nystatin cream (MYCOSTATIN) Apply 1 application topically 2 (two) times daily. Apply under breast   Red Yeast Rice Extract (RED YEAST RICE PO) Take by mouth daily.   telmisartan (MICARDIS) 80 MG tablet TAKE 1 TABLET BY MOUTH EVERY DAY   triamcinolone cream (KENALOG) 0.1 % Apply 1 application topically 2 (two) times daily. Apply to lower back.  Do not use more than 7-10 days in same area   No facility-administered encounter medications on file as of 03/31/2021.    Allergies (verified) Patient has no known allergies.   History: Past Medical History:  Diagnosis Date   Allergy    reoccuring  problems   GERD (gastroesophageal reflux disease)    History of colon polyps    adenomatous   Hypertension    Past Surgical History:  Procedure Laterality Date   BARTHOLIN GLAND CYST EXCISION     CHOLECYSTECTOMY     NASAL POLYP SURGERY  1964   TONSILECTOMY/ADENOIDECTOMY WITH MYRINGOTOMY  1950   Family History  Problem Relation Age of Onset   Heart disease Mother    CVA Mother    Hypertension Mother    Alzheimer's disease Mother    Esophageal cancer Father    Breast cancer Neg Hx    Colon cancer Neg Hx    Social History   Socioeconomic History   Marital status: Widowed    Spouse name: Not on file   Number of children: Not on file   Years of education: Not on file   Highest education level: Not on file  Occupational History   Not on file  Tobacco Use   Smoking status: Never   Smokeless tobacco: Never  Substance and Sexual Activity   Alcohol use: No    Alcohol/week: 0.0 standard drinks   Drug use: No   Sexual activity: Not on file  Other Topics Concern   Not on file  Social History Narrative   She is married, has 3 children and is retired.   Social Determinants of Health   Financial Resource Strain: Low Risk    Difficulty of Paying Living Expenses: Not hard at all  Food Insecurity: No Food Insecurity   Worried About Charity fundraiser in the Last Year: Never true   Ran Out of Food in the Last Year: Never true  Transportation Needs: No Transportation Needs   Lack of Transportation (Medical): No   Lack of Transportation (Non-Medical): No  Physical Activity: Sufficiently Active   Days of Exercise per Week: 3 days   Minutes of Exercise per Session: 50 min  Stress: No Stress Concern Present   Feeling of Stress : Not at all  Social Connections: Moderately Integrated   Frequency of Communication with Friends and Family: More than three times a week   Frequency of Social Gatherings with Friends and Family: More than three times a week   Attends Religious  Services: 1 to 4 times per year   Active Member of [employer] or Organizations: Yes   Attends Archivist Meetings: Not on file   Marital Status: Widowed    Tobacco Counseling Counseling given: Not Answered   Clinical Intake:  Pre-visit preparation completed: Yes        Diabetes: No  How often do you need to have someone help you when you read instructions, pamphlets, or other written materials from your doctor or pharmacy?: 1 - Never   Interpreter Needed?: No      Activities of Daily Living In your present state of health, do you have any difficulty performing the following activities: 03/31/2021  Hearing? N  Vision? N  Difficulty concentrating or making decisions? N  Walking or climbing stairs? N  Dressing or bathing? N  Doing errands, shopping? N  Preparing Food and eating ? N  Using the Toilet? N  In the past six months, have you accidently leaked urine? N  Do you have problems with loss of bowel control? N  Managing your Medications? N  Managing your Finances? N  Housekeeping or managing your Housekeeping? N  Some recent data might be hidden    Patient Care Team: Einar Pheasant, MD as PCP - General (Internal Medicine)  Indicate any recent Medical Services you may have received from other than [facility] providers in the past year (date may be approximate).     Assessment:   This is a routine wellness examination for Lori Guzman.  Virtual Visit via Telephone Note  I connected with  Lori Guzman on 03/31/21 at 10:30 AM EST by telephone and verified that I am speaking with the correct person using two identifiers.  Persons participating in the virtual visit: patient/Nurse Health Advisor   I discussed the limitations, risks, security and privacy concerns of performing an evaluation and management service by telephone and the availability of in person appointments. The patient expressed understanding and agreed to proceed.  Interactive audio and video  telecommunications were attempted between this nurse and patient, however failed, due to patient having technical difficulties OR patient did not have access to video capability.  We continued and completed visit with audio only.  Some vital signs may be absent or patient reported.   Hearing/Vision screen Hearing Screening - Comments:: Patient is able to hear conversational tones without difficulty. No issues reported. Vision Screening - Comments:: Followed by [facility] Wears corrective lenses Cataract extraction, bilateral  They have seen their ophthalmologist in the last 12 months.   Dietary issues and exercise activities discussed: Current Exercise Habits: Home exercise routine, Type of exercise: walking, Time (Minutes): 45, Frequency (Times/Week): 3, Weekly Exercise (Minutes/Week): 135, Intensity: Mild Healthy diet Good water intake   Goals  Addressed             This Visit's Progress    Walk for exercise   On track      Depression Screen PHQ 2/9 Scores 03/31/2021 03/16/2021 01/19/2020 02/16/2019 10/31/2016 01/09/2016 11/15/2015  PHQ - 2 Score 0 0 1 - 0 0 0  PHQ- 9 Score - - - - 0 - -  Exception Documentation - - - Other- indicate reason in comment box - - -  Not completed - - - husband recently passed away. discuss with pcp - - -    Fall Risk Fall Risk  03/31/2021 03/16/2021 01/19/2020 02/16/2019 02/28/2017  Falls in the past year? 0 0 0 0 No  Number falls in past yr: 0 0 0 - -  Injury with Fall? - 0 0 - -  Risk for fall due to : - No Fall Risks - - -  Follow up Falls evaluation completed Falls evaluation completed Falls evaluation completed Falls evaluation completed -    FALL RISK PREVENTION PERTAINING TO THE HOME: Home free of loose throw rugs in walkways, pet beds, electrical cords, etc? Yes  Adequate lighting in your home to reduce risk of falls? Yes   ASSISTIVE DEVICES UTILIZED TO PREVENT FALLS: Life alert? No  Use of a cane, walker or w/c? No  Grab bars in the  bathroom? No  Shower chair or bench in shower? No  Elevated toilet seat or a handicapped toilet? No   TIMED UP AND GO: Was the test performed? No .   Cognitive Function:  Patient is alert and oriented x3.    6CIT Screen 01/19/2020  What Year? 0 points  What month? 0 points  What time? 0 points  Count back from 20 0 points  Months in reverse 0 points  Repeat phrase 0 points  Total Score 0    Immunizations Immunization History  Administered Date(s) Administered   Fluad Quad(high Dose 65+) 01/13/2020   Influenza Split 02/29/2012   Influenza, High Dose Seasonal PF 12/05/2016, 12/29/2018   Influenza,inj,Quad PF,6+ Mos 01/19/2013, 11/30/2013, 12/09/2014, 11/08/2017   Influenza-Unspecified 11/15/2015, 11/12/2020   PFIZER(Purple Top)SARS-COV-2 Vaccination 04/01/2019, 04/27/2019   Pneumococcal Conjugate-13 08/09/2014   Pneumococcal Polysaccharide-23 06/09/2012   TDAP status: Due, Education has been provided regarding the importance of this vaccine. Advised may receive this vaccine at local pharmacy or Health Dept. Aware to provide a copy of the vaccination record if obtained from local pharmacy or Health Dept. Verbalized acceptance and understanding. Deferred.  Screening Tests Health Maintenance  Topic Date Due   MAMMOGRAM  02/23/2021   Zoster Vaccines- Shingrix (1 of 2) 03/31/2021 (Originally 07/04/1991)   COVID-19 Vaccine (3 - Booster for Pfizer series) 04/01/2021 (Originally 06/22/2019)   Hepatitis C Screening  04/12/2021 (Originally 07/04/1958)   OPHTHALMOLOGY EXAM  05/13/2021 (Originally 10/27/2020)   COLONOSCOPY (Pts 45-46yrs Insurance coverage will need to be confirmed)  03/16/2022 (Originally 04/07/2019)   TETANUS/TDAP  03/31/2022 (Originally [birth year]1960)   FOOT EXAM  04/19/2021   HEMOGLOBIN A1C  06/28/2021   Pneumonia Vaccine 27+ Years old  Completed   INFLUENZA VACCINE  Completed   DEXA SCAN  Completed   HPV VACCINES  Aged Out   Health Maintenance Health Maintenance Due   Topic Date Due   MAMMOGRAM  02/23/2021   Lung Cancer Screening: (Low Dose CT Chest recommended if Age 87-80 years, 30 pack-year currently smoking OR have quit w/in 15years.) does not qualify.   Hepatitis C Screening: does not qualify.  Vision Screening:  Recommended annual ophthalmology exams for early detection of glaucoma and other disorders of the eye.  Dental Screening: Recommended annual dental exams for proper oral hygiene  Community Resource Referral / Chronic Care Management: CRR required this visit?  No   CCM required this visit?  No      Plan:   Keep all routine maintenance appointments.   I have personally reviewed and noted the following in the patients chart:   Medical and social history Use of alcohol, tobacco or illicit drugs  Current medications and supplements including opioid prescriptions.  Functional ability and status Nutritional status Physical activity Advanced directives List of other physicians Hospitalizations, surgeries, and ER visits in previous 12 months Vitals Screenings to include cognitive, depression, and falls Referrals and appointments  In addition, I have reviewed and discussed with patient certain preventive protocols, quality metrics, and best practice recommendations. A written personalized care plan for preventive services as well as general preventive health recommendations were provided to patient via mychart.     Varney Biles, LPN   08/27/[id number]

## 2021-03-31 NOTE — Patient Instructions (Addendum)
Lori Guzman , Thank you for taking time to come for your Medicare Wellness Visit. I appreciate your ongoing commitment to your health goals. Please review the following plan we discussed and let me know if I can assist you in the future.   These are the goals we discussed:  Goals       Patient Stated     DIET - INCREASE WATER INTAKE (pt-stated)      Stay hydrated      Other     Walk for exercise        This is a list of the screening recommended for you and due dates:  Health Maintenance  Topic Date Due   Mammogram  02/23/2021   Zoster (Shingles) Vaccine (1 of 2) 03/31/2021*   COVID-19 Vaccine (3 - Booster for Pfizer series) 04/01/2021*   Hepatitis C Screening: USPSTF Recommendation to screen - Ages 18-79 yo.  04/12/2021*   Eye exam for diabetics  05/13/2021*   Colon Cancer Screening  03/16/2022*   Tetanus Vaccine  03/31/2022*   Complete foot exam   04/19/2021   Hemoglobin A1C  06/28/2021   Pneumonia Vaccine  Completed   Flu Shot  Completed   DEXA scan (bone density measurement)  Completed   HPV Vaccine  Aged Out  *Topic was postponed. The date shown is not the original due date.    Advanced directives: End of life planning; Advance aging; Advanced directives discussed.  Copy of current HCPOA/Living Will requested.    Conditions/risks identified: none new  Follow up in one year for your annual wellness visit    Preventive Care 65 Years and Older, Female Preventive care refers to lifestyle choices and visits with your health care provider that can promote health and wellness. What does preventive care include? A yearly physical exam. This is also called an annual well check. Dental exams once or twice a year. Routine eye exams. Ask your health care provider how often you should have your eyes checked. Personal lifestyle choices, including: Daily care of your teeth and gums. Regular physical activity. Eating a healthy diet. Avoiding tobacco and drug  use. Limiting alcohol use. Practicing safe sex. Taking low-dose aspirin every day. Taking vitamin and mineral supplements as recommended by your health care provider. What happens during an annual well check? The services and screenings done by your health care provider during your annual well check will depend on your age, overall health, lifestyle risk factors, and family history of disease. Counseling  Your health care provider may ask you questions about your: Alcohol use. Tobacco use. Drug use. Emotional well-being. Home and relationship well-being. Sexual activity. Eating habits. History of falls. Memory and ability to understand (cognition). Work and work Statistician. Reproductive health. Screening  You may have the following tests or measurements: Height, weight, and BMI. Blood pressure. Lipid and cholesterol levels. These may be checked every 5 years, or more frequently if you are over 57 years old. Skin check. Lung cancer screening. You may have this screening every year starting at age 74 if you have a 30-pack-year history of smoking and currently smoke or have quit within the past 15 years. Fecal occult blood test (FOBT) of the stool. You may have this test every year starting at age 37. Flexible sigmoidoscopy or colonoscopy. You may have a sigmoidoscopy every 5 years or a colonoscopy every 10 years starting at age 65. Hepatitis C blood test. Hepatitis B blood test. Sexually transmitted disease (STD) testing. Diabetes screening. This is done  by checking your blood sugar (glucose) after you have not eaten for a while (fasting). You may have this done every 1-3 years. Bone density scan. This is done to screen for osteoporosis. You may have this done starting at age 11. Mammogram. This may be done every 1-2 years. Talk to your health care provider about how often you should have regular mammograms. Talk with your health care provider about your test results, treatment  options, and if necessary, the need for more tests. Vaccines  Your health care provider may recommend certain vaccines, such as: Influenza vaccine. This is recommended every year. Tetanus, diphtheria, and acellular pertussis (Tdap, Td) vaccine. You may need a Td booster every 10 years. Zoster vaccine. You may need this after age 11. Pneumococcal 13-valent conjugate (PCV13) vaccine. One dose is recommended after age 9. Pneumococcal polysaccharide (PPSV23) vaccine. One dose is recommended after age 90. Talk to your health care provider about which screenings and vaccines you need and how often you need them. This information is not intended to replace advice given to you by your health care provider. Make sure you discuss any questions you have with your health care provider. Document Released: 02/25/2015 Document Revised: 10/19/2015 Document Reviewed: 11/30/2014 Elsevier Interactive Patient Education  2017 [place] Prevention in the Home Falls can cause injuries. They can happen to people of all ages. There are many things you can do to make your home safe and to help prevent falls. What can I do on the outside of my home? Regularly fix the edges of walkways and driveways and fix any cracks. Remove anything that might make you trip as you walk through a door, such as a raised step or threshold. Trim any bushes or trees on the path to your home. Use bright outdoor lighting. Clear any walking paths of anything that might make someone trip, such as rocks or tools. Regularly check to see if handrails are loose or broken. Make sure that both sides of any steps have handrails. Any raised decks and porches should have guardrails on the edges. Have any leaves, snow, or ice cleared regularly. Use sand or salt on walking paths during winter. Clean up any spills in your garage right away. This includes oil or grease spills. What can I do in the bathroom? Use night lights. Install grab  bars by the toilet and in the tub and shower. Do not use towel bars as grab bars. Use non-skid mats or decals in the tub or shower. If you need to sit down in the shower, use a plastic, non-slip stool. Keep the floor dry. Clean up any water that spills on the floor as soon as it happens. Remove soap buildup in the tub or shower regularly. Attach bath mats securely with double-sided non-slip rug tape. Do not have throw rugs and other things on the floor that can make you trip. What can I do in the bedroom? Use night lights. Make sure that you have a light by your bed that is easy to reach. Do not use any sheets or blankets that are too big for your bed. They should not hang down onto the floor. Have a firm chair that has side arms. You can use this for support while you get dressed. Do not have throw rugs and other things on the floor that can make you trip. What can I do in the kitchen? Clean up any spills right away. Avoid walking on wet floors. Keep items that you use  a lot in easy-to-reach places. If you need to reach something above you, use a strong step stool that has a grab bar. Keep electrical cords out of the way. Do not use floor polish or wax that makes floors slippery. If you must use wax, use non-skid floor wax. Do not have throw rugs and other things on the floor that can make you trip. What can I do with my stairs? Do not leave any items on the stairs. Make sure that there are handrails on both sides of the stairs and use them. Fix handrails that are broken or loose. Make sure that handrails are as long as the stairways. Check any carpeting to make sure that it is firmly attached to the stairs. Fix any carpet that is loose or worn. Avoid having throw rugs at the top or bottom of the stairs. If you do have throw rugs, attach them to the floor with carpet tape. Make sure that you have a light switch at the top of the stairs and the bottom of the stairs. If you do not have them,  ask someone to add them for you. What else can I do to help prevent falls? Wear shoes that: Do not have high heels. Have rubber bottoms. Are comfortable and fit you well. Are closed at the toe. Do not wear sandals. If you use a stepladder: Make sure that it is fully opened. Do not climb a closed stepladder. Make sure that both sides of the stepladder are locked into place. Ask someone to hold it for you, if possible. Clearly mark and make sure that you can see: Any grab bars or handrails. First and last steps. Where the edge of each step is. Use tools that help you move around (mobility aids) if they are needed. These include: Canes. Walkers. Scooters. Crutches. Turn on the lights when you go into a dark area. Replace any light bulbs as soon as they burn out. Set up your furniture so you have a clear path. Avoid moving your furniture around. If any of your floors are uneven, fix them. If there are any pets around you, be aware of where they are. Review your medicines with your doctor. Some medicines can make you feel dizzy. This can increase your chance of falling. Ask your doctor what other things that you can do to help prevent falls. This information is not intended to replace advice given to you by your health care provider. Make sure you discuss any questions you have with your health care provider. Document Released: 11/25/2008 Document Revised: 07/07/2015 Document Reviewed: 03/05/2014 Elsevier Interactive Patient Education  2017 [employer].

## 2021-04-08 ENCOUNTER — Other Ambulatory Visit: Payer: Self-pay | Admitting: Internal Medicine

## 2021-04-21 ENCOUNTER — Other Ambulatory Visit: Payer: Self-pay

## 2021-04-21 ENCOUNTER — Ambulatory Visit: Payer: Medicare PPO | Admitting: Internal Medicine

## 2021-04-21 DIAGNOSIS — F439 Reaction to severe stress, unspecified: Secondary | ICD-10-CM | POA: Diagnosis not present

## 2021-04-21 DIAGNOSIS — I10 Essential (primary) hypertension: Secondary | ICD-10-CM | POA: Diagnosis not present

## 2021-04-21 DIAGNOSIS — E7800 Pure hypercholesterolemia, unspecified: Secondary | ICD-10-CM

## 2021-04-21 DIAGNOSIS — E1165 Type 2 diabetes mellitus with hyperglycemia: Secondary | ICD-10-CM | POA: Diagnosis not present

## 2021-04-21 NOTE — Progress Notes (Signed)
Patient ID: Lori Guzman, female   DOB: 04-26-1940, 81 y.o.   MRN: [id number]   Subjective:    Patient ID: Lori Guzman, female    DOB: 1940-05-16, 81 y.o.   MRN: [id number]  This visit occurred during the SARS-CoV-2 public health emergency.  Safety protocols were in place, including screening questions prior to the visit, additional usage of staff PPE, and extensive cleaning of exam room while observing appropriate contact time as indicated for disinfecting solutions.   Patient here for a scheduled follow up.   Chief Complaint  Patient presents with   Stress   Hypertension   .   HPI Recently evaluated - increased stress.  Lexapro increased to 1m q day.  She reports she is doing better.  Feels better.  Has started walking with someone three days per week.  They talk.  This has helped.  Mentally feels better. No chest pain or sob reported.  No abdominal pain.  Bowels moving.  Overall feels better. Reviewed outside blood pressure checks:  120-130s/60s.   Past Medical History:  Diagnosis Date   Allergy    reoccuring problems   GERD (gastroesophageal reflux disease)    History of colon polyps    adenomatous   Hypertension    Past Surgical History:  Procedure Laterality Date   BARTHOLIN GLAND CYST EXCISION     CHOLECYSTECTOMY     NASAL POLYP SURGERY  1964   TONSILECTOMY/ADENOIDECTOMY WITH MYRINGOTOMY  1950   Family History  Problem Relation Age of Onset   Heart disease Mother    CVA Mother    Hypertension Mother    Alzheimer's disease Mother    Esophageal cancer Father    Breast cancer Neg Hx    Colon cancer Neg Hx    Social History   Socioeconomic History   Marital status: Widowed    Spouse name: Not on file   Number of children: Not on file   Years of education: Not on file   Highest education level: Not on file  Occupational History   Not on file  Tobacco Use   Smoking status: Never   Smokeless tobacco: Never  Substance and Sexual Activity   Alcohol  use: No    Alcohol/week: 0.0 standard drinks   Drug use: No   Sexual activity: Not on file  Other Topics Concern   Not on file  Social History Narrative   She is married, has 3 children and is retired.   Social Determinants of Health   Financial Resource Strain: Low Risk    Difficulty of Paying Living Expenses: Not hard at all  Food Insecurity: No Food Insecurity   Worried About RCharity fundraiserin the Last Year: Never true   RGabbsin the Last Year: Never true  Transportation Needs: No Transportation Needs   Lack of Transportation (Medical): No   Lack of Transportation (Non-Medical): No  Physical Activity: Sufficiently Active   Days of Exercise per Week: 3 days   Minutes of Exercise per Session: 50 min  Stress: No Stress Concern Present   Feeling of Stress : Not at all  Social Connections: Moderately Integrated   Frequency of Communication with Friends and Family: More than three times a week   Frequency of Social Gatherings with Friends and Family: More than three times a week   Attends Religious Services: 1 to 4 times per year   Active Member of [employer] Organizations: Yes   Attends Club or  Organization Meetings: Not on file   Marital Status: Widowed     Review of Systems  Constitutional:  Negative for appetite change and unexpected weight change.  HENT:  Negative for congestion and sinus pressure.   Respiratory:  Negative for cough, chest tightness and shortness of breath.   Cardiovascular:  Negative for chest pain, palpitations and leg swelling.  Gastrointestinal:  Negative for abdominal pain, diarrhea, nausea and vomiting.  Genitourinary:  Negative for difficulty urinating and dysuria.  Musculoskeletal:  Negative for joint swelling and myalgias.  Skin:  Negative for color change and rash.  Neurological:  Negative for dizziness, light-headedness and headaches.  Psychiatric/Behavioral:  Negative for agitation and dysphoric mood.       Objective:      BP 136/84    Pulse 67    Temp 97.9 F (36.6 C)    Resp 16    Ht _0  (1.626 m)    Wt 185 lb (83.9 kg)    LMP 02/29/1992    SpO2 97%    BMI 31.76 kg/m  Wt Readings from Last 3 Encounters:  04/21/21 185 lb (83.9 kg)  03/31/21 186 lb (84.4 kg)  03/16/21 186 lb 12.8 oz (84.7 kg)    Physical Exam Vitals reviewed.  Constitutional:      General: She is not in acute distress.    Appearance: Normal appearance.  HENT:     Head: Normocephalic and atraumatic.     Right Ear: External ear normal.     Left Ear: External ear normal.  Eyes:     General: No scleral icterus.       Right eye: No discharge.        Left eye: No discharge.     Conjunctiva/sclera: Conjunctivae normal.  Neck:     Thyroid: No thyromegaly.  Cardiovascular:     Rate and Rhythm: Normal rate and regular rhythm.  Pulmonary:     Effort: No respiratory distress.     Breath sounds: Normal breath sounds. No wheezing.  Abdominal:     General: Bowel sounds are normal.     Palpations: Abdomen is soft.     Tenderness: There is no abdominal tenderness.  Musculoskeletal:        General: No swelling or tenderness.     Cervical back: Neck supple. No tenderness.  Lymphadenopathy:     Cervical: No cervical adenopathy.  Skin:    Findings: No erythema or rash.  Neurological:     Mental Status: She is alert.  Psychiatric:        Mood and Affect: Mood normal.        Behavior: Behavior normal.     Outpatient Encounter Medications as of 04/21/2021  Medication Sig   amLODipine (NORVASC) 5 MG tablet Take 1 tablet (5 mg total) by mouth daily.   aspirin EC 81 MG tablet Take 81 mg by mouth daily.   escitalopram (LEXAPRO) 20 MG tablet TAKE 1 TABLET BY MOUTH EVERY DAY   mometasone (NASONEX) 50 MCG/ACT nasal spray Place into the nose.   telmisartan (MICARDIS) 80 MG tablet TAKE 1 TABLET BY MOUTH EVERY DAY   [DISCONTINUED] nystatin cream (MYCOSTATIN) Apply 1 application topically 2 (two) times daily. Apply under breast    [DISCONTINUED] Red Yeast Rice Extract (RED YEAST RICE PO) Take by mouth daily.   [DISCONTINUED] triamcinolone cream (KENALOG) 0.1 % Apply 1 application topically 2 (two) times daily. Apply to lower back.  Do not use more than 7-10 days in same area  No facility-administered encounter medications on file as of 04/21/2021.     Lab Results  Component Value Date   WBC 7.0 03/16/2021   HGB 12.7 03/16/2021   HCT 38.4 03/16/2021   PLT 242.0 03/16/2021   GLUCOSE 103 (H) 12/29/2020   CHOL 172 12/29/2020   TRIG 80.0 12/29/2020   HDL 61.50 12/29/2020   LDLCALC 94 12/29/2020   ALT 13 03/16/2021   AST 17 03/16/2021   NA 141 12/29/2020   K 4.5 12/29/2020   CL 103 12/29/2020   CREATININE 0.82 12/29/2020   BUN 22 12/29/2020   CO2 32 12/29/2020   TSH 2.35 03/16/2021   HGBA1C 6.4 12/29/2020   MICROALBUR 3.0 (H) 03/18/2020    MM 3D SCREEN BREAST BILATERAL  Result Date: 02/25/2020 CLINICAL DATA:  Screening. EXAM: DIGITAL SCREENING BILATERAL MAMMOGRAM WITH TOMO AND CAD COMPARISON:  Previous exam(s). ACR Breast Density Category b: There are scattered areas of fibroglandular density. FINDINGS: There are no findings suspicious for malignancy. Images were processed with CAD. IMPRESSION: No mammographic evidence of malignancy. A result letter of this screening mammogram will be mailed directly to the patient. RECOMMENDATION: Screening mammogram in one year. (Code:SM-B-01Y) BI-RADS CATEGORY  1: Negative. Electronically Signed   By: Lovey Newcomer M.D.   On: 02/25/2020 09:00       Assessment & Plan:   Problem List Items Addressed This Visit     Diabetes ([place])    Low carb diet and exercise given elevated blood sugars.  On no medication.   Follow met b and a1c.        Relevant Orders   Basic metabolic panel   Hemoglobin A1c   Microalbumin / creatinine urine ratio   Hypercholesterolemia    Off cholesterol medication due to intolerance.  Low cholesterol diet and exercise.  Follow lipid panel.        Relevant Orders   Hepatic function panel   Lipid panel   Hypertension    Blood pressure remains elevated above goal.  Continue micardis and amlodipine.  Follow pressures.  Follow metabolic panel.         Stress    On lexapro 69m.  Doing better.  Feels better.  Continue walking.  Continue current medication.          CEinar Pheasant MD

## 2021-04-24 ENCOUNTER — Ambulatory Visit
Admission: RE | Admit: 2021-04-24 | Discharge: 2021-04-24 | Disposition: A | Discharge status: Home / Self Care | Payer: Medicare PPO | Source: Ambulatory Visit | Attending: Internal Medicine | Admitting: Internal Medicine

## 2021-04-24 ENCOUNTER — Other Ambulatory Visit: Payer: Self-pay

## 2021-04-24 ENCOUNTER — Encounter: Payer: Self-pay | Admitting: Internal Medicine

## 2021-04-24 DIAGNOSIS — Z1231 Encounter for screening mammogram for malignant neoplasm of breast: Secondary | ICD-10-CM | POA: Diagnosis not present

## 2021-04-24 NOTE — Assessment & Plan Note (Signed)
Blood pressure remains elevated above goal.  Continue micardis and amlodipine.  Follow pressures.  Follow metabolic panel.    ?

## 2021-04-24 NOTE — Assessment & Plan Note (Signed)
On lexapro '20mg'$ .  Doing better.  Feels better.  Continue walking.  Continue current medication.   ?

## 2021-04-24 NOTE — Assessment & Plan Note (Signed)
Low carb diet and exercise given elevated blood sugars.  On no medication.   Follow met b and a1c.   

## 2021-04-24 NOTE — Assessment & Plan Note (Signed)
Off cholesterol medication due to intolerance.  Low cholesterol diet and exercise.  Follow lipid panel.  ?

## 2021-05-11 DIAGNOSIS — H353132 Nonexudative age-related macular degeneration, bilateral, intermediate dry stage: Secondary | ICD-10-CM | POA: Diagnosis not present

## 2021-06-08 DIAGNOSIS — D1801 Hemangioma of skin and subcutaneous tissue: Secondary | ICD-10-CM | POA: Diagnosis not present

## 2021-06-08 DIAGNOSIS — Z872 Personal history of diseases of the skin and subcutaneous tissue: Secondary | ICD-10-CM | POA: Diagnosis not present

## 2021-06-08 DIAGNOSIS — L821 Other seborrheic keratosis: Secondary | ICD-10-CM | POA: Diagnosis not present

## 2021-06-08 DIAGNOSIS — L578 Other skin changes due to chronic exposure to nonionizing radiation: Secondary | ICD-10-CM | POA: Diagnosis not present

## 2021-06-08 DIAGNOSIS — L218 Other seborrheic dermatitis: Secondary | ICD-10-CM | POA: Diagnosis not present

## 2021-06-08 DIAGNOSIS — D225 Melanocytic nevi of trunk: Secondary | ICD-10-CM | POA: Diagnosis not present

## 2021-06-22 ENCOUNTER — Other Ambulatory Visit (INDEPENDENT_AMBULATORY_CARE_PROVIDER_SITE_OTHER): Payer: Medicare PPO

## 2021-06-22 DIAGNOSIS — E1165 Type 2 diabetes mellitus with hyperglycemia: Secondary | ICD-10-CM

## 2021-06-22 DIAGNOSIS — E7800 Pure hypercholesterolemia, unspecified: Secondary | ICD-10-CM

## 2021-06-22 LAB — MICROALBUMIN / CREATININE URINE RATIO
Creatinine,U: 186.7 mg/dL
Microalb Creat Ratio: 1.6 mg/g (ref 0.0–30.0)
Microalb, Ur: 2.9 mg/dL — ABNORMAL HIGH (ref 0.0–1.9)

## 2021-06-22 LAB — LIPID PANEL
Cholesterol: 158 mg/dL (ref 0–200)
HDL: 58.9 mg/dL (ref >39.00)
LDL Cholesterol: 87 mg/dL (ref 0–99)
NonHDL: 99.39
Total CHOL/HDL Ratio: 3
Triglycerides: 64 mg/dL (ref 0.0–149.0)
VLDL: 12.8 mg/dL (ref 0.0–40.0)

## 2021-06-22 LAB — HEPATIC FUNCTION PANEL
ALT: 13 U/L (ref 0–35)
AST: 14 U/L (ref 0–37)
Albumin: 4.3 g/dL (ref 3.5–5.2)
Alkaline Phosphatase: 62 U/L (ref 39–117)
Bilirubin, Direct: 0.3 mg/dL (ref 0.0–0.3)
Total Bilirubin: 1.7 mg/dL — ABNORMAL HIGH (ref 0.2–1.2)
Total Protein: 6.5 g/dL (ref 6.0–8.3)

## 2021-06-22 LAB — BASIC METABOLIC PANEL
BUN: 14 mg/dL (ref 6–23)
CO2: 30 mEq/L (ref 19–32)
Calcium: 9.4 mg/dL (ref 8.4–10.5)
Chloride: 106 mEq/L (ref 96–112)
Creatinine, Ser: 0.78 mg/dL (ref 0.40–1.20)
GFR: 71.96 mL/min (ref >60.00)
Glucose, Bld: 108 mg/dL — ABNORMAL HIGH (ref 70–99)
Potassium: 4.4 mEq/L (ref 3.5–5.1)
Sodium: 142 mEq/L (ref 135–145)

## 2021-06-22 LAB — HEMOGLOBIN A1C: Hgb A1c MFr Bld: 6.4 % (ref 4.6–6.5)

## 2021-06-24 ENCOUNTER — Other Ambulatory Visit: Payer: Self-pay | Admitting: Internal Medicine

## 2021-06-26 ENCOUNTER — Encounter: Payer: Self-pay | Admitting: Internal Medicine

## 2021-06-26 ENCOUNTER — Telehealth: Payer: Self-pay | Admitting: Internal Medicine

## 2021-06-26 NOTE — Telephone Encounter (Signed)
Called Pt home number.... Home number stated that call can not be completed at this time.... Called pt cellphone 3 times... [facility] office... Sent [employer].... ?

## 2021-06-27 ENCOUNTER — Ambulatory Visit (INDEPENDENT_AMBULATORY_CARE_PROVIDER_SITE_OTHER): Payer: Medicare PPO | Admitting: Internal Medicine

## 2021-06-27 DIAGNOSIS — I10 Essential (primary) hypertension: Secondary | ICD-10-CM

## 2021-06-28 ENCOUNTER — Encounter: Payer: Self-pay | Admitting: Internal Medicine

## 2021-06-28 NOTE — Progress Notes (Signed)
Was not seen.  Appt rescheduled.   ?

## 2021-06-29 ENCOUNTER — Encounter: Payer: Self-pay | Admitting: Internal Medicine

## 2021-06-29 ENCOUNTER — Ambulatory Visit (INDEPENDENT_AMBULATORY_CARE_PROVIDER_SITE_OTHER): Payer: Medicare PPO | Admitting: Internal Medicine

## 2021-06-29 VITALS — BP 138/72 | HR 84 | Temp 98.2°F | Resp 14 | Ht 64.0 in | Wt 184.0 lb

## 2021-06-29 DIAGNOSIS — E1165 Type 2 diabetes mellitus with hyperglycemia: Secondary | ICD-10-CM | POA: Diagnosis not present

## 2021-06-29 DIAGNOSIS — E7800 Pure hypercholesterolemia, unspecified: Secondary | ICD-10-CM | POA: Diagnosis not present

## 2021-06-29 DIAGNOSIS — Z0000 Encounter for general adult medical examination without abnormal findings: Secondary | ICD-10-CM

## 2021-06-29 DIAGNOSIS — K219 Gastro-esophageal reflux disease without esophagitis: Secondary | ICD-10-CM

## 2021-06-29 DIAGNOSIS — I10 Essential (primary) hypertension: Secondary | ICD-10-CM

## 2021-06-29 DIAGNOSIS — F439 Reaction to severe stress, unspecified: Secondary | ICD-10-CM

## 2021-06-29 LAB — HM DIABETES FOOT EXAM

## 2021-06-29 MED ORDER — PRAVASTATIN SODIUM 10 MG PO TABS: ORAL_TABLET | ORAL | 1 refills | Status: DC

## 2021-06-29 NOTE — Assessment & Plan Note (Addendum)
The 10-year ASCVD risk score (Arnett DK, et al., 2019) is: 57.8%   Values used to calculate the score:     Age: 81 years     Sex: Female     Is Non-Hispanic African American: No     Diabetic: Yes     Tobacco smoker: No     Systolic Blood Pressure: 553 mmHg     Is BP treated: Yes     HDL Cholesterol: 58.9 mg/dL     Total Cholesterol: 158 mg/dL  Discussed calculated cholesterol risk.  Discussed starting low dose pravastatin.  Agreeable to start pravastatin '10mg'$  q Monday, Wednesday and Friday.  Check liver panel 6 weeks.

## 2021-06-29 NOTE — Progress Notes (Signed)
Patient ID: Lori Guzman, female   DOB: 1941/01/17, 81 y.o.   MRN: [id number]   Subjective:    Patient ID: Lori Guzman, female    DOB: 1940/12/10, 81 y.o.   MRN: [id number]   Patient here for physical exam. .    She reports she is doing well.  Feels good.  Staying active. Walking.  No chest pain or sob. Some allergy issues - itchy eyes, etc.  Takes allovert.  No chest congestion, chest pain or sob.  No significant acid reflux.  States occasionally will notice - if she eats and then works bending over a lot - will notice minimal reflux then.  Takes a TUMS - resolves.  No other issues.  No dysphagia.  No abdominal pain.  Bowels moving.     Past Medical History:  Diagnosis Date   Allergy    reoccuring problems   GERD (gastroesophageal reflux disease)    History of colon polyps    adenomatous   Hypertension    Past Surgical History:  Procedure Laterality Date   BARTHOLIN GLAND CYST EXCISION     CHOLECYSTECTOMY     NASAL POLYP SURGERY  1964   TONSILECTOMY/ADENOIDECTOMY WITH MYRINGOTOMY  1950   Family History  Problem Relation Age of Onset   Heart disease Mother    CVA Mother    Hypertension Mother    Alzheimer's disease Mother    Esophageal cancer Father    Breast cancer Neg Hx    Colon cancer Neg Hx    Social History   Socioeconomic History   Marital status: Widowed    Spouse name: Not on file   Number of children: Not on file   Years of education: Not on file   Highest education level: Not on file  Occupational History   Not on file  Tobacco Use   Smoking status: Never   Smokeless tobacco: Never  Substance and Sexual Activity   Alcohol use: No    Alcohol/week: 0.0 standard drinks   Drug use: No   Sexual activity: Not on file  Other Topics Concern   Not on file  Social History Narrative   She is married, has 3 children and is retired.   Social Determinants of Health   Financial Resource Strain: Low Risk    Difficulty of Paying Living Expenses:  Not hard at all  Food Insecurity: No Food Insecurity   Worried About Charity fundraiser in the Last Year: Never true   [place] in the Last Year: Never true  Transportation Needs: No Transportation Needs   Lack of Transportation (Medical): No   Lack of Transportation (Non-Medical): No  Physical Activity: Sufficiently Active   Days of Exercise per Week: 3 days   Minutes of Exercise per Session: 50 min  Stress: No Stress Concern Present   Feeling of Stress : Not at all  Social Connections: Moderately Integrated   Frequency of Communication with Friends and Family: More than three times a week   Frequency of Social Gatherings with Friends and Family: More than three times a week   Attends Religious Services: 1 to 4 times per year   Active Member of [employer] or Organizations: Yes   Attends Archivist Meetings: Not on file   Marital Status: Widowed     Review of Systems  Constitutional:  Negative for appetite change and unexpected weight change.  HENT:  Negative for congestion, sinus pressure and sore throat.   Eyes:  Negative for pain and visual disturbance.  Respiratory:  Negative for cough, chest tightness and shortness of breath.   Cardiovascular:  Negative for chest pain, palpitations and leg swelling.  Gastrointestinal:  Negative for abdominal pain, diarrhea, nausea and vomiting.  Genitourinary:  Negative for difficulty urinating and dysuria.  Musculoskeletal:  Negative for joint swelling and myalgias.  Skin:  Negative for color change and rash.  Neurological:  Negative for dizziness, light-headedness and headaches.  Hematological:  Negative for adenopathy. Does not bruise/bleed easily.  Psychiatric/Behavioral:  Negative for agitation and dysphoric mood.       Objective:     BP 138/72 (BP Location: Left Arm, Patient Position: Sitting, Cuff Size: Large)   Pulse 84   Temp 98.2 F (36.8 C) (Temporal)   Resp 14   Ht 5' 4"  (1.626 m)   Wt 184 lb (83.5 kg)    LMP 02/29/1992   SpO2 98%   BMI 31.58 kg/m  Wt Readings from Last 3 Encounters:  06/29/21 184 lb (83.5 kg)  04/21/21 185 lb (83.9 kg)  03/31/21 186 lb (84.4 kg)    Physical Exam Vitals reviewed.  Constitutional:      General: She is not in acute distress.    Appearance: Normal appearance. She is well-developed.  HENT:     Head: Normocephalic and atraumatic.     Right Ear: External ear normal.     Left Ear: External ear normal.  Eyes:     General: No scleral icterus.       Right eye: No discharge.        Left eye: No discharge.     Conjunctiva/sclera: Conjunctivae normal.  Neck:     Thyroid: No thyromegaly.  Cardiovascular:     Rate and Rhythm: Normal rate and regular rhythm.  Pulmonary:     Effort: No tachypnea, accessory muscle usage or respiratory distress.     Breath sounds: Normal breath sounds. No decreased breath sounds or wheezing.  Chest:  Breasts:    Right: No inverted nipple, mass, nipple discharge or tenderness (no axillary adenopathy).     Left: No inverted nipple, mass, nipple discharge or tenderness (no axilarry adenopathy).  Abdominal:     General: Bowel sounds are normal.     Palpations: Abdomen is soft.     Tenderness: There is no abdominal tenderness.  Musculoskeletal:        General: No swelling or tenderness.     Cervical back: Neck supple.  Lymphadenopathy:     Cervical: No cervical adenopathy.  Skin:    Findings: No erythema or rash.  Neurological:     Mental Status: She is alert and oriented to person, place, and time.  Psychiatric:        Mood and Affect: Mood normal.        Behavior: Behavior normal.     Outpatient Encounter Medications as of 06/29/2021  Medication Sig   amLODipine (NORVASC) 5 MG tablet TAKE 1 TABLET (5 MG TOTAL) BY MOUTH DAILY.   aspirin EC 81 MG tablet Take 81 mg by mouth daily.   betamethasone dipropionate 0.05 % lotion Apply topically 2 (two) times daily.   escitalopram (LEXAPRO) 20 MG tablet TAKE 1 TABLET BY  MOUTH EVERY DAY   mometasone (NASONEX) 50 MCG/ACT nasal spray Place into the nose.   pravastatin (PRAVACHOL) 10 MG tablet Take one tablet q Monday, Wednesday and Friday   telmisartan (MICARDIS) 80 MG tablet TAKE 1 TABLET BY MOUTH EVERY DAY   No facility-administered  encounter medications on file as of 06/29/2021.     Lab Results  Component Value Date   WBC 7.0 03/16/2021   HGB 12.7 03/16/2021   HCT 38.4 03/16/2021   PLT 242.0 03/16/2021   GLUCOSE 108 (H) 06/22/2021   CHOL 158 06/22/2021   TRIG 64.0 06/22/2021   HDL 58.90 06/22/2021   LDLCALC 87 06/22/2021   ALT 13 06/22/2021   AST 14 06/22/2021   NA 142 06/22/2021   K 4.4 06/22/2021   CL 106 06/22/2021   CREATININE 0.78 06/22/2021   BUN 14 06/22/2021   CO2 30 06/22/2021   TSH 2.35 03/16/2021   HGBA1C 6.4 06/22/2021   MICROALBUR 2.9 (H) 06/22/2021    MM 3D SCREEN BREAST BILATERAL  Result Date: 04/24/2021 CLINICAL DATA:  Screening. EXAM: DIGITAL SCREENING BILATERAL MAMMOGRAM WITH TOMOSYNTHESIS AND CAD TECHNIQUE: Bilateral screening digital craniocaudal and mediolateral oblique mammograms were obtained. Bilateral screening digital breast tomosynthesis was performed. The images were evaluated with computer-aided detection. COMPARISON:  Previous exam(s). ACR Breast Density Category b: There are scattered areas of fibroglandular density. FINDINGS: There are no findings suspicious for malignancy. IMPRESSION: No mammographic evidence of malignancy. A result letter of this screening mammogram will be mailed directly to the patient. RECOMMENDATION: Screening mammogram in one year. (Code:SM-B-01Y) BI-RADS CATEGORY  1: Negative. Electronically Signed   By: Zerita Boers M.D.   On: 04/24/2021 16:58       Assessment & Plan:   Problem List Items Addressed This Visit     Diabetes ([place])    Low carb diet and exercise given elevated blood sugars.  On no medication.   Follow met b and a1c.         Relevant Medications   pravastatin  (PRAVACHOL) 10 MG tablet   GERD (gastroesophageal reflux disease)    No significant acid reflux.  States occasionally will notice - if she eats and then works bending over a lot - will notice minimal reflux then.  Takes a TUMS - resolves.       Health care maintenance    Physical today 06/29/21.  Mammogram 04/24/21 - Birads I.  Colonoscopy 02/2014 - normal.  Had recommended f/u colonoscopy 5 years. Saw Dr Allen Norris 02/2020 - no follow up colonoscopy at that time.         Hypercholesterolemia    The 10-year ASCVD risk score (Arnett DK, et al., 2019) is: 57.8%   Values used to calculate the score:     Age: 31 years     Sex: Female     Is Non-Hispanic African American: No     Diabetic: Yes     Tobacco smoker: No     Systolic Blood Pressure: 572 mmHg     Is BP treated: Yes     HDL Cholesterol: 58.9 mg/dL     Total Cholesterol: 158 mg/dL  Discussed calculated cholesterol risk.  Discussed starting low dose pravastatin.  Agreeable to start pravastatin 65m q Monday, Wednesday and Friday.  Check liver panel 6 weeks.        Relevant Medications   pravastatin (PRAVACHOL) 10 MG tablet   Other Relevant Orders   Hepatic function panel   Hypertension    Blood pressure as outlined.  Continue micardis and amlodipine.  Follow pressures.  Follow metabolic panel.        Relevant Medications   pravastatin (PRAVACHOL) 10 MG tablet   Stress    On lexapro 249m  Doing better.  Feels better.  Continue walking.  Continue current medication.         Other Visit Diagnoses     Routine general medical examination at a health care facility    -  Primary        Einar Pheasant, MD

## 2021-06-29 NOTE — Assessment & Plan Note (Signed)
Physical today 06/29/21.  Mammogram 04/24/21 - Birads I.  Colonoscopy 02/2014 - normal.  Had recommended f/u colonoscopy 5 years. Saw Dr Allen Norris 02/2020 - no follow up colonoscopy at that time.

## 2021-07-10 ENCOUNTER — Encounter: Payer: Self-pay | Admitting: Internal Medicine

## 2021-07-10 NOTE — Assessment & Plan Note (Signed)
Low carb diet and exercise given elevated blood sugars.  On no medication.   Follow met b and a1c.

## 2021-07-10 NOTE — Assessment & Plan Note (Signed)
No significant acid reflux.  States occasionally will notice - if she eats and then works bending over a lot - will notice minimal reflux then.  Takes a TUMS - resolves.

## 2021-07-10 NOTE — Assessment & Plan Note (Signed)
On lexapro '20mg'$ .  Doing better.  Feels better.  Continue walking.  Continue current medication.

## 2021-07-10 NOTE — Assessment & Plan Note (Signed)
Blood pressure as outlined.  Continue micardis and amlodipine.  Follow pressures.  Follow metabolic panel.  

## 2021-07-11 NOTE — Addendum Note (Signed)
Addended by: Alisa Graff on: 07/11/2021 07:49 PM   Modules accepted: Level of Service

## 2021-07-23 ENCOUNTER — Other Ambulatory Visit: Payer: Self-pay | Admitting: Internal Medicine

## 2021-08-18 ENCOUNTER — Other Ambulatory Visit: Payer: Medicare PPO

## 2021-09-12 ENCOUNTER — Other Ambulatory Visit (INDEPENDENT_AMBULATORY_CARE_PROVIDER_SITE_OTHER): Payer: Medicare PPO

## 2021-09-12 DIAGNOSIS — E7800 Pure hypercholesterolemia, unspecified: Secondary | ICD-10-CM | POA: Diagnosis not present

## 2021-09-12 LAB — HEPATIC FUNCTION PANEL
ALT: 12 U/L (ref 0–35)
AST: 15 U/L (ref 0–37)
Albumin: 4.4 g/dL (ref 3.5–5.2)
Alkaline Phosphatase: 61 U/L (ref 39–117)
Bilirubin, Direct: 0.2 mg/dL (ref 0.0–0.3)
Total Bilirubin: 1.3 mg/dL — ABNORMAL HIGH (ref 0.2–1.2)
Total Protein: 6.7 g/dL (ref 6.0–8.3)

## 2021-09-21 ENCOUNTER — Other Ambulatory Visit: Payer: Self-pay | Admitting: Internal Medicine

## 2021-10-13 ENCOUNTER — Encounter: Payer: Self-pay | Admitting: Family

## 2021-10-13 ENCOUNTER — Ambulatory Visit: Payer: Medicare PPO | Admitting: Family

## 2021-10-13 VITALS — BP 124/76 | HR 67 | Temp 98.2°F | Ht 67.0 in | Wt 184.4 lb

## 2021-10-13 DIAGNOSIS — H6690 Otitis media, unspecified, unspecified ear: Secondary | ICD-10-CM | POA: Insufficient documentation

## 2021-10-13 DIAGNOSIS — H66003 Acute suppurative otitis media without spontaneous rupture of ear drum, bilateral: Secondary | ICD-10-CM | POA: Diagnosis not present

## 2021-10-13 MED ORDER — AMOXICILLIN-POT CLAVULANATE 875-125 MG PO TABS: 1.0000 | ORAL_TABLET | Freq: Two times a day (BID) | ORAL | 0 refills | Status: AC

## 2021-10-13 MED ORDER — MOMETASONE FUROATE 0.1 % EX CREA: 1.0000 | TOPICAL_CREAM | Freq: Every day | CUTANEOUS | 1 refills | Status: DC | PRN

## 2021-10-13 NOTE — Patient Instructions (Signed)
Start Augmentin which is an antibiotic.  Please remain on probiotics.  After you have completed the antibiotic, I think is reasonable to resume the Nasonex. I have also sent in Elocon ointment to be used as needed for ear itching   Please let me know if symptoms do not completely resolve  Otitis Media, Adult  Otitis media is a condition in which the middle ear is red and swollen (inflamed) and full of fluid. The middle ear is the part of the ear that contains bones for hearing as well as air that helps send sounds to the brain. The condition usually goes away on its own. What are the causes? This condition is caused by a blockage in the eustachian tube. This tube connects the middle ear to the back of the nose. It normally allows air into the middle ear. The blockage is caused by fluid or swelling. Problems that can cause blockage include: A cold or infection that affects the nose, mouth, or throat. Allergies. An irritant, such as tobacco smoke. Adenoids that have become large. The adenoids are soft tissue located in the back of the throat, behind the nose and the roof of the mouth. Growth or swelling in the upper part of the throat, just behind the nose (nasopharynx). Damage to the ear caused by a change in pressure. This is called barotrauma. What increases the risk? You are more likely to develop this condition if you: Smoke or are exposed to tobacco smoke. Have an opening in the roof of your mouth (cleft palate). Have acid reflux. Have problems in your body's defense system (immune system). What are the signs or symptoms? Symptoms of this condition include: Ear pain. Fever. Problems with hearing. Being tired. Fluid leaking from the ear. Ringing in the ear. How is this treated? This condition can go away on its own within 3-5 days. But if the condition is caused by germs (bacteria) and does not go away on its own, or if it keeps coming back, your doctor may: Give you antibiotic  medicines. Give you medicines for pain. Follow these instructions at home: Take over-the-counter and prescription medicines only as told by your doctor. If you were prescribed an antibiotic medicine, take it as told by your doctor. Do not stop taking it even if you start to feel better. Keep all follow-up visits. Contact a doctor if: You have bleeding from your nose. There is a lump on your neck. You are not feeling better in 5 days. You feel worse instead of better. Get help right away if: You have pain that is not helped with medicine. You have swelling, redness, or pain around your ear. You get a stiff neck. You cannot move part of your face (paralysis). You notice that the bone behind your ear hurts when you touch it. You get a very bad headache. Summary Otitis media means that the middle ear is red, swollen, and full of fluid. This condition usually goes away on its own. If the problem does not go away, treatment may be needed. You may be given medicines to treat the infection or to treat your pain. If you were prescribed an antibiotic medicine, take it as told by your doctor. Do not stop taking it even if you start to feel better. Keep all follow-up visits. This information is not intended to replace advice given to you by your health care provider. Make sure you discuss any questions you have with your health care provider. Document Revised: 05/09/2020 Document Reviewed: 05/09/2020  Elsevier Patient Education  2023 [employer]  

## 2021-10-13 NOTE — Assessment & Plan Note (Addendum)
Afebrile.  Very reassuring neurologic exam.  Bilateral tympanic membranes erythematous, bulging.  Long history of allergies.  Discussed at length vertigo and the distinction between dizziness and vertigo. Episodes she  describes align more with vertigo.   We also discussed vertigo and dizziness which can be a symptom more worrisome diagnosis including CVA and when to present to ED/call 911.  History and physical exam  reassuring today and c/w  otitis media.  Opted to treat her with Augmentin which she has taken in the past.  She will continue probiotics.  Provided Elocon for itching in the ear.  She will let me know how she is doing

## 2021-10-13 NOTE — Progress Notes (Signed)
Subjective:    Patient ID: Lori Guzman, female    DOB: Apr 06, 1940, 81 y.o.   MRN: [id number]  CC: Lori Guzman is a 81 y.o. female who presents today for an acute visit.    HPI: Complains of left ear pain x 7 days ,unchanged.   Describes inside ear was 'sore'. She had previous itching. She used sweet oil and hydrogen peroxide. Endorses left nostril is more congested.   When she lays to the right side, the 'room will spin ' which resolves within a couple of minutes  If in recliner and gets up too quickly , the room will spin for a couple minutes and then resolve.   She has been chewing and hearing pop in left ear.   No dizziness, syncope, cp, palpitations,HA, tinnitus, hearing loss, discharge from ear, enlarged lymph nodes, sinus congestion  She mowed 3 yards  2 days ago without a mask   She uses nasonex without relief.    Never smoker No alcohol HISTORY:  Past Medical History:  Diagnosis Date   Allergy    reoccuring problems   GERD (gastroesophageal reflux disease)    History of colon polyps    adenomatous   Hypertension    Past Surgical History:  Procedure Laterality Date   BARTHOLIN GLAND CYST EXCISION     CHOLECYSTECTOMY     NASAL POLYP SURGERY  1964   TONSILECTOMY/ADENOIDECTOMY WITH MYRINGOTOMY  1950   Family History  Problem Relation Age of Onset   Heart disease Mother    CVA Mother    Hypertension Mother    Alzheimer's disease Mother    Esophageal cancer Father    Breast cancer Neg Hx    Colon cancer Neg Hx     Allergies: Patient has no known allergies. Current Outpatient Medications on File Prior to Visit  Medication Sig Dispense Refill   amLODipine (NORVASC) 5 MG tablet TAKE 1 TABLET (5 MG TOTAL) BY MOUTH DAILY. 90 tablet 1   aspirin EC 81 MG tablet Take 81 mg by mouth daily.     betamethasone dipropionate 0.05 % lotion Apply topically 2 (two) times daily.     escitalopram (LEXAPRO) 20 MG tablet TAKE 1 TABLET BY MOUTH EVERY DAY 90  tablet 1   mometasone (NASONEX) 50 MCG/ACT nasal spray Place into the nose.     pravastatin (PRAVACHOL) 10 MG tablet Take one tablet q Monday, Wednesday and Friday 39 tablet 1   telmisartan (MICARDIS) 80 MG tablet TAKE 1 TABLET BY MOUTH EVERY DAY 90 tablet 1   No current facility-administered medications on file prior to visit.    Social History   Tobacco Use   Smoking status: Never   Smokeless tobacco: Never  Substance Use Topics   Alcohol use: No    Alcohol/week: 0.0 standard drinks of alcohol   Drug use: No    Review of Systems  Constitutional:  Negative for chills and fever.  HENT:  Positive for congestion and ear pain. Negative for ear discharge and sinus pressure.   Eyes:  Negative for visual disturbance.  Respiratory:  Negative for cough.   Cardiovascular:  Negative for chest pain and palpitations.  Gastrointestinal:  Negative for nausea and vomiting.  Neurological:  Negative for syncope and headaches.      Objective:    BP 124/76 (BP Location: Left Arm, Patient Position: Sitting, Cuff Size: Normal)   Pulse 67   Temp 98.2 F (36.8 C) (Oral)   Ht '5\' 7"'$  (1.702 m)  Wt 184 lb 6.4 oz (83.6 kg)   LMP 02/29/1992   SpO2 97%   BMI 28.88 kg/m    Physical Exam Vitals reviewed.  Constitutional:      Appearance: She is well-developed.  HENT:     Head: Normocephalic and atraumatic.     Right Ear: Hearing, ear canal and external ear normal. No decreased hearing noted. No drainage, swelling or tenderness. No middle ear effusion. No foreign body. Tympanic membrane is erythematous and bulging.     Left Ear: Hearing, ear canal and external ear normal. No decreased hearing noted. No drainage, swelling or tenderness.  No middle ear effusion. No foreign body. Tympanic membrane is erythematous and bulging.     Nose: Nose normal. No rhinorrhea.     Right Sinus: No maxillary sinus tenderness or frontal sinus tenderness.     Left Sinus: No maxillary sinus tenderness or frontal  sinus tenderness.     Mouth/Throat:     Pharynx: Uvula midline. No oropharyngeal exudate or posterior oropharyngeal erythema.     Tonsils: No tonsillar abscesses.  Eyes:     General: Lids are normal. Lids are everted, no foreign bodies appreciated.     Conjunctiva/sclera: Conjunctivae normal.     Pupils: Pupils are equal, round, and reactive to light.     Comments: Normal fundus bilaterally   Cardiovascular:     Rate and Rhythm: Normal rate and regular rhythm.     Pulses: Normal pulses.     Heart sounds: Normal heart sounds.  Pulmonary:     Effort: Pulmonary effort is normal.     Breath sounds: Normal breath sounds. No wheezing, rhonchi or rales.  Lymphadenopathy:     Head:     Right side of head: No submental, submandibular, tonsillar, preauricular, posterior auricular or occipital adenopathy.     Left side of head: No submental, submandibular, tonsillar, preauricular, posterior auricular or occipital adenopathy.     Cervical: No cervical adenopathy.     Right cervical: No superficial, deep or posterior cervical adenopathy.    Left cervical: No superficial, deep or posterior cervical adenopathy.  Skin:    General: Skin is warm and dry.  Neurological:     Mental Status: She is alert.     Cranial Nerves: No cranial nerve deficit.     Sensory: No sensory deficit.     Deep Tendon Reflexes:     Reflex Scores:      Bicep reflexes are 2+ on the right side and 2+ on the left side.      Patellar reflexes are 2+ on the right side and 2+ on the left side.    Comments: Grip equal and strong bilateral upper extremities. Gait strong and steady. Able to perform  finger-to-nose without difficulty.   Psychiatric:        Speech: Speech normal.        Behavior: Behavior normal.        Thought Content: Thought content normal.        Assessment & Plan:   Problem List Items Addressed This Visit       Nervous and Auditory   Otitis media - Primary    Afebrile.  Very reassuring neurologic  exam.  Bilateral tympanic membranes erythematous, bulging.  Long history of allergies.  Discussed at length vertigo and the distinction between dizziness and vertigo. Episodes she  describes align more with vertigo.   We also discussed vertigo and dizziness which can be a symptom more worrisome diagnosis including  CVA and when to present to ED/call 911.  History and physical exam  reassuring today and c/w  otitis media.  Opted to treat her with Augmentin which she has taken in the past.  She will continue probiotics.  Provided Elocon for itching in the ear.  She will let me know how she is doing      Relevant Medications   amoxicillin-clavulanate (AUGMENTIN) 875-125 MG tablet   mometasone (ELOCON) 0.1 % cream      I am having Lori Guzman start on amoxicillin-clavulanate and mometasone. I am also having her maintain her aspirin EC, mometasone, amLODipine, betamethasone dipropionate, pravastatin, telmisartan, and escitalopram.   Meds ordered this encounter  Medications   amoxicillin-clavulanate (AUGMENTIN) 875-125 MG tablet    Sig: Take 1 tablet by mouth 2 (two) times daily for 7 days.    Dispense:  14 tablet    Refill:  0    Order Specific Question:   Supervising Provider    Answer:   Deborra Medina L [[id number]]   mometasone (ELOCON) 0.1 % cream    Sig: Apply 1 Application topically daily as needed. Appear pea sized ( or less) amount to external ear, and slightly in canal daily as needed for itching    Dispense:  15 g    Refill:  1    Order Specific Question:   Supervising Provider    Answer:   Crecencio Mc [[id number]]    Return precautions given.   Risks, benefits, and alternatives of the medications and treatment plan prescribed today were discussed, and patient expressed understanding.   Education regarding symptom management and diagnosis given to patient on AVS.  Continue to follow with Einar Pheasant, MD for routine health maintenance.   Lori Guzman and I agreed  with plan.   Lori Paris, FNP

## 2021-10-19 ENCOUNTER — Telehealth: Payer: Self-pay | Admitting: Internal Medicine

## 2021-10-19 DIAGNOSIS — E7800 Pure hypercholesterolemia, unspecified: Secondary | ICD-10-CM

## 2021-10-19 DIAGNOSIS — I10 Essential (primary) hypertension: Secondary | ICD-10-CM

## 2021-10-19 DIAGNOSIS — E1165 Type 2 diabetes mellitus with hyperglycemia: Secondary | ICD-10-CM

## 2021-10-19 NOTE — Telephone Encounter (Signed)
Patient has a lab appt 10/25/21, there are no orders in.

## 2021-10-20 NOTE — Addendum Note (Signed)
Addended by: Alisa Graff on: 10/20/2021 03:52 AM   Modules accepted: Orders

## 2021-10-20 NOTE — Telephone Encounter (Signed)
Orders placed for labs

## 2021-10-25 ENCOUNTER — Other Ambulatory Visit (INDEPENDENT_AMBULATORY_CARE_PROVIDER_SITE_OTHER): Payer: Medicare PPO

## 2021-10-25 DIAGNOSIS — E1165 Type 2 diabetes mellitus with hyperglycemia: Secondary | ICD-10-CM

## 2021-10-25 DIAGNOSIS — E7800 Pure hypercholesterolemia, unspecified: Secondary | ICD-10-CM | POA: Diagnosis not present

## 2021-10-25 DIAGNOSIS — I10 Essential (primary) hypertension: Secondary | ICD-10-CM

## 2021-10-25 LAB — HEPATIC FUNCTION PANEL
ALT: 15 U/L (ref 0–35)
AST: 15 U/L (ref 0–37)
Albumin: 4.1 g/dL (ref 3.5–5.2)
Alkaline Phosphatase: 56 U/L (ref 39–117)
Bilirubin, Direct: 0.2 mg/dL (ref 0.0–0.3)
Total Bilirubin: 1.3 mg/dL — ABNORMAL HIGH (ref 0.2–1.2)
Total Protein: 6.5 g/dL (ref 6.0–8.3)

## 2021-10-25 LAB — BASIC METABOLIC PANEL
BUN: 22 mg/dL (ref 6–23)
CO2: 27 mEq/L (ref 19–32)
Calcium: 9.3 mg/dL (ref 8.4–10.5)
Chloride: 105 mEq/L (ref 96–112)
Creatinine, Ser: 0.83 mg/dL (ref 0.40–1.20)
GFR: 66.63 mL/min (ref >60.00)
Glucose, Bld: 120 mg/dL — ABNORMAL HIGH (ref 70–99)
Potassium: 4.3 mEq/L (ref 3.5–5.1)
Sodium: 141 mEq/L (ref 135–145)

## 2021-10-25 LAB — LIPID PANEL
Cholesterol: 170 mg/dL (ref 0–200)
HDL: 58.8 mg/dL (ref >39.00)
LDL Cholesterol: 101 mg/dL — ABNORMAL HIGH (ref 0–99)
NonHDL: 111.32
Total CHOL/HDL Ratio: 3
Triglycerides: 54 mg/dL (ref 0.0–149.0)
VLDL: 10.8 mg/dL (ref 0.0–40.0)

## 2021-10-25 LAB — HEMOGLOBIN A1C: Hgb A1c MFr Bld: 6.6 % — ABNORMAL HIGH (ref 4.6–6.5)

## 2021-10-30 ENCOUNTER — Encounter: Payer: Self-pay | Admitting: Internal Medicine

## 2021-10-30 ENCOUNTER — Ambulatory Visit: Payer: Medicare PPO | Admitting: Internal Medicine

## 2021-10-30 VITALS — BP 130/70 | HR 65 | Temp 98.3°F | Resp 16 | Ht 67.0 in | Wt 184.2 lb

## 2021-10-30 DIAGNOSIS — F439 Reaction to severe stress, unspecified: Secondary | ICD-10-CM

## 2021-10-30 DIAGNOSIS — Z23 Encounter for immunization: Secondary | ICD-10-CM | POA: Diagnosis not present

## 2021-10-30 DIAGNOSIS — E1165 Type 2 diabetes mellitus with hyperglycemia: Secondary | ICD-10-CM | POA: Diagnosis not present

## 2021-10-30 DIAGNOSIS — R42 Dizziness and giddiness: Secondary | ICD-10-CM

## 2021-10-30 DIAGNOSIS — Z86010 Personal history of colonic polyps: Secondary | ICD-10-CM

## 2021-10-30 DIAGNOSIS — I10 Essential (primary) hypertension: Secondary | ICD-10-CM

## 2021-10-30 DIAGNOSIS — L299 Pruritus, unspecified: Secondary | ICD-10-CM | POA: Diagnosis not present

## 2021-10-30 DIAGNOSIS — E7800 Pure hypercholesterolemia, unspecified: Secondary | ICD-10-CM

## 2021-10-30 NOTE — Patient Instructions (Signed)
Selsun shampoo    Zyrtec '10mg'$  - one per day as needed.   Continue nasonex

## 2021-10-30 NOTE — Progress Notes (Signed)
Patient ID: Lori Guzman, female   DOB: 1940-10-23, 81 y.o.   MRN: [id number]   Subjective:    Patient ID: Lori Guzman, female    DOB: [birth year], 81 y.o.   MRN: [id number]   Patient here for  Chief Complaint  Patient presents with   Follow-up    4 month f/u and f/u from vertigo visit    .   HPI Here to follow up regarding her blood pressure and reflux.  Evaluated 10/13/21 - diagnosed with Otitis media.  Treated with augmentin. Does feel some better.  Still with drainage in am.  Still with dizziness.  Has been doing modified epley maneuvers.  Has helped the side to side symptoms.  Did notice some dizziness two days ago - looked up after had been looking down.  Left ear still feels stopped up.  Using nasonex.  Blood pressure doing well - 128-130/65-70.  Reports dermatitis on scalp.  Some rash.  Itching.  Using betamethasone.  On lexapro.  Overall appears to be handling things well.   Past Medical History:  Diagnosis Date   Allergy    reoccuring problems   GERD (gastroesophageal reflux disease)    History of colon polyps    adenomatous   Hypertension    Past Surgical History:  Procedure Laterality Date   BARTHOLIN GLAND CYST EXCISION     CHOLECYSTECTOMY     NASAL POLYP SURGERY  1964   TONSILECTOMY/ADENOIDECTOMY WITH MYRINGOTOMY  1950   Family History  Problem Relation Age of Onset   Heart disease Mother    CVA Mother    Hypertension Mother    Alzheimer's disease Mother    Esophageal cancer Father    Breast cancer Neg Hx    Colon cancer Neg Hx    Social History   Socioeconomic History   Marital status: Widowed    Spouse name: Not on file   Number of children: Not on file   Years of education: Not on file   Highest education level: Not on file  Occupational History   Not on file  Tobacco Use   Smoking status: Never   Smokeless tobacco: Never  Substance and Sexual Activity   Alcohol use: No    Alcohol/week: 0.0 standard drinks of alcohol   Drug use: No    Sexual activity: Not on file  Other Topics Concern   Not on file  Social History Narrative   She is married, has 3 children and is retired.   Social Determinants of Health   Financial Resource Strain: Low Risk  (03/31/2021)   Overall Financial Resource Strain (CARDIA)    Difficulty of Paying Living Expenses: Not hard at all  Food Insecurity: No Food Insecurity (03/31/2021)   Hunger Vital Sign    Worried About Running Out of Food in the Last Year: Never true    Ran Out of Food in the Last Year: Never true  Transportation Needs: No Transportation Needs (03/31/2021)   PRAPARE - Hydrologist (Medical): No    Lack of Transportation (Non-Medical): No  Physical Activity: Sufficiently Active (03/31/2021)   Exercise Vital Sign    Days of Exercise per Week: 3 days    Minutes of Exercise per Session: 50 min  Stress: No Stress Concern Present (03/31/2021)   [place]    Feeling of Stress : Not at all  Social Connections: Moderately Integrated (03/31/2021)   Social Connection and Isolation  Panel [NHANES]    Frequency of Communication with Friends and Family: More than three times a week    Frequency of Social Gatherings with Friends and Family: More than three times a week    Attends Religious Services: 1 to 4 times per year    Active Member of [employer] or Organizations: Yes    Attends Archivist Meetings: Not on file    Marital Status: Widowed     Review of Systems  Constitutional:  Negative for appetite change and unexpected weight change.  HENT:  Negative for congestion and sinus pressure.        Ear fullness as outlined.   Respiratory:  Negative for cough, chest tightness and shortness of breath.   Cardiovascular:  Negative for chest pain, palpitations and leg swelling.  Gastrointestinal:  Negative for diarrhea, nausea and vomiting.  Genitourinary:  Negative for difficulty urinating  and dysuria.  Musculoskeletal:  Negative for joint swelling and myalgias.  Skin:  Negative for color change and rash.  Neurological:  Negative for dizziness, light-headedness and headaches.  Psychiatric/Behavioral:  Negative for agitation and dysphoric mood.        Objective:     BP 130/70 Comment: pts check on outside readings.  Pulse 65   Temp 98.3 F (36.8 C) (Oral)   Resp 16   Ht _0  (1.702 m)   Wt 184 lb 3.2 oz (83.6 kg)   LMP 02/29/1992   SpO2 98%   BMI 28.85 kg/m  Wt Readings from Last 3 Encounters:  10/30/21 184 lb 3.2 oz (83.6 kg)  10/13/21 184 lb 6.4 oz (83.6 kg)  06/29/21 184 lb (83.5 kg)    Physical Exam Vitals reviewed.  Constitutional:      General: She is not in acute distress.    Appearance: Normal appearance.  HENT:     Head: Normocephalic and atraumatic.     Right Ear: External ear normal.     Left Ear: External ear normal.     Ears:     Comments: No erythema - TM.  No cerumen impaction.  Eyes:     General: No scleral icterus.       Right eye: No discharge.        Left eye: No discharge.     Conjunctiva/sclera: Conjunctivae normal.  Neck:     Thyroid: No thyromegaly.  Cardiovascular:     Rate and Rhythm: Normal rate and regular rhythm.  Pulmonary:     Effort: No respiratory distress.     Breath sounds: Normal breath sounds. No wheezing.  Abdominal:     General: Bowel sounds are normal.     Palpations: Abdomen is soft.     Tenderness: There is no abdominal tenderness.  Musculoskeletal:        General: No swelling or tenderness.     Cervical back: Neck supple. No tenderness.  Lymphadenopathy:     Cervical: No cervical adenopathy.  Skin:    Findings: No erythema or rash.  Neurological:     Mental Status: She is alert.  Psychiatric:        Mood and Affect: Mood normal.        Behavior: Behavior normal.      Outpatient Encounter Medications as of 10/30/2021  Medication Sig   amLODipine (NORVASC) 5 MG tablet TAKE 1 TABLET (5 MG  TOTAL) BY MOUTH DAILY.   aspirin EC 81 MG tablet Take 81 mg by mouth daily.   betamethasone dipropionate 0.05 % lotion Apply  topically 2 (two) times daily.   escitalopram (LEXAPRO) 20 MG tablet TAKE 1 TABLET BY MOUTH EVERY DAY   mometasone (ELOCON) 0.1 % cream Apply 1 Application topically daily as needed. Appear pea sized ( or less) amount to external ear, and slightly in canal daily as needed for itching   mometasone (NASONEX) 50 MCG/ACT nasal spray Place into the nose.   pravastatin (PRAVACHOL) 10 MG tablet Take one tablet q Monday, Wednesday and Friday   telmisartan (MICARDIS) 80 MG tablet TAKE 1 TABLET BY MOUTH EVERY DAY   No facility-administered encounter medications on file as of 10/30/2021.     Lab Results  Component Value Date   WBC 7.0 03/16/2021   HGB 12.7 03/16/2021   HCT 38.4 03/16/2021   PLT 242.0 03/16/2021   GLUCOSE 120 (H) 10/25/2021   CHOL 170 10/25/2021   TRIG 54.0 10/25/2021   HDL 58.80 10/25/2021   LDLCALC 101 (H) 10/25/2021   ALT 15 10/25/2021   AST 15 10/25/2021   NA 141 10/25/2021   K 4.3 10/25/2021   CL 105 10/25/2021   CREATININE 0.83 10/25/2021   BUN 22 10/25/2021   CO2 27 10/25/2021   TSH 2.35 03/16/2021   HGBA1C 6.6 (H) 10/25/2021   MICROALBUR 2.9 (H) 06/22/2021    MM 3D SCREEN BREAST BILATERAL  Result Date: 04/24/2021 CLINICAL DATA:  Screening. EXAM: DIGITAL SCREENING BILATERAL MAMMOGRAM WITH TOMOSYNTHESIS AND CAD TECHNIQUE: Bilateral screening digital craniocaudal and mediolateral oblique mammograms were obtained. Bilateral screening digital breast tomosynthesis was performed. The images were evaluated with computer-aided detection. COMPARISON:  Previous exam(s). ACR Breast Density Category b: There are scattered areas of fibroglandular density. FINDINGS: There are no findings suspicious for malignancy. IMPRESSION: No mammographic evidence of malignancy. A result letter of this screening mammogram will be mailed directly to the patient.  RECOMMENDATION: Screening mammogram in one year. (Code:SM-B-01Y) BI-RADS CATEGORY  1: Negative. Electronically Signed   By: Zerita Boers M.D.   On: 04/24/2021 16:58       Assessment & Plan:   Problem List Items Addressed This Visit     Diabetes ([place])    Low carb diet and exercise given elevated blood sugars.  On no medication.   Follow met b and a1c.   Lab Results  Component Value Date   HGBA1C 6.6 (H) 10/25/2021       Dizziness    Has seen Dr Kathyrn Sheriff.  Recently saw Joycelyn Schmid.  Is better.  Continue nasonex.  Zyrtec.  Call with update.  Follow.        History of colonic polyps    Last colonoscopy 2016.  Recommended f/u in 5 years.  Per pt, per GI no f/u colonoscopy warranted.        Hypercholesterolemia     Discussed calculated cholesterol risk.  Discussed starting low dose pravastatin.  Agreeable to pravastatin 31m qod. Follow lipid panel and liver function tests.       Hypertension    Blood pressure on outside checks better.  Continue micardis and amlodipine.  Follow pressures.  Follow metabolic panel.       Itching    Itching - scalp.  Selsun shampoo. Notify if persistent.       Stress    On lexapro 264m  Appears to be doing relatively well.  Continue current medication.        Other Visit Diagnoses     Need for immunization against influenza    -  Primary   Relevant Orders  Flu Vaccine QUAD High Dose(Fluad) (Completed)        Einar Pheasant, MD

## 2021-11-05 ENCOUNTER — Encounter: Payer: Self-pay | Admitting: Internal Medicine

## 2021-11-05 DIAGNOSIS — L299 Pruritus, unspecified: Secondary | ICD-10-CM | POA: Insufficient documentation

## 2021-11-05 NOTE — Assessment & Plan Note (Signed)
  Discussed calculated cholesterol risk.  Discussed starting low dose pravastatin.  Agreeable to pravastatin '10mg'$  qod. Follow lipid panel and liver function tests.

## 2021-11-05 NOTE — Assessment & Plan Note (Signed)
Blood pressure on outside checks better.  Continue micardis and amlodipine.  Follow pressures.  Follow metabolic panel.  

## 2021-11-05 NOTE — Assessment & Plan Note (Signed)
Itching - scalp.  Selsun shampoo. Notify if persistent.

## 2021-11-05 NOTE — Assessment & Plan Note (Signed)
On lexapro 20mg.  Appears to be doing relatively well.  Continue current medication.   

## 2021-11-05 NOTE — Assessment & Plan Note (Signed)
Low carb diet and exercise given elevated blood sugars.  On no medication.   Follow met b and a1c.   Lab Results  Component Value Date   HGBA1C 6.6 (H) 10/25/2021

## 2021-11-05 NOTE — Assessment & Plan Note (Signed)
Has seen Dr Kathyrn Sheriff.  Recently saw Joycelyn Schmid.  Is better.  Continue nasonex.  Zyrtec.  Call with update.  Follow.

## 2021-11-05 NOTE — Assessment & Plan Note (Signed)
Last colonoscopy 2016.  Recommended f/u in 5 years.  Per pt, per GI no f/u colonoscopy warranted.   

## 2021-11-23 ENCOUNTER — Telehealth: Payer: Self-pay | Admitting: Internal Medicine

## 2021-11-23 DIAGNOSIS — R42 Dizziness and giddiness: Secondary | ICD-10-CM

## 2021-11-23 NOTE — Telephone Encounter (Signed)
Order placed for ENT referral.   

## 2021-11-23 NOTE — Telephone Encounter (Addendum)
Patient stated at her last visit the provider informed her that someone would be calling her in reference to an ENT appointment . I do not see a referral. She is still having vertigo.

## 2021-11-29 DIAGNOSIS — L2089 Other atopic dermatitis: Secondary | ICD-10-CM | POA: Diagnosis not present

## 2021-11-29 DIAGNOSIS — L738 Other specified follicular disorders: Secondary | ICD-10-CM | POA: Diagnosis not present

## 2021-12-05 DIAGNOSIS — H8111 Benign paroxysmal vertigo, right ear: Secondary | ICD-10-CM | POA: Diagnosis not present

## 2021-12-12 DIAGNOSIS — H8111 Benign paroxysmal vertigo, right ear: Secondary | ICD-10-CM | POA: Diagnosis not present

## 2021-12-15 NOTE — Progress Notes (Signed)
Pt did not have any more symptoms of vertigo

## 2021-12-27 ENCOUNTER — Telehealth: Payer: Self-pay | Admitting: Internal Medicine

## 2021-12-27 NOTE — Telephone Encounter (Signed)
Pt called stating she has congestion in her chest and she would like to see her provider. I offered the pt a virtual visit and in office with another provider at another location. Pt stated she did not know where [place] was and for the virtual visit she did not know how to get on mychart

## 2021-12-27 NOTE — Telephone Encounter (Signed)
LM for pt to cb re : [address] on Friday open

## 2021-12-28 ENCOUNTER — Ambulatory Visit: Payer: Medicare PPO | Admitting: Internal Medicine

## 2021-12-28 ENCOUNTER — Other Ambulatory Visit: Payer: Self-pay | Admitting: Internal Medicine

## 2021-12-28 NOTE — Telephone Encounter (Signed)
PT SCHED W/ [facility]

## 2021-12-29 ENCOUNTER — Ambulatory Visit: Payer: Medicare PPO | Admitting: Family Medicine

## 2021-12-29 ENCOUNTER — Encounter: Payer: Self-pay | Admitting: Family Medicine

## 2021-12-29 ENCOUNTER — Ambulatory Visit (INDEPENDENT_AMBULATORY_CARE_PROVIDER_SITE_OTHER): Payer: Medicare PPO

## 2021-12-29 VITALS — BP 160/70 | HR 80 | Temp 98.5°F | Wt 183.8 lb

## 2021-12-29 DIAGNOSIS — J40 Bronchitis, not specified as acute or chronic: Secondary | ICD-10-CM

## 2021-12-29 DIAGNOSIS — R0989 Other specified symptoms and signs involving the circulatory and respiratory systems: Secondary | ICD-10-CM | POA: Diagnosis not present

## 2021-12-29 DIAGNOSIS — I10 Essential (primary) hypertension: Secondary | ICD-10-CM | POA: Diagnosis not present

## 2021-12-29 DIAGNOSIS — J42 Unspecified chronic bronchitis: Secondary | ICD-10-CM | POA: Insufficient documentation

## 2021-12-29 DIAGNOSIS — R059 Cough, unspecified: Secondary | ICD-10-CM | POA: Diagnosis not present

## 2021-12-29 MED ORDER — AZITHROMYCIN 250 MG PO TABS: ORAL_TABLET | ORAL | 0 refills | Status: AC

## 2021-12-29 NOTE — Assessment & Plan Note (Signed)
Patient reports blood pressure seems to be better controlled at home.  She will keep track of her pressures and follow-up with her PCP on this.  She will continue telmisartan 80 mg daily and amlodipine 5 mg daily.

## 2021-12-29 NOTE — Progress Notes (Signed)
Tommi Rumps, MD Phone: [phone]  Lori Guzman is a 81 y.o. female who presents today for same-day visit.  Respiratory infection: Patient notes onset of symptoms 9 days ago.  Started with a scratchy throat and then developed chest congestion and tightness with cough productive of clear mucus.  She notes no sinus congestion.  No fevers.  She has some shortness of breath with coughing.  She had some wheezing last week.  No sore throat.  She tried Zyrtec.  Social History   Tobacco Use  Smoking Status Never  Smokeless Tobacco Never    Current Outpatient Medications on File Prior to Visit  Medication Sig Dispense Refill   amLODipine (NORVASC) 5 MG tablet TAKE 1 TABLET (5 MG TOTAL) BY MOUTH DAILY. 90 tablet 1   aspirin EC 81 MG tablet Take 81 mg by mouth daily.     betamethasone dipropionate 0.05 % lotion Apply topically 2 (two) times daily.     escitalopram (LEXAPRO) 20 MG tablet TAKE 1 TABLET BY MOUTH EVERY DAY 90 tablet 1   mometasone (ELOCON) 0.1 % cream Apply 1 Application topically daily as needed. Appear pea sized ( or less) amount to external ear, and slightly in canal daily as needed for itching 15 g 1   mometasone (NASONEX) 50 MCG/ACT nasal spray Place into the nose.     pravastatin (PRAVACHOL) 10 MG tablet TAKE ONE TABLET EVERY MONDAY, WEDNESDAY AND FRIDAY 39 tablet 1   telmisartan (MICARDIS) 80 MG tablet TAKE 1 TABLET BY MOUTH EVERY DAY 90 tablet 1   No current facility-administered medications on file prior to visit.     ROS see history of present illness  Objective  Physical Exam Vitals:   12/29/21 1531 12/29/21 1532  BP: (!) 146/76 (!) 160/70  Pulse: 80   Temp: 98.5 F (36.9 C)   SpO2: 98%     BP Readings from Last 3 Encounters:  12/29/21 (!) 160/70  10/30/21 130/70  10/13/21 124/76   Wt Readings from Last 3 Encounters:  12/29/21 183 lb 12.8 oz (83.4 kg)  10/30/21 184 lb 3.2 oz (83.6 kg)  10/13/21 184 lb 6.4 oz (83.6 kg)    Physical  Exam Constitutional:      General: She is not in acute distress.    Appearance: She is not diaphoretic.  Cardiovascular:     Rate and Rhythm: Normal rate and regular rhythm.     Heart sounds: Normal heart sounds.  Pulmonary:     Effort: Pulmonary effort is normal. No respiratory distress.     Breath sounds: No wheezing.     Comments: Crackles bilateral bases L>R Skin:    General: Skin is warm and dry.  Neurological:     Mental Status: She is alert.      Assessment/Plan: Please see individual problem list.  Problem List Items Addressed This Visit     Bronchitis - Primary    Symptoms are likely related to bronchitis though given crackles on exam we will get a chest x-ray to evaluate for pneumonia.  I am going to start her on azithromycin given the duration of her symptoms.  If her x-ray reveals pneumonia we will add Augmentin.  She will seek medical attention for cough productive of blood, shortness of breath, chest pain, or fevers.      Relevant Medications   azithromycin (ZITHROMAX) 250 MG tablet   Other Relevant Orders   DG Chest 2 View   Hypertension    Patient reports blood pressure seems  to be better controlled at home.  She will keep track of her pressures and follow-up with her PCP on this.  She will continue telmisartan 80 mg daily and amlodipine 5 mg daily.       Return if symptoms worsen or fail to improve.   Tommi Rumps, MD [place]

## 2021-12-29 NOTE — Assessment & Plan Note (Signed)
Symptoms are likely related to bronchitis though given crackles on exam we will get a chest x-ray to evaluate for pneumonia.  I am going to start her on azithromycin given the duration of her symptoms.  If her x-ray reveals pneumonia we will add Augmentin.  She will seek medical attention for cough productive of blood, shortness of breath, chest pain, or fevers.

## 2021-12-29 NOTE — Patient Instructions (Signed)
Nice to see. We will check a chest x-ray today and contact her with results. I sent in azithromycin for you to take for the likely bronchitis.  If your symptoms are not improving by next week please let us know. If you develop cough productive of blood, shortness of breath, chest pain, or fevers please seek medical attention.

## 2021-12-31 ENCOUNTER — Encounter: Payer: Self-pay | Admitting: Internal Medicine

## 2022-01-01 ENCOUNTER — Other Ambulatory Visit: Payer: Self-pay | Admitting: Family Medicine

## 2022-01-01 DIAGNOSIS — J189 Pneumonia, unspecified organism: Secondary | ICD-10-CM

## 2022-01-01 MED ORDER — AMOXICILLIN-POT CLAVULANATE 875-125 MG PO TABS: 1.0000 | ORAL_TABLET | Freq: Two times a day (BID) | ORAL | 0 refills | Status: DC

## 2022-01-01 NOTE — Telephone Encounter (Signed)
Reviewed.  Saw Dr Caryl Bis.  Per his cxr result note, pt was notified of cxr findings and augmentin was added to her medication regimen.  She will need a f/u cxr in 4-6 weeks to confirm cxr clears.  Keep [place] posted on how doing.

## 2022-01-11 ENCOUNTER — Encounter: Payer: Self-pay | Admitting: Internal Medicine

## 2022-01-11 ENCOUNTER — Ambulatory Visit: Payer: Medicare PPO | Admitting: Internal Medicine

## 2022-01-11 VITALS — BP 126/70 | HR 71 | Temp 97.9°F | Resp 17 | Ht 67.0 in | Wt 184.0 lb

## 2022-01-11 DIAGNOSIS — E7800 Pure hypercholesterolemia, unspecified: Secondary | ICD-10-CM

## 2022-01-11 DIAGNOSIS — E1165 Type 2 diabetes mellitus with hyperglycemia: Secondary | ICD-10-CM

## 2022-01-11 DIAGNOSIS — I10 Essential (primary) hypertension: Secondary | ICD-10-CM

## 2022-01-11 DIAGNOSIS — J189 Pneumonia, unspecified organism: Secondary | ICD-10-CM | POA: Diagnosis not present

## 2022-01-11 MED ORDER — PREDNISONE 10 MG PO TABS: ORAL_TABLET | ORAL | 0 refills | Status: DC

## 2022-01-11 NOTE — Progress Notes (Signed)
Patient ID: Lori Guzman, female   DOB: [birth year], 81 y.o.   MRN: [id number]   Subjective:    Patient ID: Lori Guzman, female    DOB: Oct 11, 1940, 81 y.o.   MRN: [id number]   Patient here for  Chief Complaint  Patient presents with   Follow-up   Cough   .   HPI Here to follow up regarding her blood pressure .  Also recently diagnosed with pneumonia.  Saw Dr Caryl Bis.  Treated with augmentin and zpak.  She completed Tuesday. Scheduled for f/u cxr in 01/2022.  Still with increased cough.  No chest pain.  No increased sob.  No nausea or vomiting.  No abdominal pain or bowel change reported.  Reviewed outside blood pressure checks - averaging 120-130s/50-70s.     Past Medical History:  Diagnosis Date   Allergy    reoccuring problems   GERD (gastroesophageal reflux disease)    History of colon polyps    adenomatous   Hypertension    Past Surgical History:  Procedure Laterality Date   BARTHOLIN GLAND CYST EXCISION     CHOLECYSTECTOMY     NASAL POLYP SURGERY  1964   TONSILECTOMY/ADENOIDECTOMY WITH MYRINGOTOMY  1950   Family History  Problem Relation Age of Onset   Heart disease Mother    CVA Mother    Hypertension Mother    Alzheimer's disease Mother    Esophageal cancer Father    Breast cancer Neg Hx    Colon cancer Neg Hx    Social History   Socioeconomic History   Marital status: Widowed    Spouse name: Not on file   Number of children: Not on file   Years of education: Not on file   Highest education level: Not on file  Occupational History   Not on file  Tobacco Use   Smoking status: Never   Smokeless tobacco: Never  Substance and Sexual Activity   Alcohol use: No    Alcohol/week: 0.0 standard drinks of alcohol   Drug use: No   Sexual activity: Not on file  Other Topics Concern   Not on file  Social History Narrative   She is married, has 3 children and is retired.   Social Determinants of Health   Financial Resource Strain: Low Risk   (03/31/2021)   Overall Financial Resource Strain (CARDIA)    Difficulty of Paying Living Expenses: Not hard at all  Food Insecurity: No Food Insecurity (03/31/2021)   Hunger Vital Sign    Worried About Running Out of Food in the Last Year: Never true    Ran Out of Food in the Last Year: Never true  Transportation Needs: No Transportation Needs (03/31/2021)   PRAPARE - Hydrologist (Medical): No    Lack of Transportation (Non-Medical): No  Physical Activity: Sufficiently Active (03/31/2021)   Exercise Vital Sign    Days of Exercise per Week: 3 days    Minutes of Exercise per Session: 50 min  Stress: No Stress Concern Present (03/31/2021)   [place]    Feeling of Stress : Not at all  Social Connections: Moderately Integrated (03/31/2021)   Social Connection and Isolation Panel [NHANES]    Frequency of Communication with Friends and Family: More than three times a week    Frequency of Social Gatherings with Friends and Family: More than three times a week    Attends Religious Services: 1 to 4 times  per year    Active Member of Clubs or Organizations: Yes    Attends Archivist Meetings: Not on file    Marital Status: Widowed     Review of Systems  Constitutional:  Negative for appetite change and unexpected weight change.  HENT:  Positive for congestion. Negative for sinus pressure.   Respiratory:  Positive for cough. Negative for chest tightness and shortness of breath.   Cardiovascular:  Negative for chest pain and palpitations.  Gastrointestinal:  Negative for abdominal pain, diarrhea, nausea and vomiting.  Genitourinary:  Negative for difficulty urinating and dysuria.  Musculoskeletal:  Negative for joint swelling and myalgias.  Skin:  Negative for color change and rash.  Neurological:  Negative for dizziness, light-headedness and headaches.  Psychiatric/Behavioral:  Negative for  agitation and dysphoric mood.        Objective:     BP 126/70   Pulse 71   Temp 97.9 F (36.6 C) (Temporal)   Resp 17   Ht _0  (1.702 m)   Wt 184 lb (83.5 kg)   LMP 02/29/1992   SpO2 98%   BMI 28.82 kg/m  Wt Readings from Last 3 Encounters:  01/11/22 184 lb (83.5 kg)  12/29/21 183 lb 12.8 oz (83.4 kg)  10/30/21 184 lb 3.2 oz (83.6 kg)    Physical Exam Vitals reviewed.  Constitutional:      General: She is not in acute distress.    Appearance: Normal appearance.  HENT:     Head: Normocephalic and atraumatic.     Right Ear: External ear normal.     Left Ear: External ear normal.  Eyes:     General: No scleral icterus.       Right eye: No discharge.        Left eye: No discharge.     Conjunctiva/sclera: Conjunctivae normal.  Neck:     Thyroid: No thyromegaly.  Cardiovascular:     Rate and Rhythm: Normal rate and regular rhythm.  Pulmonary:     Effort: No respiratory distress.     Breath sounds: Normal breath sounds. No wheezing.     Comments: Increased cough with forced expiration.  Abdominal:     General: Bowel sounds are normal.     Palpations: Abdomen is soft.     Tenderness: There is no abdominal tenderness.  Musculoskeletal:        General: No swelling or tenderness.     Cervical back: Neck supple. No tenderness.  Lymphadenopathy:     Cervical: No cervical adenopathy.  Skin:    Findings: No erythema or rash.  Neurological:     Mental Status: She is alert.  Psychiatric:        Mood and Affect: Mood normal.        Behavior: Behavior normal.      Outpatient Encounter Medications as of 01/11/2022  Medication Sig   amLODipine (NORVASC) 5 MG tablet TAKE 1 TABLET (5 MG TOTAL) BY MOUTH DAILY.   aspirin EC 81 MG tablet Take 81 mg by mouth daily.   escitalopram (LEXAPRO) 20 MG tablet TAKE 1 TABLET BY MOUTH EVERY DAY   mometasone (ELOCON) 0.1 % cream Apply 1 Application topically daily as needed. Appear pea sized ( or less) amount to external ear, and  slightly in canal daily as needed for itching   pravastatin (PRAVACHOL) 10 MG tablet TAKE ONE TABLET EVERY MONDAY, WEDNESDAY AND FRIDAY   predniSONE (DELTASONE) 10 MG tablet Take 4 tablets x 1 day and  then decrease by 1/2 tablet per day until down to zero mg.   telmisartan (MICARDIS) 80 MG tablet TAKE 1 TABLET BY MOUTH EVERY DAY   [DISCONTINUED] amoxicillin-clavulanate (AUGMENTIN) 875-125 MG tablet Take 1 tablet by mouth 2 (two) times daily.   [DISCONTINUED] betamethasone dipropionate 0.05 % lotion Apply topically 2 (two) times daily.   [DISCONTINUED] mometasone (NASONEX) 50 MCG/ACT nasal spray Place into the nose.   No facility-administered encounter medications on file as of 01/11/2022.     Lab Results  Component Value Date   WBC 7.0 03/16/2021   HGB 12.7 03/16/2021   HCT 38.4 03/16/2021   PLT 242.0 03/16/2021   GLUCOSE 120 (H) 10/25/2021   CHOL 170 10/25/2021   TRIG 54.0 10/25/2021   HDL 58.80 10/25/2021   LDLCALC 101 (H) 10/25/2021   ALT 15 10/25/2021   AST 15 10/25/2021   NA 141 10/25/2021   K 4.3 10/25/2021   CL 105 10/25/2021   CREATININE 0.83 10/25/2021   BUN 22 10/25/2021   CO2 27 10/25/2021   TSH 2.35 03/16/2021   HGBA1C 6.6 (H) 10/25/2021   MICROALBUR 2.9 (H) 06/22/2021    MM 3D SCREEN BREAST BILATERAL  Result Date: 04/24/2021 CLINICAL DATA:  Screening. EXAM: DIGITAL SCREENING BILATERAL MAMMOGRAM WITH TOMOSYNTHESIS AND CAD TECHNIQUE: Bilateral screening digital craniocaudal and mediolateral oblique mammograms were obtained. Bilateral screening digital breast tomosynthesis was performed. The images were evaluated with computer-aided detection. COMPARISON:  Previous exam(s). ACR Breast Density Category b: There are scattered areas of fibroglandular density. FINDINGS: There are no findings suspicious for malignancy. IMPRESSION: No mammographic evidence of malignancy. A result letter of this screening mammogram will be mailed directly to the patient. RECOMMENDATION:  Screening mammogram in one year. (Code:SM-B-01Y) BI-RADS CATEGORY  1: Negative. Electronically Signed   By: Zerita Boers M.D.   On: 04/24/2021 16:58       Assessment & Plan:   Problem List Items Addressed This Visit     Diabetes ([place]) - Primary    Low carb diet and exercise given elevated blood sugars.  On no medication.   Follow met b and a1c.   Lab Results  Component Value Date   HGBA1C 6.6 (H) 10/25/2021       Relevant Orders   Basic metabolic panel   Hemoglobin A1c   Hypercholesterolemia    Continue pravastatin. Follow lipid panel and liver function tests.       Relevant Orders   CBC with Differential/Platelet   Hepatic function panel   TSH   Lipid panel   Hypertension    Blood pressure on outside checks better.  Continue micardis and amlodipine.  Follow pressures.  Follow metabolic panel.       Pneumonia    Recently diagnosed with pneumonia.  Treated with augmentin and zpak.  Symptoms have improved.  Still with increased cough.  Discussed.  Nasal sprays.  Robitussin DM.  Prednisone taper as directed.  Follow.  Call with update.  Scheduled for f/u cxr.          Einar Pheasant, MD

## 2022-01-14 ENCOUNTER — Encounter: Payer: Self-pay | Admitting: Internal Medicine

## 2022-01-14 DIAGNOSIS — J189 Pneumonia, unspecified organism: Secondary | ICD-10-CM | POA: Insufficient documentation

## 2022-01-14 NOTE — Assessment & Plan Note (Signed)
Blood pressure on outside checks better.  Continue micardis and amlodipine.  Follow pressures.  Follow metabolic panel.

## 2022-01-14 NOTE — Assessment & Plan Note (Signed)
Recently diagnosed with pneumonia.  Treated with augmentin and zpak.  Symptoms have improved.  Still with increased cough.  Discussed.  Nasal sprays.  Robitussin DM.  Prednisone taper as directed.  Follow.  Call with update.  Scheduled for f/u cxr.

## 2022-01-14 NOTE — Assessment & Plan Note (Signed)
Continue pravastatin. Follow lipid panel and liver function tests.  

## 2022-01-14 NOTE — Assessment & Plan Note (Signed)
Low carb diet and exercise given elevated blood sugars.  On no medication.   Follow met b and a1c.   Lab Results  Component Value Date   HGBA1C 6.6 (H) 10/25/2021   

## 2022-01-17 ENCOUNTER — Other Ambulatory Visit: Payer: Self-pay | Admitting: Internal Medicine

## 2022-01-18 ENCOUNTER — Other Ambulatory Visit: Payer: Self-pay

## 2022-01-18 ENCOUNTER — Encounter: Payer: Self-pay | Admitting: Internal Medicine

## 2022-01-18 MED ORDER — NYSTATIN 100000 UNIT/GM EX CREA: 1.0000 | TOPICAL_CREAM | Freq: Two times a day (BID) | CUTANEOUS | 0 refills | Status: DC

## 2022-01-18 NOTE — Telephone Encounter (Signed)
Confirm nkda.  If no, then can try nystatin cream - apply topically bid.  If a persistent problem, let me know.

## 2022-01-18 NOTE — Telephone Encounter (Signed)
Pt advised Nystatin sent in to pharm

## 2022-01-29 ENCOUNTER — Other Ambulatory Visit: Payer: Medicare PPO

## 2022-01-29 ENCOUNTER — Ambulatory Visit (INDEPENDENT_AMBULATORY_CARE_PROVIDER_SITE_OTHER): Payer: Medicare PPO

## 2022-01-29 DIAGNOSIS — E7800 Pure hypercholesterolemia, unspecified: Secondary | ICD-10-CM

## 2022-01-29 DIAGNOSIS — I10 Essential (primary) hypertension: Secondary | ICD-10-CM

## 2022-01-29 DIAGNOSIS — E1165 Type 2 diabetes mellitus with hyperglycemia: Secondary | ICD-10-CM

## 2022-01-29 DIAGNOSIS — Z0000 Encounter for general adult medical examination without abnormal findings: Secondary | ICD-10-CM

## 2022-01-29 DIAGNOSIS — J189 Pneumonia, unspecified organism: Secondary | ICD-10-CM | POA: Diagnosis not present

## 2022-03-20 ENCOUNTER — Other Ambulatory Visit (INDEPENDENT_AMBULATORY_CARE_PROVIDER_SITE_OTHER): Payer: Medicare PPO

## 2022-03-20 DIAGNOSIS — E1165 Type 2 diabetes mellitus with hyperglycemia: Secondary | ICD-10-CM

## 2022-03-20 DIAGNOSIS — E7800 Pure hypercholesterolemia, unspecified: Secondary | ICD-10-CM | POA: Diagnosis not present

## 2022-03-20 DIAGNOSIS — I10 Essential (primary) hypertension: Secondary | ICD-10-CM

## 2022-03-20 DIAGNOSIS — Z0000 Encounter for general adult medical examination without abnormal findings: Secondary | ICD-10-CM | POA: Diagnosis not present

## 2022-03-20 LAB — HEPATIC FUNCTION PANEL
ALT: 17 U/L (ref 0–35)
AST: 17 U/L (ref 0–37)
Albumin: 4.5 g/dL (ref 3.5–5.2)
Alkaline Phosphatase: 60 U/L (ref 39–117)
Bilirubin, Direct: 0.2 mg/dL (ref 0.0–0.3)
Total Bilirubin: 1.3 mg/dL — ABNORMAL HIGH (ref 0.2–1.2)
Total Protein: 6.7 g/dL (ref 6.0–8.3)

## 2022-03-20 LAB — CBC WITH DIFFERENTIAL/PLATELET
Basophils Absolute: 0.1 10*3/uL (ref 0.0–0.1)
Basophils Relative: 1 % (ref 0.0–3.0)
Eosinophils Absolute: 0.2 10*3/uL (ref 0.0–0.7)
Eosinophils Relative: 2.7 % (ref 0.0–5.0)
HCT: 39.4 % (ref 36.0–46.0)
Hemoglobin: 13.5 g/dL (ref 12.0–15.0)
Lymphocytes Relative: 33.7 % (ref 12.0–46.0)
Lymphs Abs: 2 10*3/uL (ref 0.7–4.0)
MCHC: 34.3 g/dL (ref 30.0–36.0)
MCV: 92.3 fl (ref 78.0–100.0)
Monocytes Absolute: 0.5 10*3/uL (ref 0.1–1.0)
Monocytes Relative: 8.8 % (ref 3.0–12.0)
Neutro Abs: 3.1 10*3/uL (ref 1.4–7.7)
Neutrophils Relative %: 53.8 % (ref 43.0–77.0)
Platelets: 298 10*3/uL (ref 150.0–400.0)
RBC: 4.27 Mil/uL (ref 3.87–5.11)
RDW: 13.8 % (ref 11.5–15.5)
WBC: 5.8 10*3/uL (ref 4.0–10.5)

## 2022-03-20 LAB — HEMOGLOBIN A1C: Hgb A1c MFr Bld: 6.4 % (ref 4.6–6.5)

## 2022-03-20 LAB — LIPID PANEL
Cholesterol: 176 mg/dL (ref 0–200)
HDL: 56.9 mg/dL (ref >39.00)
LDL Cholesterol: 106 mg/dL — ABNORMAL HIGH (ref 0–99)
NonHDL: 118.73
Total CHOL/HDL Ratio: 3
Triglycerides: 66 mg/dL (ref 0.0–149.0)
VLDL: 13.2 mg/dL (ref 0.0–40.0)

## 2022-03-20 LAB — BASIC METABOLIC PANEL
BUN: 24 mg/dL — ABNORMAL HIGH (ref 6–23)
CO2: 28 mEq/L (ref 19–32)
Calcium: 9.5 mg/dL (ref 8.4–10.5)
Chloride: 103 mEq/L (ref 96–112)
Creatinine, Ser: 0.83 mg/dL (ref 0.40–1.20)
GFR: 66.45 mL/min (ref >60.00)
Glucose, Bld: 106 mg/dL — ABNORMAL HIGH (ref 70–99)
Potassium: 4.8 mEq/L (ref 3.5–5.1)
Sodium: 141 mEq/L (ref 135–145)

## 2022-03-20 LAB — TSH: TSH: 2.85 u[IU]/mL (ref 0.35–5.50)

## 2022-03-22 ENCOUNTER — Encounter: Payer: Self-pay | Admitting: Internal Medicine

## 2022-03-22 ENCOUNTER — Ambulatory Visit: Payer: Medicare PPO | Admitting: Internal Medicine

## 2022-03-22 VITALS — BP 132/70 | HR 77 | Temp 97.9°F | Resp 16 | Ht 67.0 in | Wt 176.0 lb

## 2022-03-22 DIAGNOSIS — E7800 Pure hypercholesterolemia, unspecified: Secondary | ICD-10-CM | POA: Diagnosis not present

## 2022-03-22 DIAGNOSIS — I10 Essential (primary) hypertension: Secondary | ICD-10-CM

## 2022-03-22 DIAGNOSIS — E1165 Type 2 diabetes mellitus with hyperglycemia: Secondary | ICD-10-CM

## 2022-03-22 DIAGNOSIS — F439 Reaction to severe stress, unspecified: Secondary | ICD-10-CM

## 2022-03-22 DIAGNOSIS — Z1231 Encounter for screening mammogram for malignant neoplasm of breast: Secondary | ICD-10-CM

## 2022-03-22 DIAGNOSIS — Z86010 Personal history of colonic polyps: Secondary | ICD-10-CM

## 2022-03-22 NOTE — Progress Notes (Signed)
Subjective:    Patient ID: Lori Guzman, female    DOB: [birth year], 82 y.o.   MRN: [id number]  Patient here for  Chief Complaint  Patient presents with   Medical Management of Chronic Issues    HPI Here to follow up regarding her blood pressure and cholesterol.  Recently traveled to [place].  Did a lot of walking.  Has adjusted her diet.  Feels better.  No chest pain or sob reported.  No abdominal pain or bowel change reported.  Blood pressures averaging 128-132/60-70s. Plans to continue the increased exercise.  Overall feels good.     Past Medical History:  Diagnosis Date   Allergy    reoccuring problems   GERD (gastroesophageal reflux disease)    History of colon polyps    adenomatous   Hypertension    Past Surgical History:  Procedure Laterality Date   BARTHOLIN GLAND CYST EXCISION     CHOLECYSTECTOMY     NASAL POLYP SURGERY  1964   TONSILECTOMY/ADENOIDECTOMY WITH MYRINGOTOMY  1950   Family History  Problem Relation Age of Onset   Heart disease Mother    CVA Mother    Hypertension Mother    Alzheimer's disease Mother    Esophageal cancer Father    Breast cancer Neg Hx    Colon cancer Neg Hx    Social History   Socioeconomic History   Marital status: Widowed    Spouse name: Not on file   Number of children: Not on file   Years of education: Not on file   Highest education level: Not on file  Occupational History   Not on file  Tobacco Use   Smoking status: Never   Smokeless tobacco: Never  Substance and Sexual Activity   Alcohol use: No    Alcohol/week: 0.0 standard drinks of alcohol   Drug use: No   Sexual activity: Not on file  Other Topics Concern   Not on file  Social History Narrative   She is married, has 3 children and is retired.   Social Determinants of Health   Financial Resource Strain: Low Risk  (03/31/2021)   Overall Financial Resource Strain (CARDIA)    Difficulty of Paying Living Expenses: Not hard at all  Food Insecurity:  No Food Insecurity (03/31/2021)   Hunger Vital Sign    Worried About Running Out of Food in the Last Year: Never true    Ran Out of Food in the Last Year: Never true  Transportation Needs: No Transportation Needs (03/31/2021)   PRAPARE - Hydrologist (Medical): No    Lack of Transportation (Non-Medical): No  Physical Activity: Sufficiently Active (03/31/2021)   Exercise Vital Sign    Days of Exercise per Week: 3 days    Minutes of Exercise per Session: 50 min  Stress: No Stress Concern Present (03/31/2021)   [place]    Feeling of Stress : Not at all  Social Connections: Moderately Integrated (03/31/2021)   Social Connection and Isolation Panel [NHANES]    Frequency of Communication with Friends and Family: More than three times a week    Frequency of Social Gatherings with Friends and Family: More than three times a week    Attends Religious Services: 1 to 4 times per year    Active Member of [employer] or Organizations: Yes    Attends Archivist Meetings: Not on file    Marital Status: Widowed  Review of Systems  Constitutional:  Negative for appetite change and unexpected weight change.  HENT:  Negative for congestion and sinus pressure.   Respiratory:  Negative for cough, chest tightness and shortness of breath.   Cardiovascular:  Negative for chest pain, palpitations and leg swelling.  Gastrointestinal:  Negative for abdominal pain, diarrhea, nausea and vomiting.  Genitourinary:  Negative for difficulty urinating and dysuria.  Musculoskeletal:  Negative for joint swelling and myalgias.  Skin:  Negative for color change and rash.  Neurological:  Negative for dizziness and headaches.  Psychiatric/Behavioral:  Negative for agitation and dysphoric mood.        Objective:     BP 132/70   Pulse 77   Temp 97.9 F (36.6 C)   Resp 16   Ht 5' 7"$  (1.702 m)   Wt 176 lb (79.8  kg)   LMP 02/29/1992   SpO2 98%   BMI 27.57 kg/m  Wt Readings from Last 3 Encounters:  03/22/22 176 lb (79.8 kg)  01/11/22 184 lb (83.5 kg)  12/29/21 183 lb 12.8 oz (83.4 kg)    Physical Exam Vitals reviewed.  Constitutional:      General: She is not in acute distress.    Appearance: Normal appearance.  HENT:     Head: Normocephalic and atraumatic.     Right Ear: External ear normal.     Left Ear: External ear normal.  Eyes:     General: No scleral icterus.       Right eye: No discharge.        Left eye: No discharge.     Conjunctiva/sclera: Conjunctivae normal.  Neck:     Thyroid: No thyromegaly.  Cardiovascular:     Rate and Rhythm: Normal rate and regular rhythm.  Pulmonary:     Effort: No respiratory distress.     Breath sounds: Normal breath sounds. No wheezing.  Abdominal:     General: Bowel sounds are normal.     Palpations: Abdomen is soft.     Tenderness: There is no abdominal tenderness.  Musculoskeletal:        General: No swelling or tenderness.     Cervical back: Neck supple. No tenderness.  Lymphadenopathy:     Cervical: No cervical adenopathy.  Skin:    Findings: No erythema or rash.  Neurological:     Mental Status: She is alert.  Psychiatric:        Mood and Affect: Mood normal.        Behavior: Behavior normal.      Outpatient Encounter Medications as of 03/22/2022  Medication Sig   nystatin cream (MYCOSTATIN) Apply 1 Application topically 2 (two) times daily.   amLODipine (NORVASC) 5 MG tablet TAKE 1 TABLET (5 MG TOTAL) BY MOUTH DAILY.   aspirin EC 81 MG tablet Take 81 mg by mouth daily.   escitalopram (LEXAPRO) 20 MG tablet TAKE 1 TABLET BY MOUTH EVERY DAY   mometasone (ELOCON) 0.1 % cream Apply 1 Application topically daily as needed. Appear pea sized ( or less) amount to external ear, and slightly in canal daily as needed for itching   pravastatin (PRAVACHOL) 10 MG tablet TAKE ONE TABLET EVERY MONDAY, WEDNESDAY AND FRIDAY   telmisartan  (MICARDIS) 80 MG tablet TAKE 1 TABLET BY MOUTH EVERY DAY   [DISCONTINUED] predniSONE (DELTASONE) 10 MG tablet Take 4 tablets x 1 day and then decrease by 1/2 tablet per day until down to zero mg.   No facility-administered encounter medications on file  as of 03/22/2022.     Lab Results  Component Value Date   WBC 5.8 03/20/2022   HGB 13.5 03/20/2022   HCT 39.4 03/20/2022   PLT 298.0 03/20/2022   GLUCOSE 106 (H) 03/20/2022   CHOL 176 03/20/2022   TRIG 66.0 03/20/2022   HDL 56.90 03/20/2022   LDLCALC 106 (H) 03/20/2022   ALT 17 03/20/2022   AST 17 03/20/2022   NA 141 03/20/2022   K 4.8 03/20/2022   CL 103 03/20/2022   CREATININE 0.83 03/20/2022   BUN 24 (H) 03/20/2022   CO2 28 03/20/2022   TSH 2.85 03/20/2022   HGBA1C 6.4 03/20/2022   MICROALBUR 2.9 (H) 06/22/2021    MM 3D SCREEN BREAST BILATERAL  Result Date: 04/24/2021 CLINICAL DATA:  Screening. EXAM: DIGITAL SCREENING BILATERAL MAMMOGRAM WITH TOMOSYNTHESIS AND CAD TECHNIQUE: Bilateral screening digital craniocaudal and mediolateral oblique mammograms were obtained. Bilateral screening digital breast tomosynthesis was performed. The images were evaluated with computer-aided detection. COMPARISON:  Previous exam(s). ACR Breast Density Category b: There are scattered areas of fibroglandular density. FINDINGS: There are no findings suspicious for malignancy. IMPRESSION: No mammographic evidence of malignancy. A result letter of this screening mammogram will be mailed directly to the patient. RECOMMENDATION: Screening mammogram in one year. (Code:SM-B-01Y) BI-RADS CATEGORY  1: Negative. Electronically Signed   By: Zerita Boers M.D.   On: 04/24/2021 16:58       Assessment & Plan:  Hypercholesterolemia Assessment & Plan: Continue pravastatin. Follow lipid panel and liver function tests.   Orders: -     Lipid panel; Future -     Hepatic function panel; Future  Type 2 diabetes mellitus with hyperglycemia, without long-term current  use of insulin (HCC) Assessment & Plan: Low carb diet and exercise given elevated blood sugars.  On no medication.   Follow met b and a1c.   Lab Results  Component Value Date   HGBA1C 6.4 03/20/2022    Orders: -     Hemoglobin A1c; Future  Primary hypertension Assessment & Plan: Blood pressure as outlined.  Continue micardis and amlodipine.  Follow pressures.  Follow metabolic panel.   Orders: -     Basic metabolic panel; Future  Visit for screening mammogram -     3D Screening Mammogram, Left and Right; Future  History of colonic polyps Assessment & Plan: Last colonoscopy 2016.  Recommended f/u in 5 years.  Per pt, per GI no f/u colonoscopy warranted.     Stress Assessment & Plan: On lexapro 25m.  Appears to be doing relatively well.  Continue current medication.        CEinar Pheasant MD

## 2022-03-25 ENCOUNTER — Encounter: Payer: Self-pay | Admitting: Internal Medicine

## 2022-03-25 ENCOUNTER — Other Ambulatory Visit: Payer: Self-pay | Admitting: Internal Medicine

## 2022-03-25 NOTE — Assessment & Plan Note (Signed)
Last colonoscopy 2016.  Recommended f/u in 5 years.  Per pt, per GI no f/u colonoscopy warranted.

## 2022-03-25 NOTE — Assessment & Plan Note (Signed)
On lexapro 57m.  Appears to be doing relatively well.  Continue current medication.

## 2022-03-25 NOTE — Assessment & Plan Note (Signed)
Low carb diet and exercise given elevated blood sugars.  On no medication.   Follow met b and a1c.   Lab Results  Component Value Date   HGBA1C 6.4 03/20/2022

## 2022-03-25 NOTE — Assessment & Plan Note (Signed)
Blood pressure as outlined.  Continue micardis and amlodipine.  Follow pressures.  Follow metabolic panel.

## 2022-03-25 NOTE — Assessment & Plan Note (Signed)
Continue pravastatin. Follow lipid panel and liver function tests.

## 2022-03-26 ENCOUNTER — Telehealth: Payer: Self-pay | Admitting: Internal Medicine

## 2022-03-26 NOTE — Telephone Encounter (Signed)
Pt returned call. I scheduled her for 2/29.

## 2022-03-26 NOTE — Telephone Encounter (Signed)
Left message for patient to call back and schedule Medicare Annual Wellness Visit (AWV).   Please offer to do by telephone.  Left office number and my jabber [phone].  Last AWV:03/31/2021   Please schedule at anytime with Nurse Health Advisor.

## 2022-04-01 IMAGING — DX DG KNEE 1-2V*R*
2 series · 2 of 2 positions shown · non-contrast
Comparison: None.

CLINICAL DATA: Right knee pain for 2 weeks.  No known injury.

EXAM:
RIGHT KNEE - 1-2 VIEW

[knee ap]
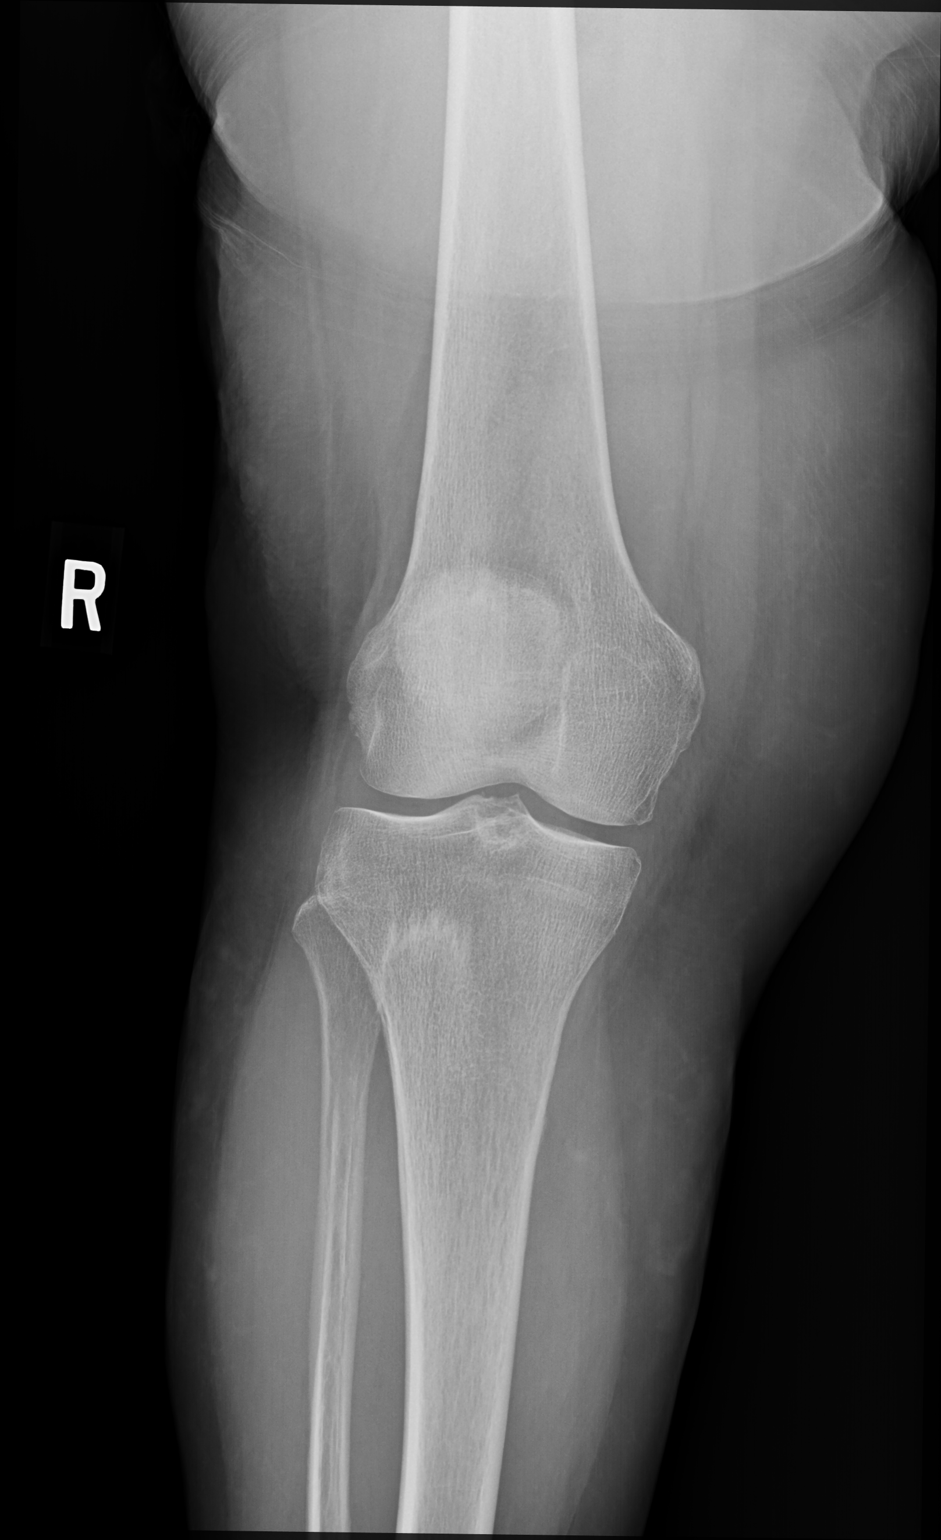

[knee lat]
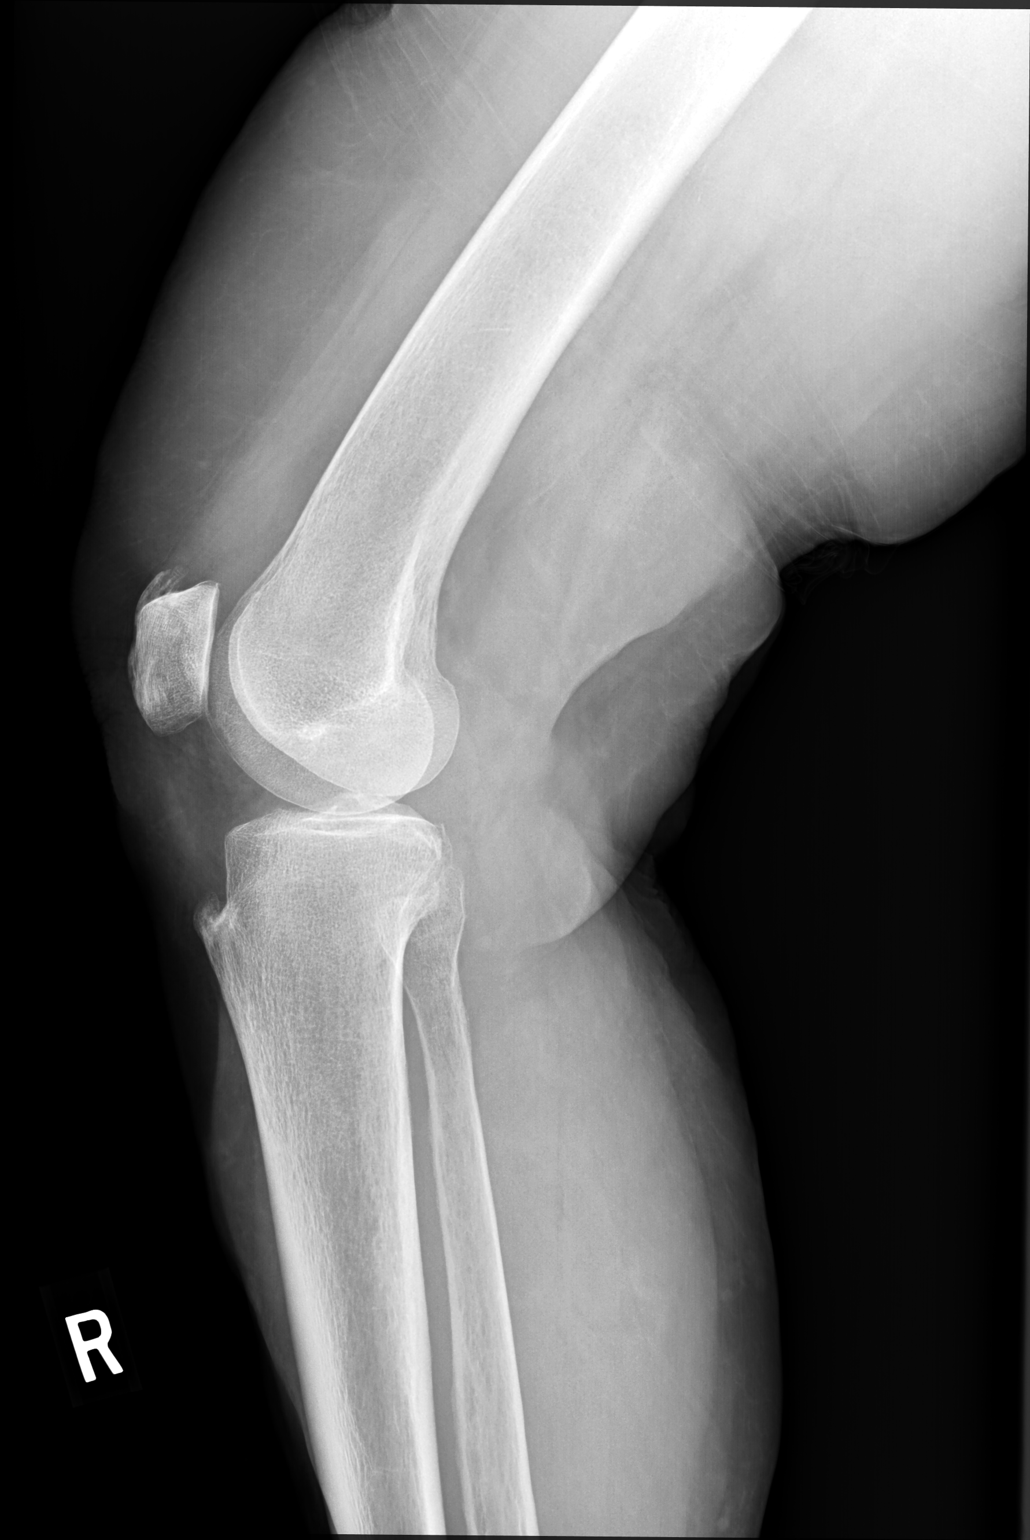

[2 of 2 positions shown; findings below may reference images not displayed]

FINDINGS: Normal alignment. Joint spaces are preserved. There is mild
tricompartmental peripheral spurring and spurring of the tibial
spines. Moderate quadriceps tendon enthesophyte. Mild patellar
tendon enthesophyte at the patellar insertion. There is a moderate
knee joint effusion. No fracture, erosion, or evidence of focal bone
lesion.
IMPRESSION: 1. Mild tricompartmental osteoarthritis. Moderate knee joint
effusion.
2. Quadriceps and patellar tendon enthesopathy.

## 2022-04-12 ENCOUNTER — Ambulatory Visit (INDEPENDENT_AMBULATORY_CARE_PROVIDER_SITE_OTHER): Payer: Medicare PPO | Admitting: *Deleted

## 2022-04-12 ENCOUNTER — Encounter: Payer: Self-pay | Admitting: *Deleted

## 2022-04-12 VITALS — BP 132/70 | Ht 67.0 in | Wt 176.0 lb

## 2022-04-12 DIAGNOSIS — Z0000 Encounter for general adult medical examination without abnormal findings: Secondary | ICD-10-CM

## 2022-04-12 NOTE — Progress Notes (Addendum)
I connected with  AARA KLEHR on 04/12/22 by a audio enabled telemedicine application and verified that I am speaking with the correct person using two identifiers.  Patient Location: Home  Provider Location: Office/Clinic  I discussed the limitations of evaluation and management by telemedicine. The patient expressed understanding and agreed to proceed.   Subjective:   Lori Guzman is a 82 y.o. female who presents for Medicare Annual (Subsequent) preventive examination.  Cardiac Risk Factors include: none    Objective:    Today's Vitals   04/12/22 1500  BP: 132/70  Weight: 176 lb (79.8 kg)  Height: '5\' 7"'$  (1.702 m)   Body mass index is 27.57 kg/m.     04/12/2022    3:31 PM 03/31/2021   11:02 AM 01/19/2020   10:45 AM  Advanced Directives  Does Patient Have a Medical Advance Directive? Yes Yes Yes  Type of Paramedic of [place];Living will [place];Living will [place];Living will  Does patient want to make changes to medical advance directive? Yes (ED - Information included in AVS) No - Patient declined No - Patient declined  Copy of [place] in Chart? No - copy requested No - copy requested No - copy requested    Current Medications (verified) Outpatient Encounter Medications as of 04/12/2022  Medication Sig   amLODipine (NORVASC) 5 MG tablet TAKE 1 TABLET (5 MG TOTAL) BY MOUTH DAILY.   aspirin EC 81 MG tablet Take 81 mg by mouth daily.   escitalopram (LEXAPRO) 20 MG tablet TAKE 1 TABLET BY MOUTH EVERY DAY   mometasone (ELOCON) 0.1 % cream Apply 1 Application topically daily as needed. Appear pea sized ( or less) amount to external ear, and slightly in canal daily as needed for itching   nystatin cream (MYCOSTATIN) Apply 1 Application topically 2 (two) times daily.   Omega-3 Fatty Acids (FISH OIL) 300 MG CAPS Take by mouth.   pravastatin (PRAVACHOL) 10 MG tablet TAKE ONE  TABLET EVERY MONDAY, WEDNESDAY AND FRIDAY   telmisartan (MICARDIS) 80 MG tablet TAKE 1 TABLET BY MOUTH EVERY DAY   No facility-administered encounter medications on file as of 04/12/2022.    Allergies (verified) Patient has no known allergies.   History: Past Medical History:  Diagnosis Date   Allergy    reoccuring problems   GERD (gastroesophageal reflux disease)    History of colon polyps    adenomatous   Hypertension    Past Surgical History:  Procedure Laterality Date   BARTHOLIN GLAND CYST EXCISION     CHOLECYSTECTOMY     NASAL POLYP SURGERY  1964   TONSILECTOMY/ADENOIDECTOMY WITH MYRINGOTOMY  1950   Family History  Problem Relation Age of Onset   Heart disease Mother    CVA Mother    Hypertension Mother    Alzheimer's disease Mother    Esophageal cancer Father    Breast cancer Neg Hx    Colon cancer Neg Hx    Social History   Socioeconomic History   Marital status: Widowed    Spouse name: Not on file   Number of children: Not on file   Years of education: Not on file   Highest education level: Not on file  Occupational History   Not on file  Tobacco Use   Smoking status: Never   Smokeless tobacco: Never  Substance and Sexual Activity   Alcohol use: No    Alcohol/week: 0.0 standard drinks of alcohol  Drug use: No   Sexual activity: Not on file  Other Topics Concern   Not on file  Social History Narrative   She is married, has 3 children and is retired.   Social Determinants of Health   Financial Resource Strain: Low Risk  (04/12/2022)   Overall Financial Resource Strain (CARDIA)    Difficulty of Paying Living Expenses: Not very hard  Food Insecurity: No Food Insecurity (04/12/2022)   Hunger Vital Sign    Worried About Running Out of Food in the Last Year: Never true    Ran Out of Food in the Last Year: Never true  Transportation Needs: No Transportation Needs (04/12/2022)   PRAPARE - Hydrologist (Medical): No     Lack of Transportation (Non-Medical): No  Physical Activity: Insufficiently Active (04/12/2022)   Exercise Vital Sign    Days of Exercise per Week: 3 days    Minutes of Exercise per Session: 20 min  Stress: No Stress Concern Present (04/12/2022)   [place]    Feeling of Stress : Not at all  Social Connections: Moderately Integrated (04/12/2022)   Social Connection and Isolation Panel [NHANES]    Frequency of Communication with Friends and Family: More than three times a week    Frequency of Social Gatherings with Friends and Family: More than three times a week    Attends Religious Services: More than 4 times per year    Active Member of [employer] or Organizations: Yes    Attends Archivist Meetings: Never    Marital Status: Widowed    Tobacco Counseling Counseling given: Not Answered   Clinical Intake:  Pre-visit preparation completed: Yes  Pain : No/denies pain     BMI - recorded: 27.56 Nutritional Status: BMI 25 -29 Overweight Nutritional Risks: None Diabetes: No  How often do you need to have someone help you when you read instructions, pamphlets, or other written materials from your doctor or pharmacy?: 1 - Never  Diabetic? No  Interpreter Needed?: No  Information entered by :: Jari Favre, CMA   Activities of Daily Living    04/12/2022    3:25 PM  In your present state of health, do you have any difficulty performing the following activities:  Hearing? 0  Vision? 0  Difficulty concentrating or making decisions? 0  Walking or climbing stairs? 0  Dressing or bathing? 0  Doing errands, shopping? 0  Preparing Food and eating ? N  Using the Toilet? N  In the past six months, have you accidently leaked urine? N  Do you have problems with loss of bowel control? N  Managing your Medications? N  Managing your Finances? N  Housekeeping or managing your Housekeeping? N    Patient Care  Team: Einar Pheasant, MD as PCP - General (Internal Medicine)  Indicate any recent Medical Services you may have received from other than [facility] providers in the past year (date may be approximate).     Assessment:   This is a routine wellness examination for Lori Guzman.  Hearing/Vision screen No results found.  Dietary issues and exercise activities discussed: Current Exercise Habits: Home exercise routine, Type of exercise: walking, Time (Minutes): 20, Frequency (Times/Week): 3, Weekly Exercise (Minutes/Week): 60, Intensity: Mild, Exercise limited by: None identified   Goals Addressed             This Visit's Progress     Acknowledge receipt of Advanced Directive package  Pt will work on getting her [place] will to the office to be scanned into chart.       Depression Screen    04/12/2022    3:25 PM 01/11/2022    3:53 PM 12/29/2021    3:46 PM 10/30/2021    8:22 AM 10/13/2021    3:13 PM 06/29/2021    7:53 AM 03/31/2021   10:37 AM  PHQ 2/9 Scores  PHQ - 2 Score 0 0 0 0 0 0 0    Fall Risk    04/12/2022    3:25 PM 01/11/2022    3:53 PM 12/29/2021    3:46 PM 10/30/2021    8:21 AM 10/13/2021    3:13 PM  [place] in the past year? 0 0 0 0 0  Number falls in past yr: 0 0 0 0 0  Injury with Fall? 0 0 0 0 0  Risk for fall due to : History of fall(s) No Fall Risks No Fall Risks No Fall Risks No Fall Risks  Follow up  Falls evaluation completed Falls evaluation completed Falls evaluation completed Falls evaluation completed    [place]:  Any stairs in or around the home? Yes  If so, are there any without handrails? No  Home free of loose throw rugs in walkways, pet beds, electrical cords, etc? Yes  Adequate lighting in your home to reduce risk of falls? Yes   ASSISTIVE DEVICES UTILIZED TO PREVENT FALLS:  Life alert? No  Use of a cane, walker or w/c? No  Grab bars in the bathroom? Yes  Shower chair or bench in shower?  Yes  Elevated toilet seat or a handicapped toilet? Yes    Cognitive Function:        04/12/2022    3:29 PM 01/19/2020   11:06 AM  6CIT Screen  What Year? 0 points 0 points  What month? 0 points 0 points  What time? 0 points 0 points  Count back from 20 0 points 0 points  Months in reverse 0 points 0 points  Repeat phrase 6 points 0 points  Total Score 6 points 0 points    Immunizations Immunization History  Administered Date(s) Administered   Fluad Quad(high Dose 65+) 01/13/2020, 10/30/2021   Influenza Split 02/29/2012   Influenza, High Dose Seasonal PF 12/05/2016, 12/29/2018   Influenza,inj,Quad PF,6+ Mos 01/19/2013, 11/30/2013, 12/09/2014, 11/08/2017   Influenza-Unspecified 11/15/2015, 11/12/2020   PFIZER(Purple Top)SARS-COV-2 Vaccination 04/01/2019, 04/27/2019   Pneumococcal Conjugate-13 08/09/2014   Pneumococcal Polysaccharide-23 06/09/2012    TDAP status: Due, Education has been provided regarding the importance of this vaccine. Advised may receive this vaccine at local pharmacy or Health Dept. Aware to provide a copy of the vaccination record if obtained from local pharmacy or Health Dept. Verbalized acceptance and understanding.  Flu Vaccine status: Up to date  Pneumococcal vaccine status: Up to date  Covid-19 vaccine status: Information provided on how to obtain vaccines.   Qualifies for Shingles Vaccine? Yes   Zostavax completed No   Shingrix Completed?: No.    Education has been provided regarding the importance of this vaccine. Patient has been advised to call insurance company to determine out of pocket expense if they have not yet received this vaccine. Advised may also receive vaccine at local pharmacy or Health Dept. Verbalized acceptance and understanding.  Screening Tests Health Maintenance  Topic Date Due   OPHTHALMOLOGY EXAM  10/27/2020   COVID-19 Vaccine (3 -  2023-24 season) 10/13/2021   Zoster Vaccines- Shingrix (1 of 2) 04/12/2022 (Originally  07/04/1990)   COLONOSCOPY (Pts 45-72yr Insurance coverage will need to be confirmed)  03/23/2023 (Originally 04/07/2019)   MAMMOGRAM  04/25/2022   Diabetic kidney evaluation - Urine ACR  06/23/2022   FOOT EXAM  06/30/2022   HEMOGLOBIN A1C  09/18/2022   Diabetic kidney evaluation - eGFR measurement  03/21/2023   Medicare Annual Wellness (AWV)  04/12/2023   Pneumonia Vaccine 82 Years old  Completed   INFLUENZA VACCINE  Completed   DEXA SCAN  Completed   HPV VACCINES  Aged Out   DTaP/Tdap/Td  Discontinued    Health Maintenance  Health Maintenance Due  Topic Date Due   OPHTHALMOLOGY EXAM  10/27/2020   COVID-19 Vaccine (3 - 2023-24 season) 10/13/2021    Colorectal cancer screening: Type of screening: Colonoscopy. Completed 04/06/2014. Repeat every 5 years  Mammogram status: Ordered for 04/26/22. Pt provided with contact info and advised to call to schedule appt.   Bone Density status: Completed 06/28/2016. Results reflect: Bone density results: OSTEOPENIA. Repeat every 5 years.  Lung Cancer Screening: (Low Dose CT Chest recommended if Age 81-80years, 30 pack-year currently smoking OR have quit w/in 15years.) does not qualify.     Additional Screening:  Hepatitis C Screening: does not qualify  Vision Screening: Recommended annual ophthalmology exams for early detection of glaucoma and other disorders of the eye. Is the patient up to date with their annual eye exam?  Yes  Who is the provider or what is the name of the office in which the patient attends annual eye exams? [place] is not established with a provider, would they like to be referred to a provider to establish care? No .   Dental Screening: Recommended annual dental exams for proper oral hygiene  Community Resource Referral / Chronic Care Management: CRR required this visit?  No   CCM required this visit?  No      Plan:     I have personally reviewed and noted the following in the patient's  chart:   Medical and social history Use of alcohol, tobacco or illicit drugs  Current medications and supplements including opioid prescriptions. Patient is not currently taking opioid prescriptions. Functional ability and status Nutritional status Physical activity Advanced directives List of other physicians Hospitalizations, surgeries, and ER visits in previous 12 months Vitals Screenings to include cognitive, depression, and falls Referrals and appointments  In addition, I have reviewed and discussed with patient certain preventive protocols, quality metrics, and best practice recommendations. A written personalized care plan for preventive services as well as general preventive health recommendations were provided to patient.     LCannon Kettle [place]  04/12/2022   Nurse Notes: Total time spent on the telephone was 15 minutes.

## 2022-04-12 NOTE — Patient Instructions (Signed)
  Lori Guzman , Thank you for taking time to come for your Medicare Wellness Visit. I appreciate your ongoing commitment to your health goals. Please review the following plan we discussed and let me know if I can assist you in the future.   These are the goals we discussed:  Goals       DIET - INCREASE WATER INTAKE (pt-stated)      Stay hydrated      Walk for exercise        This is a list of the screening recommended for you and due dates:  Health Maintenance  Topic Date Due   Eye exam for diabetics  10/27/2020   COVID-19 Vaccine (3 - 2023-24 season) 10/13/2021   Zoster (Shingles) Vaccine (1 of 2) 04/12/2022*   Colon Cancer Screening  03/23/2023*   Mammogram  04/25/2022   Yearly kidney health urinalysis for diabetes  06/23/2022   Complete foot exam   06/30/2022   Hemoglobin A1C  09/18/2022   Yearly kidney function blood test for diabetes  03/21/2023   Medicare Annual Wellness Visit  04/12/2023   Pneumonia Vaccine  Completed   Flu Shot  Completed   DEXA scan (bone density measurement)  Completed   HPV Vaccine  Aged Out   DTaP/Tdap/Td vaccine  Discontinued  *Topic was postponed. The date shown is not the original due date.

## 2022-04-20 ENCOUNTER — Encounter: Payer: Self-pay | Admitting: Internal Medicine

## 2022-04-20 NOTE — Telephone Encounter (Signed)
I called the patient and informed her that she would need to be seen by Urgent Care because Dr. Nicki Reaper is not here, I offered to schedule her for Urgent Care in [facility] or [place] and she declined stating it was not that bad and she only wanted medications called in, I informed her that she needed to be seen, the providers would not call in medications without being seen. Kayte Borchard,cma

## 2022-04-21 ENCOUNTER — Ambulatory Visit
Admission: EM | Admit: 2022-04-21 | Discharge: 2022-04-21 | Disposition: A | Discharge status: Home / Self Care | Payer: Medicare PPO | Attending: Emergency Medicine | Admitting: Emergency Medicine

## 2022-04-21 ENCOUNTER — Encounter: Payer: Self-pay | Admitting: Emergency Medicine

## 2022-04-21 DIAGNOSIS — N390 Urinary tract infection, site not specified: Secondary | ICD-10-CM | POA: Diagnosis not present

## 2022-04-21 LAB — URINALYSIS, W/ REFLEX TO CULTURE (INFECTION SUSPECTED)
Bilirubin Urine: NEGATIVE
Glucose, UA: 100 mg/dL — AB
Ketones, ur: NEGATIVE mg/dL
Nitrite: POSITIVE — AB
Specific Gravity, Urine: 1.015 (ref 1.005–1.030)
WBC, UA: >50 WBC/hpf (ref 0–5)
pH: 7 (ref 5.0–8.0)

## 2022-04-21 MED ORDER — PHENAZOPYRIDINE HCL 200 MG PO TABS: 200.0000 mg | ORAL_TABLET | Freq: Three times a day (TID) | ORAL | 0 refills | Status: DC

## 2022-04-21 MED ORDER — CEPHALEXIN 500 MG PO CAPS: 500.0000 mg | ORAL_CAPSULE | Freq: Two times a day (BID) | ORAL | 0 refills | Status: AC

## 2022-04-21 MED ORDER — FLUCONAZOLE 150 MG PO TABS: 150.0000 mg | ORAL_TABLET | Freq: Every day | ORAL | 0 refills | Status: AC

## 2022-04-21 NOTE — Discharge Instructions (Addendum)
Take the Keflex twice daily for 5 days with food for treatment of urinary tract infection.  Use the Pyridium every 8 hours as needed for urinary discomfort.  This will turn your urine a bright red-orange.  Increase your oral fluid intake so that you increase your urine production and or flushing your urinary system.  Take an over-the-counter probiotic, such as Culturelle-Align-Activia, 1 hour after each dose of antibiotic to prevent diarrhea or yeast infections from forming.  We will culture urine and change the antibiotics if necessary.  Your urinalysis also had budding yeast and I will treat this with a medication called Diflucan.  Take 1 Diflucan tablet now and repeat every 3 days for total of 3 doses.  Return for reevaluation, or see your primary care provider, for any new or worsening symptoms.

## 2022-04-21 NOTE — ED Triage Notes (Signed)
Patient c/o dysuria and urinary frequency that started yesterday.  Patient reports taking AZO.

## 2022-04-21 NOTE — ED Provider Notes (Signed)
[facility]    CSN: [id number] Arrival date & time: 04/21/22  1313      History   Chief Complaint Chief Complaint  Patient presents with   Urinary Frequency   Dysuria    HPI Lori Guzman is a 82 y.o. female.   HPI  82 year old female here for evaluation of urinary complaints.  The patient has a past medical history that is significant for diabetes, hypertension, and GERD presenting for evaluation of pain with urination along with urinary urgency that started yesterday.  She also endorses an increase in nocturia and some mild suprapubic pressure but no pain.  She has not had any fever and denies seeing any blood in her urine.  She denies nausea or vomiting.  Past Medical History:  Diagnosis Date   Allergy    reoccuring problems   GERD (gastroesophageal reflux disease)    History of colon polyps    adenomatous   Hypertension     Patient Active Problem List   Diagnosis Date Noted   Pneumonia 01/14/2022   Bronchitis 12/29/2021   Itching 11/05/2021   Otitis media 10/13/2021   Rash 03/19/2021   Muscle ache 03/16/2021   Right knee pain 09/27/2020   Sinus congestion 08/15/2020   Thumb pain, left 03/27/2020   Lower abdominal pain 03/03/2017   Dizziness 02/03/2016   Neck pain 01/15/2016   Fatigue 08/22/2015   Near syncope 08/22/2015   Light headedness 08/10/2014   BMI 31.0-31.9,adult 04/05/2014   Health care maintenance 04/05/2014   Back pain 12/06/2013   Hypercholesterolemia 12/06/2013   Gum lesion 07/19/2013   Stress 01/21/2013   Diabetes ([place]) 08/15/2012   GERD (gastroesophageal reflux disease) 06/09/2012   History of colonic polyps 06/09/2012   Hypertension 12/17/2011    Past Surgical History:  Procedure Laterality Date   BARTHOLIN GLAND CYST EXCISION     CHOLECYSTECTOMY     NASAL POLYP SURGERY  1964   TONSILECTOMY/ADENOIDECTOMY WITH MYRINGOTOMY  1950    OB History   No obstetric history on file.      Home Medications     Prior to Admission medications   Medication Sig Start Date End Date Taking? Authorizing Provider  amLODipine (NORVASC) 5 MG tablet TAKE 1 TABLET (5 MG TOTAL) BY MOUTH DAILY. 12/28/21  Yes Leone Haven, MD  aspirin EC 81 MG tablet Take 81 mg by mouth daily.   Yes [provider]  cephALEXin (KEFLEX) 500 MG capsule Take 1 capsule (500 mg total) by mouth 2 (two) times daily for 5 days. 04/21/22 04/26/22 Yes Margarette Canada, NP  escitalopram (LEXAPRO) 20 MG tablet TAKE 1 TABLET BY MOUTH EVERY DAY 03/26/22  Yes Einar Pheasant, MD  fluconazole (DIFLUCAN) 150 MG tablet Take 1 tablet (150 mg total) by mouth daily for 3 doses. Take 1 tablet now and repeat every 3 days for total of 3 doses. 04/21/22 04/24/22 Yes Margarette Canada, NP  Omega-3 Fatty Acids (FISH OIL) 300 MG CAPS Take by mouth.   Yes [provider]  phenazopyridine (PYRIDIUM) 200 MG tablet Take 1 tablet (200 mg total) by mouth 3 (three) times daily. 04/21/22  Yes Margarette Canada, NP  pravastatin (PRAVACHOL) 10 MG tablet TAKE ONE TABLET EVERY MONDAY, [place] 12/28/21  Yes Leone Haven, MD  telmisartan (MICARDIS) 80 MG tablet TAKE 1 TABLET BY MOUTH EVERY DAY 01/17/22  Yes Einar Pheasant, MD  mometasone (ELOCON) 0.1 % cream Apply 1 Application topically daily as needed. Appear pea sized ( or  less) amount to external ear, and slightly in canal daily as needed for itching 10/13/21   Burnard Hawthorne, FNP  nystatin cream (MYCOSTATIN) Apply 1 Application topically 2 (two) times daily. 01/18/22   Einar Pheasant, MD    Family History Family History  Problem Relation Age of Onset   Heart disease Mother    CVA Mother    Hypertension Mother    Alzheimer's disease Mother    Esophageal cancer Father    Breast cancer Neg Hx    Colon cancer Neg Hx     Social History Social History   Tobacco Use   Smoking status: Never   Smokeless tobacco: Never  Vaping Use   Vaping Use: Never used  Substance Use Topics   Alcohol  use: No    Alcohol/week: 0.0 standard drinks of alcohol   Drug use: No     Allergies   Patient has no known allergies.   Review of Systems Review of Systems  Constitutional:  Negative for fever.  Gastrointestinal:  Negative for abdominal pain.  Genitourinary:  Positive for dysuria, frequency and urgency. Negative for hematuria.  Musculoskeletal:  Negative for back pain.     Physical Exam Triage Vital Signs ED Triage Vitals  Enc Vitals Group     BP 04/21/22 1327 (!) 164/73     Pulse Rate 04/21/22 1327 66     Resp 04/21/22 1327 15     Temp 04/21/22 1327 98.1 F (36.7 C)     Temp Source 04/21/22 1327 Oral     SpO2 04/21/22 1327 95 %     Weight 04/21/22 1325 175 lb 14.8 oz (79.8 kg)     Height 04/21/22 1325 '5\' 7"'$  (1.702 m)     Head Circumference --      Peak Flow --      Pain Score 04/21/22 1325 5     Pain Loc --      Pain Edu? --      Excl. in [place]? --    No data found.  Updated Vital Signs BP (!) 164/73 (BP Location: Left Arm)   Pulse 66   Temp 98.1 F (36.7 C) (Oral)   Resp 15   Ht '5\' 7"'$  (1.702 m)   Wt 175 lb 14.8 oz (79.8 kg)   LMP 02/29/1992   SpO2 95%   BMI 27.55 kg/m   Visual Acuity Right Eye Distance:   Left Eye Distance:   Bilateral Distance:    Right Eye Near:   Left Eye Near:    Bilateral Near:     Physical Exam Vitals and nursing note reviewed.  Constitutional:      Appearance: Normal appearance. She is not ill-appearing.  HENT:     Head: Normocephalic and atraumatic.  Cardiovascular:     Rate and Rhythm: Normal rate and regular rhythm.     Pulses: Normal pulses.     Heart sounds: Normal heart sounds. No murmur heard.    No friction rub. No gallop.  Pulmonary:     Effort: Pulmonary effort is normal.     Breath sounds: Normal breath sounds. No wheezing, rhonchi or rales.  Abdominal:     Tenderness: There is no right CVA tenderness or left CVA tenderness.  Skin:    General: Skin is warm and dry.     Capillary Refill: Capillary  refill takes less than 2 seconds.  Neurological:     General: No focal deficit present.     Mental Status: She is  alert and oriented to person, place, and time.      UC Treatments / Results  Labs (all labs ordered are listed, but only abnormal results are displayed) Labs Reviewed  URINALYSIS, W/ REFLEX TO CULTURE (INFECTION SUSPECTED) - Abnormal; Notable for the following components:      Result Value   Color, Urine ORANGE (*)    APPearance HAZY (*)    Glucose, UA 100 (*)    Hgb urine dipstick MODERATE (*)    Protein, ur TRACE (*)    Nitrite POSITIVE (*)    Leukocytes,Ua LARGE (*)    Bacteria, UA FEW (*)    All other components within normal limits  URINE CULTURE    EKG   Radiology No results found.  Procedures Procedures (including critical care time)  Medications Ordered in UC Medications - No data to display  Initial Impression / Assessment and Plan / UC Course  I have reviewed the triage vital signs and the nursing notes.  Pertinent labs & imaging results that were available during my care of the patient were reviewed by me and considered in my medical decision making (see chart for details).   Patient is a pleasant, nontoxic-appearing 82 year old female presenting for evaluation of acute onset of dysuria with urinary urgency, frequency, and nocturia that started yesterday.  She states that she does not have a longstanding history of urinary tract infections and she is not sure what prompted the symptoms.  She has had no nausea or vomiting or abdominal pain but she does endorse some suprapubic abdominal pressure.  She has not seen any blood in her urine.  I will order urinalysis to look for the presence of infection.  Urinalysis shows orange color with hazy appearance, 100 glucose, moderate hemoglobin, trace protein, nitrite positive with large leukocyte esterase.  Reflex microscopy shows greater than 50 WBCs with 21-50 RBCs, few bacteria, and budding yeast.  Urine  will reflex to culture.  I will discharge patient home with a diagnosis of urinary tract infection and start her on Keflex 500 mg twice daily for 5 days as well as Diflucan and have her take 1 150 mg tablet now and repeat every 3 days for total of 3 doses.  I will also prescribe Pyridium to help with urine Neri discomfort.   Final Clinical Impressions(s) / UC Diagnoses   Final diagnoses:  Lower urinary tract infectious disease     Discharge Instructions      Take the Keflex twice daily for 5 days with food for treatment of urinary tract infection.  Use the Pyridium every 8 hours as needed for urinary discomfort.  This will turn your urine a bright red-orange.  Increase your oral fluid intake so that you increase your urine production and or flushing your urinary system.  Take an over-the-counter probiotic, such as Culturelle-Align-Activia, 1 hour after each dose of antibiotic to prevent diarrhea or yeast infections from forming.  We will culture urine and change the antibiotics if necessary.  Your urinalysis also had budding yeast and I will treat this with a medication called Diflucan.  Take 1 Diflucan tablet now and repeat every 3 days for total of 3 doses.  Return for reevaluation, or see your primary care provider, for any new or worsening symptoms.      ED Prescriptions     Medication Sig Dispense Auth. Provider   fluconazole (DIFLUCAN) 150 MG tablet Take 1 tablet (150 mg total) by mouth daily for 3 doses. Take 1 tablet now  and repeat every 3 days for total of 3 doses. 3 tablet Margarette Canada, NP   cephALEXin (KEFLEX) 500 MG capsule Take 1 capsule (500 mg total) by mouth 2 (two) times daily for 5 days. 10 capsule Margarette Canada, NP   phenazopyridine (PYRIDIUM) 200 MG tablet Take 1 tablet (200 mg total) by mouth 3 (three) times daily. 6 tablet Margarette Canada, NP      PDMP not reviewed this encounter.   Margarette Canada, NP 04/21/22 1354

## 2022-04-23 LAB — URINE CULTURE: Culture: >=100000 — AB

## 2022-04-26 ENCOUNTER — Ambulatory Visit
Admission: RE | Admit: 2022-04-26 | Discharge: 2022-04-26 | Disposition: A | Discharge status: Home / Self Care | Payer: Medicare PPO | Source: Ambulatory Visit | Attending: Internal Medicine | Admitting: Internal Medicine

## 2022-04-26 DIAGNOSIS — Z1231 Encounter for screening mammogram for malignant neoplasm of breast: Secondary | ICD-10-CM | POA: Diagnosis not present

## 2022-05-24 DIAGNOSIS — H353132 Nonexudative age-related macular degeneration, bilateral, intermediate dry stage: Secondary | ICD-10-CM | POA: Diagnosis not present

## 2022-05-28 DIAGNOSIS — H2513 Age-related nuclear cataract, bilateral: Secondary | ICD-10-CM | POA: Diagnosis not present

## 2022-05-28 DIAGNOSIS — H31092 Other chorioretinal scars, left eye: Secondary | ICD-10-CM | POA: Diagnosis not present

## 2022-05-28 LAB — HM DIABETES EYE EXAM

## 2022-06-12 DIAGNOSIS — L281 Prurigo nodularis: Secondary | ICD-10-CM | POA: Diagnosis not present

## 2022-06-12 DIAGNOSIS — L2089 Other atopic dermatitis: Secondary | ICD-10-CM | POA: Diagnosis not present

## 2022-06-24 ENCOUNTER — Other Ambulatory Visit: Payer: Self-pay | Admitting: Family Medicine

## 2022-07-18 ENCOUNTER — Encounter: Payer: Self-pay | Admitting: Internal Medicine

## 2022-07-19 ENCOUNTER — Ambulatory Visit (INDEPENDENT_AMBULATORY_CARE_PROVIDER_SITE_OTHER): Payer: Medicare PPO

## 2022-07-19 ENCOUNTER — Other Ambulatory Visit: Payer: Medicare PPO

## 2022-07-19 ENCOUNTER — Encounter: Payer: Self-pay | Admitting: Nurse Practitioner

## 2022-07-19 ENCOUNTER — Encounter: Payer: Self-pay | Admitting: Internal Medicine

## 2022-07-19 ENCOUNTER — Ambulatory Visit: Payer: Medicare PPO | Admitting: Nurse Practitioner

## 2022-07-19 VITALS — BP 130/76 | HR 65 | Temp 98.4°F | Ht 67.0 in | Wt 175.0 lb

## 2022-07-19 DIAGNOSIS — R051 Acute cough: Secondary | ICD-10-CM

## 2022-07-19 DIAGNOSIS — R059 Cough, unspecified: Secondary | ICD-10-CM | POA: Diagnosis not present

## 2022-07-19 MED ORDER — AZITHROMYCIN 250 MG PO TABS: ORAL_TABLET | ORAL | 0 refills | Status: DC

## 2022-07-19 MED ORDER — BENZONATATE 200 MG PO CAPS: 200.0000 mg | ORAL_CAPSULE | Freq: Three times a day (TID) | ORAL | 0 refills | Status: DC | PRN

## 2022-07-19 NOTE — Progress Notes (Signed)
Bethanie Dicker, NP-C Phone: [phone]  Lori Guzman is a 82 y.o. female who presents today for cough.   Patient was working outside, cleaning her porch, two days ago when she started having a cough. She feels that she can hear herself wheezing and rattling. The cough is worse at night.   Respiratory illness:  Cough- Yes  Congestion-    Sinus- No   Chest- Yes  Post nasal drip- Yes  Sore throat- No  Shortness of breath- No  Fever- No  Fatigue/Myalgia- Yes Headache- No Nausea/Vomiting- No Taste disturbance- No  Smell disturbance- No  Covid exposure- No  Covid vaccination- x 2  Flu vaccination- UTD  Medications- None  Social History   Tobacco Use  Smoking Status Never  Smokeless Tobacco Never    Current Outpatient Medications on File Prior to Visit  Medication Sig Dispense Refill   amLODipine (NORVASC) 5 MG tablet TAKE 1 TABLET (5 MG TOTAL) BY MOUTH DAILY. 90 tablet 1   aspirin EC 81 MG tablet Take 81 mg by mouth daily.     escitalopram (LEXAPRO) 20 MG tablet TAKE 1 TABLET BY MOUTH EVERY DAY 90 tablet 1   mometasone (ELOCON) 0.1 % cream Apply 1 Application topically daily as needed. Appear pea sized ( or less) amount to external ear, and slightly in canal daily as needed for itching 15 g 1   nystatin cream (MYCOSTATIN) Apply 1 Application topically 2 (two) times daily. 30 g 0   Omega-3 Fatty Acids (FISH OIL) 300 MG CAPS Take by mouth.     pravastatin (PRAVACHOL) 10 MG tablet TAKE ONE TABLET EVERY MONDAY, WEDNESDAY AND FRIDAY 39 tablet 1   telmisartan (MICARDIS) 80 MG tablet TAKE 1 TABLET BY MOUTH EVERY DAY 90 tablet 3   No current facility-administered medications on file prior to visit.    ROS see history of present illness  Objective  Physical Exam Vitals:   07/19/22 0817  BP: 130/76  Pulse: 65  Temp: 98.4 F (36.9 C)  SpO2: 97%    BP Readings from Last 3 Encounters:  07/19/22 130/76  04/21/22 (!) 164/73  04/12/22 132/70   Wt Readings from Last  3 Encounters:  07/19/22 175 lb (79.4 kg)  04/21/22 175 lb 14.8 oz (79.8 kg)  04/12/22 176 lb (79.8 kg)    Physical Exam Constitutional:      General: She is not in acute distress.    Appearance: Normal appearance.  HENT:     Head: Normocephalic.     Right Ear: Tympanic membrane normal.     Left Ear: Tympanic membrane normal.     Nose: Nose normal.     Mouth/Throat:     Mouth: Mucous membranes are moist.     Pharynx: Oropharynx is clear.  Eyes:     Conjunctiva/sclera: Conjunctivae normal.     Pupils: Pupils are equal, round, and reactive to light.  Cardiovascular:     Rate and Rhythm: Normal rate and regular rhythm.     Heart sounds: Normal heart sounds.  Pulmonary:     Effort: Pulmonary effort is normal.     Breath sounds: Rales (crackles noted throughout, left > right) present.  Abdominal:     General: Abdomen is flat. Bowel sounds are normal.     Palpations: Abdomen is soft. There is no mass.     Tenderness: There is no abdominal tenderness.  Lymphadenopathy:     Cervical: No cervical adenopathy.  Skin:    General: Skin is warm and  dry.  Neurological:     General: No focal deficit present.     Mental Status: She is alert.  Psychiatric:        Mood and Affect: Mood normal.        Behavior: Behavior normal.    Assessment/Plan: Please see individual problem list.  Acute cough Assessment & Plan: Cough x 2 days. Crackles noted throughout on exam, concern for pneumonia. Will get chest x-ray for further evaluation. Will start patient on Azithromycin and Benzonatate for cough. If x-ray shows pneumonia will add Augmentin to treatment regimen. Strict return precautions given to patient.   Orders: -     DG Chest 2 View; Future -     Azithromycin; Take 2 tablets on day 1, then 1 tablet daily on days 2 through 5  Dispense: 6 tablet; Refill: 0 -     Benzonatate; Take 1 capsule (200 mg total) by mouth 3 (three) times daily as needed for cough.  Dispense: 30 capsule; Refill:  0   Return in about 5 weeks (around 08/23/2022) for Follow up.   Bethanie Dicker, NP-C [facility] - [employer]

## 2022-07-19 NOTE — Assessment & Plan Note (Addendum)
Cough x 2 days. Crackles noted throughout on exam, concern for pneumonia. Will get chest x-ray for further evaluation. Will start patient on Azithromycin and Benzonatate for cough. If x-ray shows pneumonia will add Augmentin to treatment regimen. Strict return precautions given to patient.

## 2022-07-24 ENCOUNTER — Encounter: Payer: Self-pay | Admitting: Internal Medicine

## 2022-07-24 ENCOUNTER — Ambulatory Visit (INDEPENDENT_AMBULATORY_CARE_PROVIDER_SITE_OTHER): Payer: Medicare PPO | Admitting: Internal Medicine

## 2022-07-24 VITALS — BP 130/76 | HR 74 | Temp 97.9°F | Resp 16 | Ht 67.0 in | Wt 174.0 lb

## 2022-07-24 DIAGNOSIS — I10 Essential (primary) hypertension: Secondary | ICD-10-CM | POA: Diagnosis not present

## 2022-07-24 DIAGNOSIS — F439 Reaction to severe stress, unspecified: Secondary | ICD-10-CM | POA: Diagnosis not present

## 2022-07-24 DIAGNOSIS — E7800 Pure hypercholesterolemia, unspecified: Secondary | ICD-10-CM

## 2022-07-24 DIAGNOSIS — Z0000 Encounter for general adult medical examination without abnormal findings: Secondary | ICD-10-CM | POA: Diagnosis not present

## 2022-07-24 DIAGNOSIS — R9389 Abnormal findings on diagnostic imaging of other specified body structures: Secondary | ICD-10-CM | POA: Diagnosis not present

## 2022-07-24 DIAGNOSIS — E1165 Type 2 diabetes mellitus with hyperglycemia: Secondary | ICD-10-CM

## 2022-07-24 DIAGNOSIS — R0982 Postnasal drip: Secondary | ICD-10-CM

## 2022-07-24 LAB — BASIC METABOLIC PANEL
BUN: 17 mg/dL (ref 6–23)
CO2: 29 mEq/L (ref 19–32)
Calcium: 9.3 mg/dL (ref 8.4–10.5)
Chloride: 103 mEq/L (ref 96–112)
Creatinine, Ser: 0.75 mg/dL (ref 0.40–1.20)
GFR: 74.86 mL/min (ref >60.00)
Glucose, Bld: 110 mg/dL — ABNORMAL HIGH (ref 70–99)
Potassium: 4.7 mEq/L (ref 3.5–5.1)
Sodium: 142 mEq/L (ref 135–145)

## 2022-07-24 LAB — HEPATIC FUNCTION PANEL
ALT: 15 U/L (ref 0–35)
AST: 18 U/L (ref 0–37)
Albumin: 4.1 g/dL (ref 3.5–5.2)
Alkaline Phosphatase: 61 U/L (ref 39–117)
Bilirubin, Direct: 0.2 mg/dL (ref 0.0–0.3)
Total Bilirubin: 1.2 mg/dL (ref 0.2–1.2)
Total Protein: 6.9 g/dL (ref 6.0–8.3)

## 2022-07-24 LAB — MICROALBUMIN / CREATININE URINE RATIO
Creatinine,U: 161.5 mg/dL
Microalb Creat Ratio: 1.2 mg/g (ref 0.0–30.0)
Microalb, Ur: 2 mg/dL — ABNORMAL HIGH (ref 0.0–1.9)

## 2022-07-24 LAB — LIPID PANEL
Cholesterol: 143 mg/dL (ref 0–200)
HDL: 51.7 mg/dL (ref >39.00)
LDL Cholesterol: 78 mg/dL (ref 0–99)
NonHDL: 91.15
Total CHOL/HDL Ratio: 3
Triglycerides: 65 mg/dL (ref 0.0–149.0)
VLDL: 13 mg/dL (ref 0.0–40.0)

## 2022-07-24 LAB — HEMOGLOBIN A1C: Hgb A1c MFr Bld: 6.4 % (ref 4.6–6.5)

## 2022-07-24 MED ORDER — ESCITALOPRAM OXALATE 20 MG PO TABS: 20.0000 mg | ORAL_TABLET | Freq: Every day | ORAL | 1 refills | Status: DC

## 2022-07-24 NOTE — Progress Notes (Signed)
Subjective:    Patient ID: Lori Guzman, female    DOB: [birth year], 82 y.o.   MRN: [id number]  Patient here for  Chief Complaint  Patient presents with   Annual Exam    HPI Here for physical exam. Continues on micardis and amlodipine for blood pressure.  Continues on lexapro. Evaluated 07/19/22 - cough and congestion. Had cxr. Discussed results and discussed recommendation for high resolution CT.  Cough has resolved.  No chest pain or sob.  Some allergy symptoms - drainage.  Taking nasonex.  Will start back on zyrtec.  No abdominal pain or cramping.  No bowel change reported.  Stays active.  Works in her yard.  Reviewed outside blood pressures.    Past Medical History:  Diagnosis Date   Allergy    reoccuring problems   GERD (gastroesophageal reflux disease)    History of colon polyps    adenomatous   Hypertension    Past Surgical History:  Procedure Laterality Date   BARTHOLIN GLAND CYST EXCISION     CHOLECYSTECTOMY     NASAL POLYP SURGERY  1964   TONSILECTOMY/ADENOIDECTOMY WITH MYRINGOTOMY  1950   Family History  Problem Relation Age of Onset   Heart disease Mother    CVA Mother    Hypertension Mother    Alzheimer's disease Mother    Esophageal cancer Father    Breast cancer Neg Hx    Colon cancer Neg Hx    Social History   Socioeconomic History   Marital status: Widowed    Spouse name: Not on file   Number of children: Not on file   Years of education: Not on file   Highest education level: Associate degree: occupational, Scientist, product/process development, or vocational program  Occupational History   Not on file  Tobacco Use   Smoking status: Never   Smokeless tobacco: Never  Vaping Use   Vaping Use: Never used  Substance and Sexual Activity   Alcohol use: No    Alcohol/week: 0.0 standard drinks of alcohol   Drug use: No   Sexual activity: Not on file  Other Topics Concern   Not on file  Social History Narrative   She is married, has 3 children and is retired.    Social Determinants of Health   Financial Resource Strain: Low Risk  (07/18/2022)   Overall Financial Resource Strain (CARDIA)    Difficulty of Paying Living Expenses: Not very hard  Food Insecurity: No Food Insecurity (07/18/2022)   Hunger Vital Sign    Worried About Running Out of Food in the Last Year: Never true    Ran Out of Food in the Last Year: Never true  Transportation Needs: No Transportation Needs (07/18/2022)   PRAPARE - Administrator, Civil Service (Medical): No    Lack of Transportation (Non-Medical): No  Physical Activity: Insufficiently Active (07/18/2022)   Exercise Vital Sign    Days of Exercise per Week: 4 days    Minutes of Exercise per Session: 30 min  Stress: No Stress Concern Present (07/18/2022)   [employer] of Occupational Health - Occupational Stress Questionnaire    Feeling of Stress : Not at all  Social Connections: Moderately Isolated (07/18/2022)   Social Connection and Isolation Panel [NHANES]    Frequency of Communication with Friends and Family: More than three times a week    Frequency of Social Gatherings with Friends and Family: More than three times a week    Attends Religious Services: More than 4  times per year    Active Member of Clubs or Organizations: No    Attends Banker Meetings: Never    Marital Status: Widowed     Review of Systems  Constitutional:  Negative for appetite change and unexpected weight change.  HENT:  Positive for postnasal drip. Negative for sinus pressure and sore throat.   Eyes:  Negative for pain and visual disturbance.  Respiratory:  Negative for cough, chest tightness and shortness of breath.   Cardiovascular:  Negative for chest pain, palpitations and leg swelling.  Gastrointestinal:  Negative for abdominal pain, diarrhea, nausea and vomiting.  Genitourinary:  Negative for difficulty urinating and dysuria.  Musculoskeletal:  Negative for joint swelling and myalgias.  Skin:  Negative  for color change and rash.  Neurological:  Negative for dizziness and headaches.  Hematological:  Negative for adenopathy. Does not bruise/bleed easily.  Psychiatric/Behavioral:  Negative for agitation and dysphoric mood.        Objective:     BP 130/76   Pulse 74   Temp 97.9 F (36.6 C)   Resp 16   Ht 5\' 7"  (1.702 m)   Wt 174 lb (78.9 kg)   LMP 02/29/1992   SpO2 98%   BMI 27.25 kg/m  Wt Readings from Last 3 Encounters:  07/24/22 174 lb (78.9 kg)  07/19/22 175 lb (79.4 kg)  04/21/22 175 lb 14.8 oz (79.8 kg)    Physical Exam Vitals reviewed.  Constitutional:      General: She is not in acute distress.    Appearance: Normal appearance. She is well-developed.  HENT:     Head: Normocephalic and atraumatic.     Right Ear: External ear normal.     Left Ear: External ear normal.  Eyes:     General: No scleral icterus.       Right eye: No discharge.        Left eye: No discharge.     Conjunctiva/sclera: Conjunctivae normal.  Neck:     Thyroid: No thyromegaly.  Cardiovascular:     Rate and Rhythm: Normal rate and regular rhythm.  Pulmonary:     Effort: No tachypnea, accessory muscle usage or respiratory distress.     Breath sounds: Normal breath sounds. No decreased breath sounds or wheezing.  Chest:  Breasts:    Right: No inverted nipple, mass, nipple discharge or tenderness (no axillary adenopathy).     Left: No inverted nipple, mass, nipple discharge or tenderness (no axilarry adenopathy).  Abdominal:     General: Bowel sounds are normal.     Palpations: Abdomen is soft.     Tenderness: There is no abdominal tenderness.  Musculoskeletal:        General: No swelling or tenderness.     Cervical back: Neck supple.  Lymphadenopathy:     Cervical: No cervical adenopathy.  Skin:    Findings: No erythema or rash.  Neurological:     Mental Status: She is alert and oriented to person, place, and time.  Psychiatric:        Mood and Affect: Mood normal.         Behavior: Behavior normal.      Outpatient Encounter Medications as of 07/24/2022  Medication Sig   amLODipine (NORVASC) 5 MG tablet TAKE 1 TABLET (5 MG TOTAL) BY MOUTH DAILY.   aspirin EC 81 MG tablet Take 81 mg by mouth daily.   escitalopram (LEXAPRO) 20 MG tablet Take 1 tablet (20 mg total) by mouth daily.  mometasone (ELOCON) 0.1 % cream Apply 1 Application topically daily as needed. Appear pea sized ( or less) amount to external ear, and slightly in canal daily as needed for itching   nystatin cream (MYCOSTATIN) Apply 1 Application topically 2 (two) times daily.   Omega-3 Fatty Acids (FISH OIL) 300 MG CAPS Take by mouth.   pravastatin (PRAVACHOL) 10 MG tablet TAKE ONE TABLET EVERY MONDAY, WEDNESDAY AND FRIDAY   telmisartan (MICARDIS) 80 MG tablet TAKE 1 TABLET BY MOUTH EVERY DAY   [DISCONTINUED] azithromycin (ZITHROMAX) 250 MG tablet Take 2 tablets on day 1, then 1 tablet daily on days 2 through 5   [DISCONTINUED] benzonatate (TESSALON) 200 MG capsule Take 1 capsule (200 mg total) by mouth 3 (three) times daily as needed for cough.   [DISCONTINUED] escitalopram (LEXAPRO) 20 MG tablet TAKE 1 TABLET BY MOUTH EVERY DAY   No facility-administered encounter medications on file as of 07/24/2022.     Lab Results  Component Value Date   WBC 5.8 03/20/2022   HGB 13.5 03/20/2022   HCT 39.4 03/20/2022   PLT 298.0 03/20/2022   GLUCOSE 110 (H) 07/24/2022   CHOL 143 07/24/2022   TRIG 65.0 07/24/2022   HDL 51.70 07/24/2022   LDLCALC 78 07/24/2022   ALT 15 07/24/2022   AST 18 07/24/2022   NA 142 07/24/2022   K 4.7 07/24/2022   CL 103 07/24/2022   CREATININE 0.75 07/24/2022   BUN 17 07/24/2022   CO2 29 07/24/2022   TSH 2.85 03/20/2022   HGBA1C 6.4 07/24/2022   MICROALBUR 2.0 (H) 07/24/2022    MM 3D SCREEN BREAST BILATERAL  Result Date: 04/27/2022 CLINICAL DATA:  Screening. EXAM: DIGITAL SCREENING BILATERAL MAMMOGRAM WITH TOMOSYNTHESIS AND CAD TECHNIQUE: Bilateral screening digital  craniocaudal and mediolateral oblique mammograms were obtained. Bilateral screening digital breast tomosynthesis was performed. The images were evaluated with computer-aided detection. COMPARISON:  Previous exam(s). ACR Breast Density Category b: There are scattered areas of fibroglandular density. FINDINGS: There are no findings suspicious for malignancy. IMPRESSION: No mammographic evidence of malignancy. A result letter of this screening mammogram will be mailed directly to the patient. RECOMMENDATION: Screening mammogram in one year. (Code:SM-B-01Y) BI-RADS CATEGORY  1: Negative. Electronically Signed   By: Elberta Fortis M.D.   On: 04/27/2022 13:13       Assessment & Plan:  Health care maintenance Assessment & Plan: Physical today 07/24/22.  Mammogram 04/26/22 - Birads I.  Colonoscopy 02/2014 - normal.  Had recommended f/u colonoscopy 5 years. Saw Dr Servando Snare 02/2020 - no follow up colonoscopy at that time.     Abnormal CXR Assessment & Plan: CXR - Stable nonspecific reticulonodular increased interstitial opacity with basilar predominance since November. Favor chronic interstitial lung disease. Lung volumes remain normal. And dedicated high-resolution Chest CT might be valuable to further characterize. Discussed.  Schedule HrCT scan chest.   Orders: -     CT CHEST HIGH RESOLUTION; Future  Primary hypertension Assessment & Plan: Blood pressure as outlined.  Continue micardis and amlodipine.  Follow pressures.  Follow metabolic panel.   Orders: -     Basic metabolic panel  Hypercholesterolemia Assessment & Plan: Continue pravastatin. Follow lipid panel and liver function tests.   Orders: -     Hepatic function panel -     Lipid panel -     Lipid panel; Future -     Hepatic function panel; Future  Type 2 diabetes mellitus with hyperglycemia, without long-term current use of insulin (HCC) Assessment &  Plan: Low carb diet and exercise given elevated blood sugars.  On no medication.    Follow met b and a1c.   Lab Results  Component Value Date   HGBA1C 6.4 07/24/2022    Orders: -     Hemoglobin A1c -     Microalbumin / creatinine urine ratio -     Basic metabolic panel; Future -     Hemoglobin A1c; Future  Stress Assessment & Plan: On lexapro 20mg .  Appears to be doing relatively well.  Continue current medication.     Post-nasal drainage Assessment & Plan: Steroid nasal spray as directed.  Follow.    Other orders -     Escitalopram Oxalate; Take 1 tablet (20 mg total) by mouth daily.  Dispense: 90 tablet; Refill: 1     Dale Glide, MD

## 2022-07-24 NOTE — Assessment & Plan Note (Signed)
Physical today 07/24/22.  Mammogram 04/26/22 - Birads I.  Colonoscopy 02/2014 - normal.  Had recommended f/u colonoscopy 5 years. Saw Dr Servando Snare 02/2020 - no follow up colonoscopy at that time.

## 2022-07-29 ENCOUNTER — Encounter: Payer: Self-pay | Admitting: Internal Medicine

## 2022-07-29 DIAGNOSIS — R0982 Postnasal drip: Secondary | ICD-10-CM | POA: Insufficient documentation

## 2022-07-29 NOTE — Assessment & Plan Note (Signed)
CXR - Stable nonspecific reticulonodular increased interstitial opacity with basilar predominance since November. Favor chronic interstitial lung disease. Lung volumes remain normal. And dedicated high-resolution Chest CT might be valuable to further characterize. Discussed.  Schedule HrCT scan chest.

## 2022-07-29 NOTE — Assessment & Plan Note (Signed)
On lexapro 20mg.  Appears to be doing relatively well.  Continue current medication.   

## 2022-07-29 NOTE — Assessment & Plan Note (Signed)
Continue pravastatin.  Follow lipid panel and liver function tests.   

## 2022-07-29 NOTE — Assessment & Plan Note (Signed)
Blood pressure as outlined.  Continue micardis and amlodipine.  Follow pressures.  Follow metabolic panel.  

## 2022-07-29 NOTE — Assessment & Plan Note (Signed)
Steroid nasal spray as directed.  Follow.  °

## 2022-07-29 NOTE — Assessment & Plan Note (Signed)
Low carb diet and exercise given elevated blood sugars.  On no medication.   Follow met b and a1c.   Lab Results  Component Value Date   HGBA1C 6.4 07/24/2022

## 2022-08-08 ENCOUNTER — Ambulatory Visit
Admission: RE | Admit: 2022-08-08 | Discharge: 2022-08-08 | Disposition: A | Discharge status: Home / Self Care | Payer: Medicare PPO | Source: Ambulatory Visit | Attending: Internal Medicine | Admitting: Internal Medicine

## 2022-08-08 DIAGNOSIS — R9389 Abnormal findings on diagnostic imaging of other specified body structures: Secondary | ICD-10-CM | POA: Insufficient documentation

## 2022-08-08 DIAGNOSIS — J479 Bronchiectasis, uncomplicated: Secondary | ICD-10-CM | POA: Diagnosis not present

## 2022-08-08 DIAGNOSIS — R918 Other nonspecific abnormal finding of lung field: Secondary | ICD-10-CM | POA: Diagnosis not present

## 2022-08-08 DIAGNOSIS — J84112 Idiopathic pulmonary fibrosis: Secondary | ICD-10-CM | POA: Diagnosis not present

## 2022-08-14 ENCOUNTER — Telehealth: Payer: Self-pay | Admitting: Internal Medicine

## 2022-08-14 DIAGNOSIS — R9389 Abnormal findings on diagnostic imaging of other specified body structures: Secondary | ICD-10-CM

## 2022-08-14 NOTE — Telephone Encounter (Signed)
Pulmonary referral placed.

## 2022-08-14 NOTE — Telephone Encounter (Signed)
Pt called Lori Guzman CMA back. Note from provider regarding pt CT scan was read to her. Pt aware and understood. Pt stated that she's doing ok. She gets a cough here and there, but maybe its a drainage in the throat or allergies. Pt agree for the pulmonary referral.

## 2022-08-15 ENCOUNTER — Encounter: Payer: Self-pay | Admitting: Internal Medicine

## 2022-08-17 ENCOUNTER — Encounter: Payer: Self-pay | Admitting: Internal Medicine

## 2022-08-17 NOTE — Telephone Encounter (Signed)
Duplicate message. 

## 2022-08-20 NOTE — Telephone Encounter (Signed)
Called Lori Guzman.  Left message on machine.  She had questions about CT.  Per note, already has appt with pulmonary.  CT scan reveals changes that appear to be related to inflammation/scarring .  Want pulmonary to evaluate - assess lung function and help with further evaluation and treatment.  Let me know if further questions.

## 2022-08-21 NOTE — Telephone Encounter (Signed)
Patient aware of below.

## 2022-09-20 ENCOUNTER — Institutional Professional Consult (permissible substitution) (HOSPITAL_BASED_OUTPATIENT_CLINIC_OR_DEPARTMENT_OTHER): Payer: Medicare PPO | Admitting: Pulmonary Disease

## 2022-10-19 ENCOUNTER — Encounter (HOSPITAL_BASED_OUTPATIENT_CLINIC_OR_DEPARTMENT_OTHER): Payer: Self-pay | Admitting: Pulmonary Disease

## 2022-10-19 ENCOUNTER — Ambulatory Visit (INDEPENDENT_AMBULATORY_CARE_PROVIDER_SITE_OTHER): Payer: Medicare PPO | Admitting: Pulmonary Disease

## 2022-10-19 VITALS — BP 128/70 | HR 74 | Resp 16 | Ht 64.0 in | Wt 177.8 lb

## 2022-10-19 DIAGNOSIS — J42 Unspecified chronic bronchitis: Secondary | ICD-10-CM

## 2022-10-19 NOTE — Progress Notes (Signed)
Subjective:   PATIENT ID: Lori Guzman GENDER: female DOB: [birth year], MRN: [id number]  Chief Complaint  Patient presents with   Consult    Abnormal CT    Reason for Visit: New consult for abnormal CT  [name] is a 82 year old female never smoker with nasal polyps s/p removal, HTN, HLD, allergic rhinitis, GERD, DM2 who presents for evaluation for abnormal CT.  She reports pneumonia in November 2023 and treated for presumed pneumonia and had persistent fatigue and mixed cough that lasted over a month. She recovered with no respiratory symptoms that she called. In June she presented to her PCP for cough and wheezing and was treated with azithromycin and tessalon perles. No longer having symptoms except for cough associated with sinus drainage. Denies shortness of breath or wheezing. She will be triggered after mowing her 2 acre yard including yesterday.  Occasional has reflux for abdominal pain and takes tums once a month especially with certain foods. Prior to all this she denies any significant respiratory illnesses except in 2020 for possible covid or flu that left her ill for a few weeks. Previously required allergy shots throughout her 30s-40s. Multiple allergies including animal dander, grass etc  Social History: Never smoker Retired Surveyor, minerals exposures: Asbestosis  I have personally reviewed patient's past medical/family/social history, allergies, current medications.  Past Medical History:  Diagnosis Date   Allergy    reoccuring problems   GERD (gastroesophageal reflux disease)    History of colon polyps    adenomatous   Hyperlipidemia    Hypertension      Family History  Problem Relation Age of Onset   Heart disease Mother    CVA Mother    Hypertension Mother    Alzheimer's disease Mother    Esophageal cancer Father    Breast cancer Neg Hx    Colon cancer Neg Hx      Social History   Occupational History   Not on file   Tobacco Use   Smoking status: Never    Passive exposure: Past   Smokeless tobacco: Never  Vaping Use   Vaping status: Never Used  Substance and Sexual Activity   Alcohol use: No    Alcohol/week: 0.0 standard drinks of alcohol   Drug use: No   Sexual activity: Not on file    Allergies  Allergen Reactions   Other Itching and Dermatitis    Seasonal allergies     Outpatient Medications Prior to Visit  Medication Sig Dispense Refill   amLODipine (NORVASC) 5 MG tablet TAKE 1 TABLET (5 MG TOTAL) BY MOUTH DAILY. 90 tablet 1   aspirin EC 81 MG tablet Take 81 mg by mouth daily.     escitalopram (LEXAPRO) 20 MG tablet Take 1 tablet (20 mg total) by mouth daily. 90 tablet 1   mometasone (ELOCON) 0.1 % cream Apply 1 Application topically daily as needed. Appear pea sized ( or less) amount to external ear, and slightly in canal daily as needed for itching 15 g 1   Multiple Vitamins-Minerals (ICAPS AREDS FORMULA PO) Take by mouth.     nystatin cream (MYCOSTATIN) Apply 1 Application topically 2 (two) times daily. 30 g 0   pravastatin (PRAVACHOL) 10 MG tablet TAKE ONE TABLET EVERY MONDAY, WEDNESDAY AND FRIDAY 39 tablet 1   telmisartan (MICARDIS) 80 MG tablet TAKE 1 TABLET BY MOUTH EVERY DAY 90 tablet 3   Omega-3 Fatty Acids (FISH OIL) 300 MG CAPS Take by mouth.  No facility-administered medications prior to visit.    Review of Systems  Constitutional:  Negative for chills, diaphoresis, fever, malaise/fatigue and weight loss.  HENT:  Positive for congestion.   Respiratory:  Positive for cough. Negative for hemoptysis, sputum production, shortness of breath and wheezing.   Cardiovascular:  Negative for chest pain, palpitations and leg swelling.     Objective:   Vitals:   10/19/22 1303  BP: 128/70  Pulse: 74  Resp: 16  SpO2: 98%  Weight: 177 lb 12.8 oz (80.6 kg)  Height: 5\' 4"  (1.626 m)   SpO2: 98 %  Physical Exam: General: Well-appearing, no acute distress HENT: [place],  AT Eyes: EOMI, no scleral icterus Respiratory: Clear to auscultation bilaterally.  No crackles, wheezing or rales Cardiovascular: RRR, -M/R/G, no JVD Extremities:-Edema,-tenderness Neuro: AAO x4, CNII-XII grossly intact Psych: Normal mood, normal affect  Data Reviewed:  Imaging: CT Chest 08/08/22 - Mild bibasilar irregular peripheral interstitial opacity septal thickening. No bronchiectasis or honeycoming  PFT: None on file  Labs: CBC    Component Value Date/Time   WBC 5.8 03/20/2022 0816   RBC 4.27 03/20/2022 0816   HGB 13.5 03/20/2022 0816   HCT 39.4 03/20/2022 0816   PLT 298.0 03/20/2022 0816   MCV 92.3 03/20/2022 0816   MCHC 34.3 03/20/2022 0816   RDW 13.8 03/20/2022 0816   LYMPHSABS 2.0 03/20/2022 0816   MONOABS 0.5 03/20/2022 0816   EOSABS 0.2 03/20/2022 0816   BASOSABS 0.1 03/20/2022 0816        Assessment & Plan:   Discussion: 82 year old female never smoker with nasal polyps s/p removal, HTN, HLD, allergic rhinitis, GERD, DM2 who presents for evaluation for abnormal CT. Asymptomatic. Reviewed CT scans with mild fibrotic changes. 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 Atypical for IPF. Likely related to prior prolonged infections in setting of suspected asthmatic bronchitis. Currently asymptomatic --Recommend infection prevention including hand hygiene, up-to-date vaccinations, avoid sick contacts and early treatment --If you do develop respiratory illness, would be reasonable to start short acting inhalers (albuterol) and ICS/LABA during active illness to minimize symptoms --Discussed pulmonary function tests but since she is asymptomatic, would not change our current plan --Could consider CT scan in 1 year or sooner if symptoms worsen  Health Maintenance Immunization History  Administered Date(s) Administered   Fluad Quad(high Dose 65+)  01/13/2020, 10/30/2021   Influenza Split 02/29/2012   Influenza, High Dose Seasonal PF 12/05/2016, 12/29/2018   Influenza,inj,Quad PF,6+ Mos 01/19/2013, 11/30/2013, 12/09/2014, 11/08/2017   Influenza-Unspecified 11/15/2015, 11/12/2020   PFIZER(Purple Top)SARS-COV-2 Vaccination 04/01/2019, 04/27/2019   Pneumococcal Conjugate-13 08/09/2014   Pneumococcal Polysaccharide-23 06/09/2012   CT Lung Screen - not qualified  No orders of the defined types were placed in this encounter. No orders of the defined types were placed in this encounter.   Return in about 1 year (around 10/19/2023).  I have spent a total time of 45-minutes on the day of the appointment reviewing prior documentation, coordinating care and discussing medical diagnosis and plan with the patient/family. Imaging, labs and tests included in this note have been reviewed and interpreted independently by me.  Tamyah Cutbirth Mechele Collin, MD [facility] 10/19/2022 1:53 PM  Office Number [phone]

## 2022-10-19 NOTE — Patient Instructions (Addendum)
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 Atypical for IPF. Likely related to prior prolonged infections in setting of suspected asthmatic bronchitis. Currently asymptomatic --Recommend infection prevention including hand hygiene, up-to-date vaccinations, avoid sick contacts and early treatment --If you do develop respiratory illness, would be reasonable to start short acting inhalers (albuterol) and ICS/LABA during active illness to minimize symptoms --Discussed pulmonary function tests but since she is asymptomatic, would not change our current plan --Could consider CT scan in 1 year or sooner if symptoms worsen

## 2022-11-20 ENCOUNTER — Other Ambulatory Visit (INDEPENDENT_AMBULATORY_CARE_PROVIDER_SITE_OTHER): Payer: Medicare PPO

## 2022-11-20 DIAGNOSIS — E7800 Pure hypercholesterolemia, unspecified: Secondary | ICD-10-CM

## 2022-11-20 DIAGNOSIS — E1165 Type 2 diabetes mellitus with hyperglycemia: Secondary | ICD-10-CM | POA: Diagnosis not present

## 2022-11-20 LAB — HEPATIC FUNCTION PANEL
ALT: 20 U/L (ref 0–35)
AST: 15 U/L (ref 0–37)
Albumin: 4.2 g/dL (ref 3.5–5.2)
Alkaline Phosphatase: 56 U/L (ref 39–117)
Bilirubin, Direct: 0.3 mg/dL (ref 0.0–0.3)
Total Bilirubin: 1.7 mg/dL — ABNORMAL HIGH (ref 0.2–1.2)
Total Protein: 6.6 g/dL (ref 6.0–8.3)

## 2022-11-20 LAB — BASIC METABOLIC PANEL
BUN: 17 mg/dL (ref 6–23)
CO2: 30 meq/L (ref 19–32)
Calcium: 9.4 mg/dL (ref 8.4–10.5)
Chloride: 105 meq/L (ref 96–112)
Creatinine, Ser: 0.82 mg/dL (ref 0.40–1.20)
GFR: 67.1 mL/min (ref >60.00)
Glucose, Bld: 121 mg/dL — ABNORMAL HIGH (ref 70–99)
Potassium: 4.3 meq/L (ref 3.5–5.1)
Sodium: 142 meq/L (ref 135–145)

## 2022-11-20 LAB — LIPID PANEL
Cholesterol: 123 mg/dL (ref 0–200)
HDL: 55.6 mg/dL (ref >39.00)
LDL Cholesterol: 58 mg/dL (ref 0–99)
NonHDL: 67.38
Total CHOL/HDL Ratio: 2
Triglycerides: 46 mg/dL (ref 0.0–149.0)
VLDL: 9.2 mg/dL (ref 0.0–40.0)

## 2022-11-20 LAB — HEMOGLOBIN A1C: Hgb A1c MFr Bld: 6.8 % — ABNORMAL HIGH (ref 4.6–6.5)

## 2022-11-23 ENCOUNTER — Ambulatory Visit: Payer: Medicare PPO | Admitting: Internal Medicine

## 2022-11-23 ENCOUNTER — Telehealth: Payer: Self-pay

## 2022-11-23 ENCOUNTER — Other Ambulatory Visit: Payer: Self-pay | Admitting: Internal Medicine

## 2022-11-23 VITALS — BP 122/72 | HR 90 | Temp 98.0°F | Resp 16 | Ht 64.0 in | Wt 180.0 lb

## 2022-11-23 DIAGNOSIS — F439 Reaction to severe stress, unspecified: Secondary | ICD-10-CM

## 2022-11-23 DIAGNOSIS — I4891 Unspecified atrial fibrillation: Secondary | ICD-10-CM

## 2022-11-23 DIAGNOSIS — E7800 Pure hypercholesterolemia, unspecified: Secondary | ICD-10-CM

## 2022-11-23 DIAGNOSIS — I10 Essential (primary) hypertension: Secondary | ICD-10-CM | POA: Diagnosis not present

## 2022-11-23 DIAGNOSIS — E1165 Type 2 diabetes mellitus with hyperglycemia: Secondary | ICD-10-CM | POA: Diagnosis not present

## 2022-11-23 DIAGNOSIS — I499 Cardiac arrhythmia, unspecified: Secondary | ICD-10-CM | POA: Diagnosis not present

## 2022-11-23 MED ORDER — TELMISARTAN 80 MG PO TABS: 80.0000 mg | ORAL_TABLET | Freq: Every day | ORAL | 3 refills | Status: DC

## 2022-11-23 MED ORDER — METOPROLOL SUCCINATE ER 25 MG PO TB24: 25.0000 mg | ORAL_TABLET | Freq: Every day | ORAL | 2 refills | Status: DC

## 2022-11-23 MED ORDER — APIXABAN 5 MG PO TABS: 5.0000 mg | ORAL_TABLET | Freq: Two times a day (BID) | ORAL | 2 refills | Status: DC

## 2022-11-23 MED ORDER — PRAVASTATIN SODIUM 10 MG PO TABS: ORAL_TABLET | ORAL | 1 refills | Status: DC

## 2022-11-23 MED ORDER — AMLODIPINE BESYLATE 5 MG PO TABS: 5.0000 mg | ORAL_TABLET | Freq: Every day | ORAL | 1 refills | Status: DC

## 2022-11-23 MED ORDER — ESCITALOPRAM OXALATE 20 MG PO TABS: 20.0000 mg | ORAL_TABLET | Freq: Every day | ORAL | 1 refills | Status: DC

## 2022-11-23 NOTE — Progress Notes (Signed)
Subjective:    Patient ID: Lori Guzman, female    DOB: [birth year], 82 y.o.   MRN: [id number]  Patient here for  Chief Complaint  Patient presents with   Medical Management of Chronic Issues    HPI Here for a scheduled follow up - follow up regarding diabetes, hypertension and hypercholesterolemia.  Continues on lexapro. Saw pulmonary 10/19/22 - recommended hold on PFTs.  Consider repeat CT scan in one year. She feels her breathing is stable.  No increased cough or congestion.  No chest pain or sob.  No palpitations.  She reports - when asked, that she has noticed over the last couple of weeks - when checking her pulse - it has been a little higher.  No actual symptoms.  Eating.  No nausea or vomiting.  No abdominal pain or bowel change reported.     Past Medical History:  Diagnosis Date   Allergy    reoccuring problems   GERD (gastroesophageal reflux disease)    History of colon polyps    adenomatous   Hyperlipidemia    Hypertension    Past Surgical History:  Procedure Laterality Date   BARTHOLIN GLAND CYST EXCISION     CHOLECYSTECTOMY     NASAL POLYP SURGERY  1964   TONSILECTOMY/ADENOIDECTOMY WITH MYRINGOTOMY  1950   Family History  Problem Relation Age of Onset   Heart disease Mother    CVA Mother    Hypertension Mother    Alzheimer's disease Mother    Esophageal cancer Father    Breast cancer Neg Hx    Colon cancer Neg Hx    Social History   Socioeconomic History   Marital status: Widowed    Spouse name: Not on file   Number of children: Not on file   Years of education: Not on file   Highest education level: Associate degree: occupational, Scientist, product/process development, or vocational program  Occupational History   Not on file  Tobacco Use   Smoking status: Never    Passive exposure: Past   Smokeless tobacco: Never  Vaping Use   Vaping status: Never Used  Substance and Sexual Activity   Alcohol use: No    Alcohol/week: 0.0 standard drinks of alcohol   Drug use: No    Sexual activity: Not on file  Other Topics Concern   Not on file  Social History Narrative   She is married, has 3 children and is retired.   Social Determinants of Health   Financial Resource Strain: Low Risk  (11/23/2022)   Overall Financial Resource Strain (CARDIA)    Difficulty of Paying Living Expenses: Not very hard  Food Insecurity: No Food Insecurity (11/23/2022)   Hunger Vital Sign    Worried About Running Out of Food in the Last Year: Never true    Ran Out of Food in the Last Year: Never true  Transportation Needs: No Transportation Needs (11/23/2022)   PRAPARE - Administrator, Civil Service (Medical): No    Lack of Transportation (Non-Medical): No  Physical Activity: Insufficiently Active (11/23/2022)   Exercise Vital Sign    Days of Exercise per Week: 2 days    Minutes of Exercise per Session: 20 min  Stress: No Stress Concern Present (11/23/2022)   [employer] of Occupational Health - Occupational Stress Questionnaire    Feeling of Stress : Not at all  Social Connections: Moderately Isolated (11/23/2022)   Social Connection and Isolation Panel [NHANES]    Frequency of Communication with Friends  and Family: More than three times a week    Frequency of Social Gatherings with Friends and Family: More than three times a week    Attends Religious Services: More than 4 times per year    Active Member of Clubs or Organizations: No    Attends Banker Meetings: Never    Marital Status: Widowed     Review of Systems  Constitutional:  Negative for appetite change and unexpected weight change.  HENT:  Negative for congestion and sinus pressure.   Respiratory:  Negative for cough, chest tightness and shortness of breath.   Cardiovascular:  Negative for chest pain and palpitations.  Gastrointestinal:  Negative for abdominal pain, diarrhea, nausea and vomiting.  Genitourinary:  Negative for difficulty urinating and dysuria.  Musculoskeletal:   Negative for joint swelling and myalgias.  Skin:  Negative for color change and rash.  Neurological:  Negative for dizziness and headaches.  Psychiatric/Behavioral:  Negative for agitation and dysphoric mood.        Objective:     BP 122/72   Pulse 90   Temp 98 F (36.7 C)   Resp 16   Ht 5\' 4"  (1.626 m)   Wt 180 lb (81.6 kg)   LMP 02/29/1992   SpO2 98%   BMI 30.90 kg/m  Wt Readings from Last 3 Encounters:  11/23/22 180 lb (81.6 kg)  10/19/22 177 lb 12.8 oz (80.6 kg)  07/24/22 174 lb (78.9 kg)    Physical Exam Vitals reviewed.  Constitutional:      General: She is not in acute distress.    Appearance: Normal appearance.  HENT:     Head: Normocephalic and atraumatic.     Right Ear: External ear normal.     Left Ear: External ear normal.  Eyes:     General: No scleral icterus.       Right eye: No discharge.        Left eye: No discharge.     Conjunctiva/sclera: Conjunctivae normal.  Neck:     Thyroid: No thyromegaly.  Cardiovascular:     Comments: Irregularly irregular.  Pulse 92-96 Pulmonary:     Effort: No respiratory distress.     Breath sounds: Normal breath sounds. No wheezing.  Abdominal:     General: Bowel sounds are normal.     Palpations: Abdomen is soft.     Tenderness: There is no abdominal tenderness.  Musculoskeletal:        General: No swelling or tenderness.     Cervical back: Neck supple. No tenderness.  Lymphadenopathy:     Cervical: No cervical adenopathy.  Skin:    Findings: No erythema or rash.  Neurological:     Mental Status: She is alert.  Psychiatric:        Mood and Affect: Mood normal.        Behavior: Behavior normal.      Outpatient Encounter Medications as of 11/23/2022  Medication Sig   apixaban (ELIQUIS) 5 MG TABS tablet Take 1 tablet (5 mg total) by mouth 2 (two) times daily.   aspirin EC 81 MG tablet Take 81 mg by mouth daily.   mometasone (ELOCON) 0.1 % cream Apply 1 Application topically daily as needed. Appear  pea sized ( or less) amount to external ear, and slightly in canal daily as needed for itching   Multiple Vitamins-Minerals (ICAPS AREDS FORMULA PO) Take by mouth.   nystatin cream (MYCOSTATIN) Apply 1 Application topically 2 (two) times daily.   [  DISCONTINUED] metoprolol succinate (TOPROL-XL) 25 MG 24 hr tablet Take 1 tablet (25 mg total) by mouth daily.   amLODipine (NORVASC) 5 MG tablet Take 1 tablet (5 mg total) by mouth daily.   escitalopram (LEXAPRO) 20 MG tablet Take 1 tablet (20 mg total) by mouth daily.   pravastatin (PRAVACHOL) 10 MG tablet TAKE ONE TABLET EVERY MONDAY, WEDNESDAY AND FRIDAY   telmisartan (MICARDIS) 80 MG tablet Take 1 tablet (80 mg total) by mouth daily.   [DISCONTINUED] amLODipine (NORVASC) 5 MG tablet TAKE 1 TABLET (5 MG TOTAL) BY MOUTH DAILY.   [DISCONTINUED] escitalopram (LEXAPRO) 20 MG tablet Take 1 tablet (20 mg total) by mouth daily.   [DISCONTINUED] pravastatin (PRAVACHOL) 10 MG tablet TAKE ONE TABLET EVERY MONDAY, WEDNESDAY AND FRIDAY   [DISCONTINUED] telmisartan (MICARDIS) 80 MG tablet TAKE 1 TABLET BY MOUTH EVERY DAY   No facility-administered encounter medications on file as of 11/23/2022.     Lab Results  Component Value Date   WBC 5.8 03/20/2022   HGB 13.5 03/20/2022   HCT 39.4 03/20/2022   PLT 298.0 03/20/2022   GLUCOSE 121 (H) 11/20/2022   CHOL 123 11/20/2022   TRIG 46.0 11/20/2022   HDL 55.60 11/20/2022   LDLCALC 58 11/20/2022   ALT 20 11/20/2022   AST 15 11/20/2022   NA 142 11/20/2022   K 4.3 11/20/2022   CL 105 11/20/2022   CREATININE 0.82 11/20/2022   BUN 17 11/20/2022   CO2 30 11/20/2022   TSH 2.85 03/20/2022   HGBA1C 6.8 (H) 11/20/2022   MICROALBUR 2.0 (H) 07/24/2022    CT Chest High Resolution  Result Date: 08/12/2022 CLINICAL DATA:  Abnormal x-ray, history of pneumonia EXAM: CT CHEST WITHOUT CONTRAST TECHNIQUE: Multidetector CT imaging of the chest was performed following the standard protocol without intravenous contrast.  High resolution imaging of the lungs, as well as inspiratory and expiratory imaging, was performed. RADIATION DOSE REDUCTION: This exam was performed according to the departmental dose-optimization program which includes automated exposure control, adjustment of the mA and/or kV according to patient size and/or use of iterative reconstruction technique. COMPARISON:  None Available. FINDINGS: Cardiovascular: Aortic atherosclerosis. Normal heart size. Left coronary artery calcifications. No pericardial effusion. Mediastinum/Nodes: No enlarged mediastinal, hilar, or axillary lymph nodes. Thyroid gland, trachea, and esophagus demonstrate no significant findings. Lungs/Pleura: Diffuse bilateral bronchial wall thickening. Mild, bibasilar irregular peripheral interstitial opacity septal thickening clear evidence of bronchiectasis. Additional bland appearing, bandlike scarring of the lingula (series 5, image 160). Lobular air trapping expiratory phase. No pleural effusion or pneumothorax. Upper Abdomen: No acute abnormality. Musculoskeletal: No chest wall abnormality. No acute osseous findings. IMPRESSION: 1. Mild, bibasilar irregular peripheral interstitial opacity septal thickening. No evidence of bronchiectasis or honeycombing. Findings may reflect mild interstitial lung disease in an indeterminate for UIP pattern or alternately bland sequelae of prior infection or aspiration. Consider follow-up examination in 1 year to assess for stability. Findings are indeterminate for UIP per consensus guidelines: Diagnosis of Idiopathic Pulmonary Fibrosis: An Official ATS/ERS/JRS/ALAT Clinical Practice Guideline. Am Rosezetta Schlatter Crit Care Med Vol 198, Iss 5, [phone], Oct 13 2016. 2. Diffuse bilateral bronchial wall thickening, consistent with nonspecific infectious or inflammatory bronchitis. 3. Lobular air trapping on expiratory phase, consistent with small airways disease. 4. Coronary artery disease. Aortic Atherosclerosis  (ICD10-I70.0). Electronically Signed   By: Jearld Lesch M.D.   On: 08/12/2022 16:53       Assessment & Plan:  Hypercholesterolemia Assessment & Plan: Continue pravastatin. Follow lipid panel and liver function  tests.   Orders: -     Lipid panel; Future -     Hepatic function panel; Future  Type 2 diabetes mellitus with hyperglycemia, without long-term current use of insulin (HCC) Assessment & Plan: Low carb diet and exercise given elevated blood sugars.  On no medication.   Follow met b and a1c.   Lab Results  Component Value Date   HGBA1C 6.8 (H) 11/20/2022    Orders: -     Hemoglobin A1c; Future  Primary hypertension Assessment & Plan: Blood pressure as outlined.  Continue micardis and amlodipine.  Add toprol XL for rate control. Follow pressures.  Follow metabolic panel.   Orders: -     CBC with Differential/Platelet; Future -     Basic metabolic panel; Future -     TSH; Future  Irregular heart beat Assessment & Plan: Noted on exam.  EKG - AFib.  See note.   Orders: -     EKG 12-Lead  Hyperbilirubinemia Assessment & Plan: Has varied intermittently - previously.  Previous abdominal ultrasound ok.  Follow liver panel.   Orders: -     Hepatic function panel; Future  Stress Assessment & Plan: On lexapro 20mg .  Appears to be doing relatively well.  Continue current medication.     Atrial fibrillation, unspecified type [facility]) Assessment & Plan: New onset atrial fibrillation.  Ventricular rate 90-100. EKG - afib - ventricular rate 100.  She denies any chest pain or sob.  Stays active.  Did notice a couple of weeks ago, when checking her pulse - was running higher than usual.  No symptoms.  Discussed atrial fibrillation and further w/up.  Recent labs wnl.  Start eliquis.  Blood pressure as outlined.  Start toprol XL 25mg  q day for better rate control.  Follow heart rate and pressure.  Consult cardiology.  Discussed ECHO and further w/up, treatment and evaluation.    Orders: -     Ambulatory referral to Cardiology  Other orders -     amLODIPine Besylate; Take 1 tablet (5 mg total) by mouth daily.  Dispense: 90 tablet; Refill: 1 -     Escitalopram Oxalate; Take 1 tablet (20 mg total) by mouth daily.  Dispense: 90 tablet; Refill: 1 -     Pravastatin Sodium; TAKE ONE TABLET EVERY MONDAY, WEDNESDAY AND FRIDAY  Dispense: 39 tablet; Refill: 1 -     Telmisartan; Take 1 tablet (80 mg total) by mouth daily.  Dispense: 90 tablet; Refill: 3 -     Apixaban; Take 1 tablet (5 mg total) by mouth 2 (two) times daily.  Dispense: 60 tablet; Refill: 2     Dale Payson, MD

## 2022-11-23 NOTE — Telephone Encounter (Signed)
Medication Samples have been provided to the patient.  Drug name: Eliquis       Strength: 5 mg        Qty: 2 boxes  LOT: [id number]  Exp.Date: 07/13/2023  Dosing instructions: 1 TABLET BY MOUTH TWICE DAILY.  The patient has been instructed regarding the correct time, dose, and frequency of taking this medication, including desired effects and most common side effects.   Lori Guzman 12:19 PM 11/23/2022

## 2022-11-24 ENCOUNTER — Encounter: Payer: Self-pay | Admitting: Internal Medicine

## 2022-11-24 DIAGNOSIS — I4891 Unspecified atrial fibrillation: Secondary | ICD-10-CM | POA: Insufficient documentation

## 2022-11-24 NOTE — Assessment & Plan Note (Signed)
On lexapro 20mg .  Appears to be doing relatively well.  Continue current medication.

## 2022-11-24 NOTE — Assessment & Plan Note (Signed)
Low carb diet and exercise given elevated blood sugars.  On no medication.   Follow met b and a1c.   Lab Results  Component Value Date   HGBA1C 6.8 (H) 11/20/2022

## 2022-11-24 NOTE — Assessment & Plan Note (Signed)
Noted on exam.  EKG - AFib.  See note.

## 2022-11-24 NOTE — Assessment & Plan Note (Signed)
Blood pressure as outlined.  Continue micardis and amlodipine.  Add toprol XL for rate control. Follow pressures.  Follow metabolic panel.

## 2022-11-24 NOTE — Assessment & Plan Note (Signed)
Has varied intermittently - previously.  Previous abdominal ultrasound ok.  Follow liver panel.

## 2022-11-24 NOTE — Assessment & Plan Note (Signed)
Continue pravastatin. Follow lipid panel and liver function tests.  

## 2022-11-24 NOTE — Assessment & Plan Note (Signed)
New onset atrial fibrillation.  Ventricular rate 90-100. EKG - afib - ventricular rate 100.  She denies any chest pain or sob.  Stays active.  Did notice a couple of weeks ago, when checking her pulse - was running higher than usual.  No symptoms.  Discussed atrial fibrillation and further w/up.  Recent labs wnl.  Start eliquis.  Blood pressure as outlined.  Start toprol XL 25mg  q day for better rate control.  Follow heart rate and pressure.  Consult cardiology.  Discussed ECHO and further w/up, treatment and evaluation.

## 2022-11-26 NOTE — Telephone Encounter (Signed)
Patient aware of below. She says she has been taking aspirin for years but does not recall a specific reason why she was taking it. She has checked heart rate but not bp. Did not give readings but said heart rate is much slower. Has not heard from cardiology yet. She will continue her amlodipine with metoprolol and eliquis.

## 2022-11-26 NOTE — Telephone Encounter (Signed)
She is to continue on the amlodipine along with the metoprolol.  Also, confirm why she was on aspirin - if there was a specific reason why she started the aspirin.  Confirm she is doing ok and see if she has checked her heart rate or blood pressure since she was here.

## 2022-11-26 NOTE — Telephone Encounter (Signed)
We started her on eliquis and metoprolol on Friday. I will let her know to continue her amlodipine. Does she need to continue her baby aspirin since she is taking eliquis now?

## 2022-11-26 NOTE — Addendum Note (Signed)
Addended by: Charm Barges on: 11/26/2022 11:05 AM   Modules accepted: Orders

## 2022-11-26 NOTE — Telephone Encounter (Signed)
Continue baby aspirin for now with eliquis. Have her spot check her pressure and blood pressure.  Let us know if any problem.

## 2022-11-29 ENCOUNTER — Encounter: Payer: Self-pay | Admitting: Internal Medicine

## 2022-11-29 NOTE — Telephone Encounter (Signed)
Need to confirm if any pain in the eye.  Any vision change.  Given afib, would continue eliquis at dose she is doing.  Has she heard from cardiology?  Also, how are blood pressures and pulse

## 2022-11-30 NOTE — Telephone Encounter (Signed)
FYI update from patient

## 2022-11-30 NOTE — Telephone Encounter (Signed)
It sounded like she had a subconjunctival hemorrhage from what she had described.  This will reabsorb on its on.  With a subconjunctival hemorrhage, there is no pain and no vision change.  If she is having pain, then I recommend having someone look at it to confirm nothing more needs to be done.  (She had stated she was out of town).  Will f/u regarding cardiology referral.

## 2022-12-19 DIAGNOSIS — L281 Prurigo nodularis: Secondary | ICD-10-CM | POA: Diagnosis not present

## 2022-12-19 DIAGNOSIS — L2089 Other atopic dermatitis: Secondary | ICD-10-CM | POA: Diagnosis not present

## 2022-12-21 ENCOUNTER — Other Ambulatory Visit (INDEPENDENT_AMBULATORY_CARE_PROVIDER_SITE_OTHER): Payer: Medicare PPO

## 2022-12-21 LAB — HEPATIC FUNCTION PANEL
ALT: 15 U/L (ref 0–35)
AST: 14 U/L (ref 0–37)
Albumin: 4.2 g/dL (ref 3.5–5.2)
Alkaline Phosphatase: 57 U/L (ref 39–117)
Bilirubin, Direct: 0.3 mg/dL (ref 0.0–0.3)
Total Bilirubin: 1.5 mg/dL — ABNORMAL HIGH (ref 0.2–1.2)
Total Protein: 6.2 g/dL (ref 6.0–8.3)

## 2022-12-25 ENCOUNTER — Encounter: Payer: Self-pay | Admitting: Internal Medicine

## 2022-12-25 NOTE — Telephone Encounter (Signed)
Please call her and notify - I am not aware of a significant interaction with her medications. We will follow.

## 2022-12-26 NOTE — Telephone Encounter (Signed)
LMTCB. When she returns call please give message below.

## 2022-12-26 NOTE — Telephone Encounter (Signed)
Patient just returned phone call. I read her the message. She said she will follow up.

## 2022-12-26 NOTE — Telephone Encounter (Signed)
Noted  

## 2023-01-28 ENCOUNTER — Encounter: Payer: Self-pay | Admitting: Internal Medicine

## 2023-01-28 ENCOUNTER — Ambulatory Visit (INDEPENDENT_AMBULATORY_CARE_PROVIDER_SITE_OTHER): Payer: Medicare PPO | Admitting: Internal Medicine

## 2023-01-28 VITALS — BP 126/72 | HR 88 | Temp 97.8°F | Resp 16 | Ht 64.0 in | Wt 183.0 lb

## 2023-01-28 DIAGNOSIS — E7800 Pure hypercholesterolemia, unspecified: Secondary | ICD-10-CM | POA: Diagnosis not present

## 2023-01-28 DIAGNOSIS — E1165 Type 2 diabetes mellitus with hyperglycemia: Secondary | ICD-10-CM | POA: Diagnosis not present

## 2023-01-28 DIAGNOSIS — F439 Reaction to severe stress, unspecified: Secondary | ICD-10-CM

## 2023-01-28 DIAGNOSIS — H2513 Age-related nuclear cataract, bilateral: Secondary | ICD-10-CM | POA: Diagnosis not present

## 2023-01-28 DIAGNOSIS — I10 Essential (primary) hypertension: Secondary | ICD-10-CM

## 2023-01-28 DIAGNOSIS — I4891 Unspecified atrial fibrillation: Secondary | ICD-10-CM | POA: Diagnosis not present

## 2023-01-28 DIAGNOSIS — H31092 Other chorioretinal scars, left eye: Secondary | ICD-10-CM | POA: Diagnosis not present

## 2023-01-28 NOTE — Assessment & Plan Note (Signed)
Low carb diet and exercise given elevated blood sugars.  On no medication.   Follow met b and a1c.   Lab Results  Component Value Date   HGBA1C 6.8 (H) 11/20/2022

## 2023-01-28 NOTE — Assessment & Plan Note (Signed)
Blood pressure as outlined.  Continue micardis and amlodipine.  Also on toprol XL now.  Follow pressures.  Follow metabolic panel.

## 2023-01-28 NOTE — Assessment & Plan Note (Signed)
Diagnosed with afib last visit. Placed on eliquis and metoprolol. Doing better. Feels better. Keep appt with cardiology next week.

## 2023-01-28 NOTE — Assessment & Plan Note (Signed)
Continue pravastatin. Follow lipid panel and liver function tests.  

## 2023-01-28 NOTE — Assessment & Plan Note (Signed)
On lexapro 20mg .  Increased stress. Discussed. Appears to be doing relatively well.  Continue current medication.

## 2023-01-28 NOTE — Progress Notes (Signed)
Subjective:    Patient ID: Lori Guzman, female    DOB: 1940-08-24, 82 y.o.   MRN: [id number]  Patient here for  Chief Complaint  Patient presents with   Medical Management of Chronic Issues    HPI Here for a scheduled follow up - follow up regarding diabetes, hypertension and hypercholesterolemia. Continues on lexapro. Saw pulmonary 10/19/22 - recommended hold on PFTs. Consider repeat CT scan in one year. Last visit, diagnosed with afib. Was started on eliquis. Also placed on tprol added to help with rate control and blood pressure. Reports feels better since starting. No increased heart rate or palpitations. No chest pain. Also continues on micardis and amlodipine. Seeing Dr Cheree Ditto. Mora Appl on nemluvio injections for severe eczema. Injections have helped her skin. No abdominal pain reported. Bowels overall relatively stable. Reports bowel movement most days now. Increased stress. Discussed. Overall she feels she is handling things relatively well.    Past Medical History:  Diagnosis Date   Allergy    reoccuring problems   GERD (gastroesophageal reflux disease)    History of colon polyps    adenomatous   Hyperlipidemia    Hypertension    Past Surgical History:  Procedure Laterality Date   BARTHOLIN GLAND CYST EXCISION     CHOLECYSTECTOMY     NASAL POLYP SURGERY  1964   TONSILECTOMY/ADENOIDECTOMY WITH MYRINGOTOMY  1950   Family History  Problem Relation Age of Onset   Heart disease Mother    CVA Mother    Hypertension Mother    Alzheimer's disease Mother    Esophageal cancer Father    Breast cancer Neg Hx    Colon cancer Neg Hx    Social History   Socioeconomic History   Marital status: Widowed    Spouse name: Not on file   Number of children: Not on file   Years of education: Not on file   Highest education level: Associate degree: academic program  Occupational History   Not on file  Tobacco Use   Smoking status: Never    Passive exposure: Past    Smokeless tobacco: Never  Vaping Use   Vaping status: Never Used  Substance and Sexual Activity   Alcohol use: No    Alcohol/week: 0.0 standard drinks of alcohol   Drug use: No   Sexual activity: Not on file  Other Topics Concern   Not on file  Social History Narrative   She is married, has 3 children and is retired.   Social Drivers of Corporate investment banker Strain: Low Risk  (01/24/2023)   Overall Financial Resource Strain (CARDIA)    Difficulty of Paying Living Expenses: Not very hard  Food Insecurity: No Food Insecurity (01/24/2023)   Hunger Vital Sign    Worried About Running Out of Food in the Last Year: Never true    Ran Out of Food in the Last Year: Never true  Transportation Needs: No Transportation Needs (01/24/2023)   PRAPARE - Administrator, Civil Service (Medical): No    Lack of Transportation (Non-Medical): No  Physical Activity: Insufficiently Active (01/24/2023)   Exercise Vital Sign    Days of Exercise per Week: 3 days    Minutes of Exercise per Session: 10 min  Stress: No Stress Concern Present (01/24/2023)   [employer] of Occupational Health - Occupational Stress Questionnaire    Feeling of Stress : Not at all  Social Connections: Moderately Isolated (01/24/2023)   Social Connection and Isolation Panel [  NHANES]    Frequency of Communication with Friends and Family: More than three times a week    Frequency of Social Gatherings with Friends and Family: More than three times a week    Attends Religious Services: More than 4 times per year    Active Member of [employer] or Organizations: No    Attends Banker Meetings: Never    Marital Status: Widowed     Review of Systems  Constitutional:  Negative for appetite change and unexpected weight change.  HENT:  Negative for congestion and sinus pressure.   Respiratory:  Negative for cough, chest tightness and shortness of breath.   Cardiovascular:  Negative for chest pain and  palpitations.  Gastrointestinal:  Negative for abdominal pain, diarrhea, nausea and vomiting.  Genitourinary:  Negative for difficulty urinating and dysuria.  Musculoskeletal:  Negative for joint swelling and myalgias.  Skin:  Negative for color change and rash.  Neurological:  Negative for dizziness and headaches.  Psychiatric/Behavioral:  Negative for agitation and dysphoric mood.        Objective:     BP 126/72   Pulse 88   Temp 97.8 F (36.6 C)   Resp 16   Ht 5\' 4"  (1.626 m)   Wt 183 lb (83 kg)   LMP 02/29/1992   SpO2 98%   BMI 31.41 kg/m  Wt Readings from Last 3 Encounters:  01/28/23 183 lb (83 kg)  11/23/22 180 lb (81.6 kg)  10/19/22 177 lb 12.8 oz (80.6 kg)    Physical Exam Vitals reviewed.  Constitutional:      General: She is not in acute distress.    Appearance: Normal appearance.  HENT:     Head: Normocephalic and atraumatic.     Right Ear: External ear normal.     Left Ear: External ear normal.  Eyes:     General: No scleral icterus.       Right eye: No discharge.        Left eye: No discharge.     Conjunctiva/sclera: Conjunctivae normal.  Neck:     Thyroid: No thyromegaly.  Cardiovascular:     Rate and Rhythm: Normal rate.     Comments: Irregular.  Pulmonary:     Effort: No respiratory distress.     Breath sounds: Normal breath sounds. No wheezing.  Abdominal:     General: Bowel sounds are normal.     Palpations: Abdomen is soft.     Tenderness: There is no abdominal tenderness.  Musculoskeletal:        General: No swelling or tenderness.     Cervical back: Neck supple. No tenderness.  Lymphadenopathy:     Cervical: No cervical adenopathy.  Skin:    Findings: No erythema or rash.  Neurological:     Mental Status: She is alert.  Psychiatric:        Mood and Affect: Mood normal.        Behavior: Behavior normal.      Outpatient Encounter Medications as of 01/28/2023  Medication Sig   amLODipine (NORVASC) 5 MG tablet Take 1 tablet (5  mg total) by mouth daily.   apixaban (ELIQUIS) 5 MG TABS tablet Take 1 tablet (5 mg total) by mouth 2 (two) times daily.   aspirin EC 81 MG tablet Take 81 mg by mouth daily.   escitalopram (LEXAPRO) 20 MG tablet Take 1 tablet (20 mg total) by mouth daily.   metoprolol succinate (TOPROL-XL) 25 MG 24 hr tablet TAKE 1  TABLET (25 MG TOTAL) BY MOUTH DAILY.   mometasone (ELOCON) 0.1 % cream Apply 1 Application topically daily as needed. Appear pea sized ( or less) amount to external ear, and slightly in canal daily as needed for itching   Multiple Vitamins-Minerals (ICAPS AREDS FORMULA PO) Take by mouth.   pravastatin (PRAVACHOL) 10 MG tablet TAKE ONE TABLET EVERY MONDAY, WEDNESDAY AND FRIDAY   telmisartan (MICARDIS) 80 MG tablet Take 1 tablet (80 mg total) by mouth daily.   [DISCONTINUED] nystatin cream (MYCOSTATIN) Apply 1 Application topically 2 (two) times daily.   No facility-administered encounter medications on file as of 01/28/2023.     Lab Results  Component Value Date   WBC 5.8 03/20/2022   HGB 13.5 03/20/2022   HCT 39.4 03/20/2022   PLT 298.0 03/20/2022   GLUCOSE 121 (H) 11/20/2022   CHOL 123 11/20/2022   TRIG 46.0 11/20/2022   HDL 55.60 11/20/2022   LDLCALC 58 11/20/2022   ALT 15 12/21/2022   AST 14 12/21/2022   NA 142 11/20/2022   K 4.3 11/20/2022   CL 105 11/20/2022   CREATININE 0.82 11/20/2022   BUN 17 11/20/2022   CO2 30 11/20/2022   TSH 2.85 03/20/2022   HGBA1C 6.8 (H) 11/20/2022   MICROALBUR 2.0 (H) 07/24/2022    CT Chest High Resolution Result Date: 08/12/2022 CLINICAL DATA:  Abnormal x-ray, history of pneumonia EXAM: CT CHEST WITHOUT CONTRAST TECHNIQUE: Multidetector CT imaging of the chest was performed following the standard protocol without intravenous contrast. High resolution imaging of the lungs, as well as inspiratory and expiratory imaging, was performed. RADIATION DOSE REDUCTION: This exam was performed according to the departmental dose-optimization  program which includes automated exposure control, adjustment of the mA and/or kV according to patient size and/or use of iterative reconstruction technique. COMPARISON:  None Available. FINDINGS: Cardiovascular: Aortic atherosclerosis. Normal heart size. Left coronary artery calcifications. No pericardial effusion. Mediastinum/Nodes: No enlarged mediastinal, hilar, or axillary lymph nodes. Thyroid gland, trachea, and esophagus demonstrate no significant findings. Lungs/Pleura: Diffuse bilateral bronchial wall thickening. Mild, bibasilar irregular peripheral interstitial opacity septal thickening clear evidence of bronchiectasis. Additional bland appearing, bandlike scarring of the lingula (series 5, image 160). Lobular air trapping expiratory phase. No pleural effusion or pneumothorax. Upper Abdomen: No acute abnormality. Musculoskeletal: No chest wall abnormality. No acute osseous findings. IMPRESSION: 1. Mild, bibasilar irregular peripheral interstitial opacity septal thickening. No evidence of bronchiectasis or honeycombing. Findings may reflect mild interstitial lung disease in an indeterminate for UIP pattern or alternately bland sequelae of prior infection or aspiration. Consider follow-up examination in 1 year to assess for stability. Findings are indeterminate for UIP per consensus guidelines: Diagnosis of Idiopathic Pulmonary Fibrosis: An Official ATS/ERS/JRS/ALAT Clinical Practice Guideline. Am Rosezetta Schlatter Crit Care Med Vol 198, Iss 5, [phone], Oct 13 2016. 2. Diffuse bilateral bronchial wall thickening, consistent with nonspecific infectious or inflammatory bronchitis. 3. Lobular air trapping on expiratory phase, consistent with small airways disease. 4. Coronary artery disease. Aortic Atherosclerosis (ICD10-I70.0). Electronically Signed   By: Jearld Lesch M.D.   On: 08/12/2022 16:53       Assessment & Plan:  Atrial fibrillation, unspecified type [facility]) Assessment & Plan: Diagnosed with afib last  visit. Placed on eliquis and metoprolol. Doing better. Feels better. Keep appt with cardiology next week.    Type 2 diabetes mellitus with hyperglycemia, without long-term current use of insulin (HCC) Assessment & Plan: Low carb diet and exercise given elevated blood sugars.  On no medication.   Follow  met b and a1c.   Lab Results  Component Value Date   HGBA1C 6.8 (H) 11/20/2022     Hypercholesterolemia Assessment & Plan: Continue pravastatin. Follow lipid panel and liver function tests.    Primary hypertension Assessment & Plan: Blood pressure as outlined.  Continue micardis and amlodipine.  Also on toprol XL now.  Follow pressures.  Follow metabolic panel.    Stress Assessment & Plan: On lexapro 20mg .  Increased stress. Discussed. Appears to be doing relatively well.  Continue current medication.        Dale Eminence, MD

## 2023-02-04 NOTE — Progress Notes (Unsigned)
Cardiology Office Note  Date:  02/05/2023   ID:  QWYNN ROCHER, DOB 1940-10-12, MRN [id number]  PCP:  Dale Layton, MD   Chief Complaint  Patient presents with   New Patient (Initial Visit)    Ref by Dr. Lorin Picket for A-Fib. Medications reviewed by the patient verbally. Patient c/o when lying down feels some irregular heart beats & shortness of breath with walking up an incline.     HPI:  [name] is a 82 year old woman with past medical history of Diabetes Hypertension Hyperlipidemia Diabetes Atrial fibrillation Who presents by referral from Dr. Dale Calabash for atrial fibrillation  She reports appreciating tachypalpitations laying down at night end of September into early October,  Seen by primary care in October EKG November 23, 2022 showing atrial fibrillation rate 100 bpm Started on Eliquis and metoprolol succinate 25 mg daily  She reports worsening lower extremity edema over the past several weeks, pitting Increasing shortness of breath Otherwise active at baseline  Denies significant chest pain concerning for angina Reports she has been on amlodipine for several years Compliant with Eliquis 5 twice daily  EKG personally reviewed by myself on todays visit EKG Interpretation Date/Time:  Tuesday February 05 2023 09:07:58 EST Ventricular Rate:  100 PR Interval:    QRS Duration:  80 QT Interval:  344 QTC Calculation: 443 R Axis:   49  Text Interpretation: Atrial fibrillation Low voltage QRS Confirmed by Julien Nordmann ([id number]) on 02/05/2023 9:36:28 AM    PMH:   has a past medical history of Allergy, GERD (gastroesophageal reflux disease), History of colon polyps, Hyperlipidemia, and Hypertension.  PSH:    Past Surgical History:  Procedure Laterality Date   BARTHOLIN GLAND CYST EXCISION     CHOLECYSTECTOMY     NASAL POLYP SURGERY  1964   TONSILECTOMY/ADENOIDECTOMY WITH MYRINGOTOMY  1950    Current Outpatient Medications  Medication Sig  Dispense Refill   amLODipine (NORVASC) 5 MG tablet Take 1 tablet (5 mg total) by mouth daily. 90 tablet 1   apixaban (ELIQUIS) 5 MG TABS tablet Take 1 tablet (5 mg total) by mouth 2 (two) times daily. 60 tablet 2   escitalopram (LEXAPRO) 20 MG tablet Take 1 tablet (20 mg total) by mouth daily. 90 tablet 1   metoprolol succinate (TOPROL-XL) 25 MG 24 hr tablet TAKE 1 TABLET (25 MG TOTAL) BY MOUTH DAILY. 90 tablet 1   mometasone (ELOCON) 0.1 % cream Apply 1 Application topically daily as needed. Appear pea sized ( or less) amount to external ear, and slightly in canal daily as needed for itching 15 g 1   Multiple Vitamins-Minerals (ICAPS AREDS FORMULA PO) Take by mouth.     pravastatin (PRAVACHOL) 10 MG tablet TAKE ONE TABLET EVERY MONDAY, WEDNESDAY AND FRIDAY 39 tablet 1   telmisartan (MICARDIS) 80 MG tablet Take 1 tablet (80 mg total) by mouth daily. 90 tablet 3   No current facility-administered medications for this visit.     Allergies:   Other   Social History:  The patient  reports that she has never smoked. She has been exposed to tobacco smoke. She has never used smokeless tobacco. She reports that she does not drink alcohol and does not use drugs.   Family History:   family history includes Alzheimer's disease in her mother; CVA in her mother; Esophageal cancer in her father; Heart disease in her mother; Hypertension in her mother.    Review of Systems: Review of Systems  Constitutional: Negative.  HENT: Negative.    Respiratory: Negative.    Cardiovascular:  Positive for palpitations.  Gastrointestinal: Negative.   Musculoskeletal: Negative.   Neurological: Negative.   Psychiatric/Behavioral: Negative.    All other systems reviewed and are negative.   PHYSICAL EXAM: VS:  BP 124/64 (BP Location: Right Arm, Patient Position: Sitting, Cuff Size: Normal)   Pulse 100   Ht 5\' 4"  (1.626 m)   Wt 184 lb (83.5 kg)   LMP 02/29/1992   SpO2 98%   BMI 31.58 kg/m  , BMI Body mass  index is 31.58 kg/m. GEN: Well nourished, well developed, in no acute distress HEENT: normal Neck: no JVD, carotid bruits, or masses Cardiac: RRR; no murmurs, rubs, or gallops,no edema  Respiratory:  clear to auscultation bilaterally, normal work of breathing GI: soft, nontender, nondistended, + BS MS: no deformity or atrophy Skin: warm and dry, no rash Neuro:  Strength and sensation are intact Psych: euthymic mood, full affect   Recent Labs: 03/20/2022: Hemoglobin 13.5; Platelets 298.0; TSH 2.85 11/20/2022: BUN 17; Creatinine, Ser 0.82; Potassium 4.3; Sodium 142 12/21/2022: ALT 15    Lipid Panel Lab Results  Component Value Date   CHOL 123 11/20/2022   HDL 55.60 11/20/2022   LDLCALC 58 11/20/2022   TRIG 46.0 11/20/2022      Wt Readings from Last 3 Encounters:  02/05/23 184 lb (83.5 kg)  01/28/23 183 lb (83 kg)  11/23/22 180 lb (81.6 kg)     ASSESSMENT AND PLAN:  Problem List Items Addressed This Visit       Cardiology Problems   Hypertension   Relevant Orders   EKG 12-Lead (Completed)   Atrial fibrillation (HCC) - Primary   Relevant Orders   EKG 12-Lead (Completed)     Other   Diabetes (HCC)   Persistent atrial fibrillation Symptoms end of September into October EKG November 23, 2022 detailing atrial fibrillation Remains in atrial fibrillation today, elevated rate We have recommended she hold amlodipine, increase metoprolol succinate up to 25 twice daily Continue Eliquis 5 twice daily Echocardiogram ordered to rule out structural heart disease, estimate ejection fraction, rule out valvular issues and estimate size of left atrium -If no significant findings, could consider cardioversion Details of cardioversion discussed  Leg edema She reports swelling started around the same time of her atrial fibrillation Recommend she start Lasix 20 mg daily with potassium 10 daily Once leg swelling improved, would decrease Lasix and potassium down to every other  day Moderate salt and fluid intake  Essential hypertension Recommended she hold amlodipine Increase metoprolol succinate up to 25 twice daily Add Lasix as above Monitor blood pressure at home and call us if blood pressure runs high  Diabetes type 2 A1c trending higher 6.8 continue exercise, careful diet management    Signed, Dossie Arbour, M.D., Ph.D. [facility] [place], [place] [phone]

## 2023-02-05 ENCOUNTER — Ambulatory Visit: Payer: Medicare PPO | Attending: Cardiovascular Disease | Admitting: Cardiovascular Disease

## 2023-02-05 ENCOUNTER — Encounter: Payer: Self-pay | Admitting: Cardiovascular Disease

## 2023-02-05 VITALS — BP 124/64 | HR 100 | Ht 64.0 in | Wt 184.0 lb

## 2023-02-05 DIAGNOSIS — I4819 Other persistent atrial fibrillation: Secondary | ICD-10-CM | POA: Diagnosis not present

## 2023-02-05 DIAGNOSIS — E1165 Type 2 diabetes mellitus with hyperglycemia: Secondary | ICD-10-CM | POA: Diagnosis not present

## 2023-02-05 DIAGNOSIS — R600 Localized edema: Secondary | ICD-10-CM | POA: Diagnosis not present

## 2023-02-05 DIAGNOSIS — I10 Essential (primary) hypertension: Secondary | ICD-10-CM | POA: Diagnosis not present

## 2023-02-05 MED ORDER — POTASSIUM CHLORIDE ER 10 MEQ PO TBCR: 10.0000 meq | EXTENDED_RELEASE_TABLET | Freq: Every day | ORAL | 3 refills | Status: DC

## 2023-02-05 MED ORDER — FUROSEMIDE 20 MG PO TABS: 20.0000 mg | ORAL_TABLET | Freq: Every day | ORAL | 3 refills | Status: DC

## 2023-02-05 MED ORDER — METOPROLOL SUCCINATE ER 25 MG PO TB24: 25.0000 mg | ORAL_TABLET | Freq: Two times a day (BID) | ORAL | 2 refills | Status: DC

## 2023-02-05 NOTE — Patient Instructions (Addendum)
Medication Instructions:  Please hold the amlodipine Increase the metoprolol succinate up to 25 mg twice a day  Please start lasix 20 mg daily with potassium 10 meq  Once leg swelling resolves, Then take lasix and potassium every other day  If you need a refill on your cardiac medications before your next appointment, please call your pharmacy.   Lab work: No new labs needed  Testing/Procedures: Your physician has requested that you have an echocardiogram. Echocardiography is a painless test that uses sound waves to create images of your heart. It provides your doctor with information about the size and shape of your heart and how well your heart's chambers and valves are working.   You may receive an ultrasound enhancing agent through an IV if needed to better visualize your heart during the echo. This procedure takes approximately one hour.  There are no restrictions for this procedure.  This will take place at [address] [facility] [address] [facility], [place] [address]  Please note: We ask at that you not bring children with you during ultrasound (echo/ vascular) testing. Due to room size and safety concerns, children are not allowed in the ultrasound rooms during exams. Our front office staff cannot provide observation of children in our lobby area while testing is being conducted. An adult accompanying a patient to their appointment will only be allowed in the ultrasound room at the discretion of the ultrasound technician under special circumstances. We apologize for any inconvenience.   Follow-Up: At [facility], you and your health needs are our priority.  As part of our continuing mission to provide you with exceptional heart care, we have created designated Provider Care Teams.  These Care Teams include your primary Cardiologist (physician) and Advanced Practice Providers (APPs -  Physician Assistants and Nurse Practitioners) who all work together to provide you with the  care you need, when you need it.  You will need a follow up appointment in 1 month  Providers on your designated Care Team:   Nicolasa Ducking, NP Eula Listen, PA-C Cadence Fransico [place], [place]  COVID-19 Vaccine Information can be found at: [email or web address] For questions related to vaccine distribution or appointments, please email [email or web address] or call 5192637355.

## 2023-02-12 ENCOUNTER — Ambulatory Visit: Payer: Medicare PPO | Attending: Cardiovascular Disease

## 2023-02-12 DIAGNOSIS — I4819 Other persistent atrial fibrillation: Secondary | ICD-10-CM

## 2023-02-12 LAB — ECHOCARDIOGRAM COMPLETE
AV Mean grad: 2.7 mm[Hg]
AV Peak grad: 4.8 mm[Hg]
Ao pk vel: 1.1 m/s
S' Lateral: 2.1 cm

## 2023-02-17 ENCOUNTER — Encounter: Payer: Self-pay | Admitting: Cardiovascular Disease

## 2023-02-18 ENCOUNTER — Other Ambulatory Visit: Payer: Self-pay | Admitting: Emergency Medicine

## 2023-02-18 DIAGNOSIS — Z79899 Other long term (current) drug therapy: Secondary | ICD-10-CM

## 2023-02-21 ENCOUNTER — Encounter: Payer: Self-pay | Admitting: Cardiovascular Disease

## 2023-02-21 ENCOUNTER — Ambulatory Visit: Payer: Medicare PPO | Attending: Cardiovascular Disease | Admitting: Cardiovascular Disease

## 2023-02-21 VITALS — BP 130/80 | HR 98 | Ht 64.0 in | Wt 187.4 lb

## 2023-02-21 DIAGNOSIS — I10 Essential (primary) hypertension: Secondary | ICD-10-CM

## 2023-02-21 DIAGNOSIS — E1165 Type 2 diabetes mellitus with hyperglycemia: Secondary | ICD-10-CM | POA: Diagnosis not present

## 2023-02-21 DIAGNOSIS — R600 Localized edema: Secondary | ICD-10-CM

## 2023-02-21 DIAGNOSIS — Z79899 Other long term (current) drug therapy: Secondary | ICD-10-CM | POA: Diagnosis not present

## 2023-02-21 DIAGNOSIS — I4819 Other persistent atrial fibrillation: Secondary | ICD-10-CM

## 2023-02-21 MED ORDER — METOPROLOL SUCCINATE ER 50 MG PO TB24: 50.0000 mg | ORAL_TABLET | Freq: Two times a day (BID) | ORAL | 1 refills | Status: DC

## 2023-02-21 NOTE — H&P (View-Only) (Signed)
 Cardiology Office Note  Date:  02/21/2023   ID:  Lori Guzman, DOB 1940/07/28, MRN [id number]  PCP:  Lori Shad, MD   Chief Complaint  Patient presents with   1 month follow up     Patient c/o shortness of breath with little activity, fatigue with feeling some irregular heart beats at times.     HPI:  [name] is a 83 year old woman with past medical history of Diabetes Hypertension Hyperlipidemia Diabetes Atrial fibrillation Who presents for follow-up of her atrial fibrillation  Seen by myself in clinic February 05, 2023 At that time metoprolol  succinate dosing increased for rate control 25 twice daily  Echocardiogram ejection fraction 60 to 65%,  Mild to moderately dilated left atrium on my review of images elevated right heart pressures, 41 mmHg  She reports having some shortness of breath when moving the garbage can, has worsening lower extremity edema Has not been taking her Lasix  regularly Clearly visible pitting sock line noted on her visit today  Denies significant tachycardia or palpitations, no PND orthopnea Reports compliance with her Eliquis  5 twice daily  Again discussed with her timing of onset of atrial fibrillation, possibly in the setting of bronchitis in September or early October 2024 Seen by primary care in October  EKG November 23, 2022 showing atrial fibrillation rate 100 bpm Started on Eliquis  and metoprolol  succinate 25 mg daily  EKG personally reviewed by myself on todays visit EKG Interpretation Date/Time:  Thursday February 21 2023 14:14:58 EST Ventricular Rate:  98 PR Interval:    QRS Duration:  62 QT Interval:  360 QTC Calculation: 459 R Axis:   60  Text Interpretation: Atrial fibrillation Low voltage QRS Nonspecific ST and T wave abnormality When compared with ECG of 05-Feb-2023 09:07, No significant change was found Confirmed by Lori Guzman [phone]) on 02/21/2023 2:36:23 PM    PMH:   has a past medical history of  Allergy, GERD (gastroesophageal reflux disease), History of colon polyps, Hyperlipidemia, and Hypertension.  PSH:    Past Surgical History:  Procedure Laterality Date   BARTHOLIN GLAND CYST EXCISION     CHOLECYSTECTOMY     NASAL POLYP SURGERY  1964   TONSILECTOMY/ADENOIDECTOMY WITH MYRINGOTOMY  1950    Current Outpatient Medications  Medication Sig Dispense Refill   apixaban  (ELIQUIS ) 5 MG TABS tablet Take 1 tablet (5 mg total) by mouth 2 (two) times daily. 60 tablet 2   escitalopram  (LEXAPRO ) 20 MG tablet Take 1 tablet (20 mg total) by mouth daily. 90 tablet 1   furosemide  (LASIX ) 20 MG tablet Take 1 tablet (20 mg total) by mouth daily. Take 1 tablet ( 20 MG) daily and once swelling has resolved decrease to every other day. 90 tablet 3   metoprolol  succinate (TOPROL -XL) 25 MG 24 hr tablet Take 1 tablet (25 mg total) by mouth 2 (two) times daily. 180 tablet 2   mometasone  (ELOCON ) 0.1 % cream Apply 1 Application topically daily as needed. Appear pea sized ( or less) amount to external ear, and slightly in canal daily as needed for itching 15 g 1   Multiple Vitamins-Minerals (ICAPS AREDS FORMULA PO) Take by mouth.     potassium chloride  (KLOR-CON ) 10 MEQ tablet Take 1 tablet (10 mEq total) by mouth daily. Take 1 tablet (10 mEq) with lasix . 90 tablet 3   pravastatin  (PRAVACHOL ) 10 MG tablet TAKE ONE TABLET EVERY MONDAY, WEDNESDAY AND FRIDAY 39 tablet 1   telmisartan  (MICARDIS ) 80 MG tablet Take 1 tablet (  80 mg total) by mouth daily. 90 tablet 3   No current facility-administered medications for this visit.    Allergies:   Other   Social History:  The patient  reports that she has never smoked. She has been exposed to tobacco smoke. She has never used smokeless tobacco. She reports that she does not drink alcohol and does not use drugs.   Family History:   family history includes Alzheimer's disease in her mother; CVA in her mother; Esophageal cancer in her father; Heart disease in her  mother; Hypertension in her mother.    Review of Systems: Review of Systems  Constitutional: Negative.   HENT: Negative.    Respiratory: Negative.    Cardiovascular:  Positive for palpitations.  Gastrointestinal: Negative.   Musculoskeletal: Negative.   Neurological: Negative.   Psychiatric/Behavioral: Negative.    All other systems reviewed and are negative.  PHYSICAL EXAM: VS:  BP 130/80 (BP Location: Left Arm, Patient Position: Sitting, Cuff Size: Normal)   Pulse 98   Ht 5' 4 (1.626 m)   Wt 187 lb 6 oz (85 kg)   LMP 02/29/1992   SpO2 97%   BMI 32.16 kg/m  , BMI Body mass index is 32.16 kg/m. Constitutional:  oriented to person, place, and time. No distress.  HENT:  Head: Grossly normal Eyes:  no discharge. No scleral icterus.  Neck: No JVD, no carotid bruits  Cardiovascular: Irregularly irregular, no murmurs appreciated Pulmonary/Chest: Clear to auscultation bilaterally, no wheezes or rails Abdominal: Soft.  no distension.  no tenderness.  Musculoskeletal: Normal range of motion Neurological:  normal muscle tone. Coordination normal. No atrophy Skin: Skin warm and dry Psychiatric: normal affect, pleasant   Recent Labs: 03/20/2022: Hemoglobin 13.5; Platelets 298.0; TSH 2.85 11/20/2022: BUN 17; Creatinine, Ser 0.82; Potassium 4.3; Sodium 142 12/21/2022: ALT 15    Lipid Panel Lab Results  Component Value Date   CHOL 123 11/20/2022   HDL 55.60 11/20/2022   LDLCALC 58 11/20/2022   TRIG 46.0 11/20/2022      Wt Readings from Last 3 Encounters:  02/21/23 187 lb 6 oz (85 kg)  02/05/23 184 lb (83.5 kg)  01/28/23 183 lb (83 kg)     ASSESSMENT AND PLAN:  Problem List Items Addressed This Visit       Cardiology Problems   Hypertension   Relevant Orders   EKG 12-Lead (Completed)   Atrial fibrillation (HCC) - Primary   Relevant Orders   EKG 12-Lead (Completed)     Other   Diabetes (HCC)   Other Visit Diagnoses       Leg edema           Persistent  atrial fibrillation Symptoms end of September into October 2024 Atrial fibrillation documented November 23, 2022  Remains in atrial fibrillation, rate continues to be mildly elevated -Compliant with Eliquis  5 twice daily -Recommended she increase metoprolol  succinate up to 50 twice daily -Discussion concerning various treatment options, we have recommended cardioversion, orders placed, preprocedure labs drawn Will arrange with her daughter who will help drive her for procedure  Leg edema In the setting of atrial fibrillation Also with shortness of breath on exertion Recommend she stay on Lasix  20 daily with potassium 10 daily  Essential hypertension Recommend she increase metoprolol  succinate up to 50 twice daily  Diabetes type 2 A1c trending higher 6.8 continue exercise, careful diet management    Signed, Velinda Lunger, M.D., Ph.D. [facility] [place], [place] [phone]

## 2023-02-21 NOTE — Patient Instructions (Addendum)
 Medication Instructions:  Please increase the metoprolol  succinate up to 50 mg twice a day  If you need a refill on your cardiac medications before your next appointment, please call your pharmacy.   Lab work: Your provider would like for you to have following labs drawn today CBC and BMP.    Testing/Procedures: You are scheduled for a Cardioversion on 02/26/23 with Dr. Gollan.    Please arrive at the [facility] Entrance of [facility], [address], [place] [address] at 7:00 am (This is 1 hour prior to your procedure time).  Proceed to the Check-In Desk directly inside the entrance.  Procedure Parking: Use the entrance off of the [address] side of the hospital. Turn right upon entering and follow the driveway to parking that is directly in front of the [facility] Center. There is no valet parking available at this entrance, however there is an awning directly in front of the [facility]ascular Center for drop off/ pick up for patients  DIET: Nothing to eat or drink after midnight except a sip of water with medications (see medication instructions below)  Medication Instructions: Hold Lasix  the morning of your procedure. Continue your anticoagulant: Eliquis  If you miss a dose, please call us  at [phone] You will need to continue your anticoagulant after your procedure until you are told by your provider that it is safe to stop.   FYI: For your safety, and to allow us  to monitor your vital signs accurately during the surgery/procedure we request that if you have artificial nails, gel coating, SNS etc. Please have those removed prior to your surgery/procedure. Not having the nail coverings /polish removed may result in cancellation or delay of your surgery/procedure.  You must have a responsible person to drive you home and stay in the waiting area during your procedure. Failure to do so could result in cancellation.  Bring your insurance cards.  If you have any  questions after you get home, please call the office at [phone]  *Special Note: Every effort is made to have your procedure done on time. Occasionally there are emergencies that occur at the hospital that may cause delays. Please be patient if a delay does occur.       Follow-Up: At [facility], you and your health needs are our priority.  As part of our continuing mission to provide you with exceptional heart care, we have created designated Provider Care Teams.  These Care Teams include your primary Cardiologist (physician) and Advanced Practice Providers (APPs -  Physician Assistants and Nurse Practitioners) who all work together to provide you with the care you need, when you need it.  You will need a follow up appointment in 1 month  Providers on your designated Care Team:   Lonni Meager, NP Bernardino Bring, PA-C Cadence [place], [place]  COVID-19 Vaccine Information can be found at: [email or web address] For questions related to vaccine distribution or appointments, please email [email or web address] or call [phone].

## 2023-02-21 NOTE — Progress Notes (Signed)
 Cardiology Office Note  Date:  02/21/2023   ID:  Lori Guzman, DOB 1940/07/28, MRN [id number]  PCP:  Lori Shad, MD   Chief Complaint  Patient presents with   1 month follow up     Patient c/o shortness of breath with little activity, fatigue with feeling some irregular heart beats at times.     HPI:  [name] is a 83 year old woman with past medical history of Diabetes Hypertension Hyperlipidemia Diabetes Atrial fibrillation Who presents for follow-up of her atrial fibrillation  Seen by myself in clinic February 05, 2023 At that time metoprolol  succinate dosing increased for rate control 25 twice daily  Echocardiogram ejection fraction 60 to 65%,  Mild to moderately dilated left atrium on my review of images elevated right heart pressures, 41 mmHg  She reports having some shortness of breath when moving the garbage can, has worsening lower extremity edema Has not been taking her Lasix  regularly Clearly visible pitting sock line noted on her visit today  Denies significant tachycardia or palpitations, no PND orthopnea Reports compliance with her Eliquis  5 twice daily  Again discussed with her timing of onset of atrial fibrillation, possibly in the setting of bronchitis in September or early October 2024 Seen by primary care in October  EKG November 23, 2022 showing atrial fibrillation rate 100 bpm Started on Eliquis  and metoprolol  succinate 25 mg daily  EKG personally reviewed by myself on todays visit EKG Interpretation Date/Time:  Thursday February 21 2023 14:14:58 EST Ventricular Rate:  98 PR Interval:    QRS Duration:  62 QT Interval:  360 QTC Calculation: 459 R Axis:   60  Text Interpretation: Atrial fibrillation Low voltage QRS Nonspecific ST and T wave abnormality When compared with ECG of 05-Feb-2023 09:07, No significant change was found Confirmed by Lori Guzman [phone]) on 02/21/2023 2:36:23 PM    PMH:   has a past medical history of  Allergy, GERD (gastroesophageal reflux disease), History of colon polyps, Hyperlipidemia, and Hypertension.  PSH:    Past Surgical History:  Procedure Laterality Date   BARTHOLIN GLAND CYST EXCISION     CHOLECYSTECTOMY     NASAL POLYP SURGERY  1964   TONSILECTOMY/ADENOIDECTOMY WITH MYRINGOTOMY  1950    Current Outpatient Medications  Medication Sig Dispense Refill   apixaban  (ELIQUIS ) 5 MG TABS tablet Take 1 tablet (5 mg total) by mouth 2 (two) times daily. 60 tablet 2   escitalopram  (LEXAPRO ) 20 MG tablet Take 1 tablet (20 mg total) by mouth daily. 90 tablet 1   furosemide  (LASIX ) 20 MG tablet Take 1 tablet (20 mg total) by mouth daily. Take 1 tablet ( 20 MG) daily and once swelling has resolved decrease to every other day. 90 tablet 3   metoprolol  succinate (TOPROL -XL) 25 MG 24 hr tablet Take 1 tablet (25 mg total) by mouth 2 (two) times daily. 180 tablet 2   mometasone  (ELOCON ) 0.1 % cream Apply 1 Application topically daily as needed. Appear pea sized ( or less) amount to external ear, and slightly in canal daily as needed for itching 15 g 1   Multiple Vitamins-Minerals (ICAPS AREDS FORMULA PO) Take by mouth.     potassium chloride  (KLOR-CON ) 10 MEQ tablet Take 1 tablet (10 mEq total) by mouth daily. Take 1 tablet (10 mEq) with lasix . 90 tablet 3   pravastatin  (PRAVACHOL ) 10 MG tablet TAKE ONE TABLET EVERY MONDAY, WEDNESDAY AND FRIDAY 39 tablet 1   telmisartan  (MICARDIS ) 80 MG tablet Take 1 tablet (  80 mg total) by mouth daily. 90 tablet 3   No current facility-administered medications for this visit.    Allergies:   Other   Social History:  The patient  reports that she has never smoked. She has been exposed to tobacco smoke. She has never used smokeless tobacco. She reports that she does not drink alcohol and does not use drugs.   Family History:   family history includes Alzheimer's disease in her mother; CVA in her mother; Esophageal cancer in her father; Heart disease in her  mother; Hypertension in her mother.    Review of Systems: Review of Systems  Constitutional: Negative.   HENT: Negative.    Respiratory: Negative.    Cardiovascular:  Positive for palpitations.  Gastrointestinal: Negative.   Musculoskeletal: Negative.   Neurological: Negative.   Psychiatric/Behavioral: Negative.    All other systems reviewed and are negative.  PHYSICAL EXAM: VS:  BP 130/80 (BP Location: Left Arm, Patient Position: Sitting, Cuff Size: Normal)   Pulse 98   Ht 5' 4 (1.626 m)   Wt 187 lb 6 oz (85 kg)   LMP 02/29/1992   SpO2 97%   BMI 32.16 kg/m  , BMI Body mass index is 32.16 kg/m. Constitutional:  oriented to person, place, and time. No distress.  HENT:  Head: Grossly normal Eyes:  no discharge. No scleral icterus.  Neck: No JVD, no carotid bruits  Cardiovascular: Irregularly irregular, no murmurs appreciated Pulmonary/Chest: Clear to auscultation bilaterally, no wheezes or rails Abdominal: Soft.  no distension.  no tenderness.  Musculoskeletal: Normal range of motion Neurological:  normal muscle tone. Coordination normal. No atrophy Skin: Skin warm and dry Psychiatric: normal affect, pleasant   Recent Labs: 03/20/2022: Hemoglobin 13.5; Platelets 298.0; TSH 2.85 11/20/2022: BUN 17; Creatinine, Ser 0.82; Potassium 4.3; Sodium 142 12/21/2022: ALT 15    Lipid Panel Lab Results  Component Value Date   CHOL 123 11/20/2022   HDL 55.60 11/20/2022   LDLCALC 58 11/20/2022   TRIG 46.0 11/20/2022      Wt Readings from Last 3 Encounters:  02/21/23 187 lb 6 oz (85 kg)  02/05/23 184 lb (83.5 kg)  01/28/23 183 lb (83 kg)     ASSESSMENT AND PLAN:  Problem List Items Addressed This Visit       Cardiology Problems   Hypertension   Relevant Orders   EKG 12-Lead (Completed)   Atrial fibrillation (HCC) - Primary   Relevant Orders   EKG 12-Lead (Completed)     Other   Diabetes (HCC)   Other Visit Diagnoses       Leg edema           Persistent  atrial fibrillation Symptoms end of September into October 2024 Atrial fibrillation documented November 23, 2022  Remains in atrial fibrillation, rate continues to be mildly elevated -Compliant with Eliquis  5 twice daily -Recommended she increase metoprolol  succinate up to 50 twice daily -Discussion concerning various treatment options, we have recommended cardioversion, orders placed, preprocedure labs drawn Will arrange with her daughter who will help drive her for procedure  Leg edema In the setting of atrial fibrillation Also with shortness of breath on exertion Recommend she stay on Lasix  20 daily with potassium 10 daily  Essential hypertension Recommend she increase metoprolol  succinate up to 50 twice daily  Diabetes type 2 A1c trending higher 6.8 continue exercise, careful diet management    Signed, Velinda Lunger, M.D., Ph.D. [facility] [place], [place] [phone]

## 2023-02-22 LAB — BASIC METABOLIC PANEL
BUN/Creatinine Ratio: 25 (ref 12–28)
BUN: 20 mg/dL (ref 8–27)
CO2: 26 mmol/L (ref 20–29)
Calcium: 9.2 mg/dL (ref 8.7–10.3)
Chloride: 105 mmol/L (ref 96–106)
Creatinine, Ser: 0.81 mg/dL (ref 0.57–1.00)
Glucose: 131 mg/dL — ABNORMAL HIGH (ref 70–99)
Potassium: 4.3 mmol/L (ref 3.5–5.2)
Sodium: 145 mmol/L — ABNORMAL HIGH (ref 134–144)
eGFR: 73 mL/min/{1.73_m2} (ref >59)

## 2023-02-22 LAB — CBC
Hematocrit: 36.7 % (ref 34.0–46.6)
Hemoglobin: 11.8 g/dL (ref 11.1–15.9)
MCH: 30 pg (ref 26.6–33.0)
MCHC: 32.2 g/dL (ref 31.5–35.7)
MCV: 93 fL (ref 79–97)
Platelets: 260 10*3/uL (ref 150–450)
RBC: 3.93 x10E6/uL (ref 3.77–5.28)
RDW: 12 % (ref 11.7–15.4)
WBC: 7.3 10*3/uL (ref 3.4–10.8)

## 2023-02-23 ENCOUNTER — Other Ambulatory Visit: Payer: Self-pay | Admitting: Cardiovascular Disease

## 2023-02-23 DIAGNOSIS — I4819 Other persistent atrial fibrillation: Secondary | ICD-10-CM

## 2023-02-25 MED ORDER — SODIUM CHLORIDE 0.9% FLUSH
3.0000 mL | INTRAVENOUS | Status: DC | PRN
  Administered 2023-02-26: 10 mL via INTRAVENOUS

## 2023-02-25 MED ORDER — SODIUM CHLORIDE 0.9% FLUSH: 3.0000 mL | Freq: Two times a day (BID) | INTRAVENOUS | Status: DC

## 2023-02-25 NOTE — Telephone Encounter (Signed)
 Patient was seen in clinic 02/21/23 by Dr. Mariah Milling all questions addressed at that time.

## 2023-02-26 ENCOUNTER — Encounter: Payer: Self-pay | Admitting: Cardiovascular Disease

## 2023-02-26 ENCOUNTER — Encounter: Admission: RE | Payer: Self-pay | Source: Home / Self Care

## 2023-02-26 ENCOUNTER — Ambulatory Visit: Payer: Self-pay | Admitting: Anesthesiology

## 2023-02-26 ENCOUNTER — Encounter
Admission: RE | Disposition: A | Discharge status: Home / Self Care | Payer: Self-pay | Source: Home / Self Care | Attending: Cardiovascular Disease

## 2023-02-26 ENCOUNTER — Other Ambulatory Visit: Payer: Self-pay

## 2023-02-26 ENCOUNTER — Ambulatory Visit
Admission: RE | Admit: 2023-02-26 | Discharge: 2023-02-26 | Disposition: A | Discharge status: Home / Self Care | Payer: Medicare PPO | Attending: Cardiovascular Disease | Admitting: Cardiovascular Disease

## 2023-02-26 DIAGNOSIS — Z7901 Long term (current) use of anticoagulants: Secondary | ICD-10-CM | POA: Insufficient documentation

## 2023-02-26 DIAGNOSIS — I10 Essential (primary) hypertension: Secondary | ICD-10-CM | POA: Diagnosis not present

## 2023-02-26 DIAGNOSIS — E119 Type 2 diabetes mellitus without complications: Secondary | ICD-10-CM | POA: Insufficient documentation

## 2023-02-26 DIAGNOSIS — R0602 Shortness of breath: Secondary | ICD-10-CM

## 2023-02-26 DIAGNOSIS — I4819 Other persistent atrial fibrillation: Secondary | ICD-10-CM | POA: Insufficient documentation

## 2023-02-26 DIAGNOSIS — E785 Hyperlipidemia, unspecified: Secondary | ICD-10-CM | POA: Insufficient documentation

## 2023-02-26 DIAGNOSIS — Z79899 Other long term (current) drug therapy: Secondary | ICD-10-CM | POA: Diagnosis not present

## 2023-02-26 DIAGNOSIS — I4891 Unspecified atrial fibrillation: Secondary | ICD-10-CM | POA: Diagnosis not present

## 2023-02-26 HISTORY — PX: CARDIOVERSION: SHX1299

## 2023-02-26 SURGERY — CARDIOVERSION
Anesthesia: General

## 2023-02-26 MED ORDER — PROPOFOL 10 MG/ML IV BOLUS
INTRAVENOUS | Status: AC
  Filled 2023-02-26: qty 20

## 2023-02-26 MED ORDER — LIDOCAINE HCL (PF) 2 % IJ SOLN
INTRAMUSCULAR | Status: AC
  Filled 2023-02-26: qty 5

## 2023-02-26 MED ORDER — LIDOCAINE HCL (CARDIAC) PF 100 MG/5ML IV SOSY
PREFILLED_SYRINGE | INTRAVENOUS | Status: DC | PRN
  Administered 2023-02-26: 30 mg via INTRAVENOUS

## 2023-02-26 MED ORDER — PROPOFOL 500 MG/50ML IV EMUL
INTRAVENOUS | Status: DC | PRN
  Administered 2023-02-26: 40 mg via INTRAVENOUS
  Administered 2023-02-26: 20 mg via INTRAVENOUS

## 2023-02-26 NOTE — CV Procedure (Signed)
 Cardioversion procedure note For atrial fibrillation, persistent.  Procedure Details:  Consent: Risks of procedure as well as the alternatives and risks of each were explained to the (patient/caregiver).  Consent for procedure obtained.  Time Out: Verified patient identification, verified procedure, site/side was marked, verified correct patient position, special equipment/implants available, medications/allergies/relevent history reviewed, required imaging and test results available.  Performed  Patient placed on cardiac monitor, pulse oximetry, supplemental oxygen as necessary.   Sedation given: propofol  IV, Dr. Chesley Pacer pads placed anterior and posterior chest.   Cardioverted 2 time(s).   Cardioverted at  150 J, 200J. Synchronized biphasic Converted to NSR   Evaluation: Findings: Post procedure EKG shows: NSR Complications: None Patient did tolerate procedure well.  Time Spent Directly with the Patient:  45 minutes   Tim Essica Kiker, M.D., Ph.D.

## 2023-02-26 NOTE — Interval H&P Note (Signed)
 History and Physical Interval Note:  02/26/2023 9:22 AM  Lori Guzman  has presented today for surgery, with the diagnosis of Cardioversion  Afib OK 2nd case [name] Anesthesia   Start time 8a.  The various methods of treatment have been discussed with the patient and family. After consideration of risks, benefits and other options for treatment, the patient has consented to  Procedure(s): CARDIOVERSION (N/A) as a surgical intervention.  The patient's history has been reviewed, patient examined, no change in status, stable for surgery.  I have reviewed the patient's chart and labs.  Questions were answered to the patient's satisfaction.     Daine Croker

## 2023-02-26 NOTE — Transfer of Care (Signed)
 Immediate Anesthesia Transfer of Care Note  Patient: Lori Guzman  Procedure(s) Performed: CARDIOVERSION  Patient Location: PACU  Anesthesia Type:MAC  Level of Consciousness: awake, alert , and oriented  Airway & Oxygen Therapy: Patient Spontanous Breathing and Patient connected to nasal cannula oxygen  Post-op Assessment: Report given to RN and Post -op Vital signs reviewed and stable  Post vital signs: stable  Last Vitals:  Vitals Value Taken Time  BP    Temp    Pulse    Resp    SpO2      Last Pain:  Vitals:   02/26/23 0726  TempSrc: Oral  PainSc: 0-No pain         Complications: No notable events documented.

## 2023-02-26 NOTE — Anesthesia Postprocedure Evaluation (Signed)
 Anesthesia Post Note  Patient: Lori Guzman  Procedure(s) Performed: CARDIOVERSION  Patient location during evaluation: Specials Recovery Anesthesia Type: General Level of consciousness: awake and alert Pain management: pain level controlled Vital Signs Assessment: post-procedure vital signs reviewed and stable Respiratory status: spontaneous breathing, nonlabored ventilation, respiratory function stable and patient connected to nasal cannula oxygen Cardiovascular status: blood pressure returned to baseline and stable Postop Assessment: no apparent nausea or vomiting Anesthetic complications: no   No notable events documented.   Last Vitals:  Vitals:   02/26/23 0815 02/26/23 0830  BP: 139/65 132/67  Pulse: (!) 50 (!) 49  Resp: 18 14  Temp:    SpO2: 94% 95%    Last Pain:  Vitals:   02/26/23 0830  TempSrc:   PainSc: 0-No pain                 Lendia LITTIE Mae

## 2023-02-26 NOTE — Anesthesia Preprocedure Evaluation (Signed)
 Anesthesia Evaluation  Patient identified by MRN, date of birth, ID band Patient awake    Reviewed: Allergy & Precautions, NPO status , Patient's Chart, lab work & pertinent test results  History of Anesthesia Complications Negative for: history of anesthetic complications  Airway Mallampati: II  TM Distance: >3 FB Neck ROM: full    Dental no notable dental hx.    Pulmonary neg pulmonary ROS   Pulmonary exam normal        Cardiovascular hypertension, On Medications + dysrhythmias Atrial Fibrillation  Rhythm:Irregular Rate:Normal     Neuro/Psych negative neurological ROS  negative psych ROS   GI/Hepatic Neg liver ROS,GERD  Medicated,,  Endo/Other  diabetes    Renal/GU negative Renal ROS  negative genitourinary   Musculoskeletal   Abdominal   Peds  Hematology negative hematology ROS (+)   Anesthesia Other Findings Past Medical History: No date: Allergy     Comment:  reoccuring problems No date: GERD (gastroesophageal reflux disease) No date: History of colon polyps     Comment:  adenomatous No date: Hyperlipidemia No date: Hypertension  Past Surgical History: No date: BARTHOLIN GLAND CYST EXCISION No date: CHOLECYSTECTOMY 1964: NASAL POLYP SURGERY 1950: TONSILECTOMY/ADENOIDECTOMY WITH MYRINGOTOMY     Reproductive/Obstetrics negative OB ROS                             Anesthesia Physical Anesthesia Plan  ASA: 3  Anesthesia Plan: General   Post-op Pain Management: Minimal or no pain anticipated   Induction: Intravenous  PONV Risk Score and Plan: 2 and Propofol  infusion and TIVA  Airway Management Planned: Natural Airway and Nasal Cannula  Additional Equipment:   Intra-op Plan:   Post-operative Plan:   Informed Consent: I have reviewed the patients History and Physical, chart, labs and discussed the procedure including the risks, benefits and alternatives for the  proposed anesthesia with the patient or authorized representative who has indicated his/her understanding and acceptance.     Dental Advisory Given  Plan Discussed with: Anesthesiologist, CRNA and Surgeon  Anesthesia Plan Comments: (Patient consented for risks of anesthesia including but not limited to:  - adverse reactions to medications - risk of airway placement if required - damage to eyes, teeth, lips or other oral mucosa - nerve damage due to positioning  - sore throat or hoarseness - Damage to heart, brain, nerves, lungs, other parts of body or loss of life  Patient voiced understanding and assent.)        Anesthesia Quick Evaluation

## 2023-02-26 NOTE — H&P (Signed)

## 2023-02-27 ENCOUNTER — Encounter: Payer: Self-pay | Admitting: Cardiovascular Disease

## 2023-02-28 ENCOUNTER — Other Ambulatory Visit: Payer: Self-pay

## 2023-02-28 ENCOUNTER — Encounter: Payer: Self-pay | Admitting: Emergency Medicine

## 2023-02-28 ENCOUNTER — Emergency Department: Payer: Medicare PPO

## 2023-02-28 DIAGNOSIS — S0081XA Abrasion of other part of head, initial encounter: Secondary | ICD-10-CM | POA: Diagnosis not present

## 2023-02-28 DIAGNOSIS — I4891 Unspecified atrial fibrillation: Secondary | ICD-10-CM | POA: Insufficient documentation

## 2023-02-28 DIAGNOSIS — Z5321 Procedure and treatment not carried out due to patient leaving prior to being seen by health care provider: Secondary | ICD-10-CM | POA: Insufficient documentation

## 2023-02-28 DIAGNOSIS — R5383 Other fatigue: Secondary | ICD-10-CM | POA: Diagnosis not present

## 2023-02-28 DIAGNOSIS — M25561 Pain in right knee: Secondary | ICD-10-CM | POA: Diagnosis not present

## 2023-02-28 DIAGNOSIS — M542 Cervicalgia: Secondary | ICD-10-CM | POA: Diagnosis not present

## 2023-02-28 DIAGNOSIS — S0993XA Unspecified injury of face, initial encounter: Secondary | ICD-10-CM | POA: Diagnosis present

## 2023-02-28 DIAGNOSIS — R220 Localized swelling, mass and lump, head: Secondary | ICD-10-CM | POA: Diagnosis not present

## 2023-02-28 DIAGNOSIS — R001 Bradycardia, unspecified: Secondary | ICD-10-CM | POA: Diagnosis not present

## 2023-02-28 DIAGNOSIS — W1839XA Other fall on same level, initial encounter: Secondary | ICD-10-CM | POA: Insufficient documentation

## 2023-02-28 DIAGNOSIS — R0789 Other chest pain: Secondary | ICD-10-CM | POA: Diagnosis not present

## 2023-02-28 DIAGNOSIS — R42 Dizziness and giddiness: Secondary | ICD-10-CM | POA: Diagnosis not present

## 2023-02-28 DIAGNOSIS — I672 Cerebral atherosclerosis: Secondary | ICD-10-CM | POA: Insufficient documentation

## 2023-02-28 DIAGNOSIS — I6782 Cerebral ischemia: Secondary | ICD-10-CM | POA: Insufficient documentation

## 2023-02-28 LAB — CBC WITH DIFFERENTIAL/PLATELET
Abs Immature Granulocytes: 0.03 10*3/uL (ref 0.00–0.07)
Basophils Absolute: 0 10*3/uL (ref 0.0–0.1)
Basophils Relative: 1 %
Eosinophils Absolute: 0.1 10*3/uL (ref 0.0–0.5)
Eosinophils Relative: 2 %
HCT: 35.5 % — ABNORMAL LOW (ref 36.0–46.0)
Hemoglobin: 11.4 g/dL — ABNORMAL LOW (ref 12.0–15.0)
Immature Granulocytes: 0 %
Lymphocytes Relative: 22 %
Lymphs Abs: 1.5 10*3/uL (ref 0.7–4.0)
MCH: 30.4 pg (ref 26.0–34.0)
MCHC: 32.1 g/dL (ref 30.0–36.0)
MCV: 94.7 fL (ref 80.0–100.0)
Monocytes Absolute: 0.6 10*3/uL (ref 0.1–1.0)
Monocytes Relative: 9 %
Neutro Abs: 4.7 10*3/uL (ref 1.7–7.7)
Neutrophils Relative %: 66 %
Platelets: 200 10*3/uL (ref 150–400)
RBC: 3.75 MIL/uL — ABNORMAL LOW (ref 3.87–5.11)
RDW: 13.4 % (ref 11.5–15.5)
WBC: 6.9 10*3/uL (ref 4.0–10.5)
nRBC: 0 % (ref 0.0–0.2)

## 2023-02-28 LAB — BASIC METABOLIC PANEL
Anion gap: 11 (ref 5–15)
BUN: 23 mg/dL (ref 8–23)
CO2: 28 mmol/L (ref 22–32)
Calcium: 9.4 mg/dL (ref 8.9–10.3)
Chloride: 101 mmol/L (ref 98–111)
Creatinine, Ser: 0.86 mg/dL (ref 0.44–1.00)
GFR, Estimated: >60 mL/min (ref >60)
Glucose, Bld: 132 mg/dL — ABNORMAL HIGH (ref 70–99)
Potassium: 4.1 mmol/L (ref 3.5–5.1)
Sodium: 140 mmol/L (ref 135–145)

## 2023-02-28 LAB — TROPONIN I (HIGH SENSITIVITY): Troponin I (High Sensitivity): 5 ng/L (ref <18)

## 2023-02-28 MED ORDER — IOHEXOL 350 MG/ML SOLN: 100.0000 mL | Freq: Once | INTRAVENOUS | Status: DC | PRN

## 2023-02-28 NOTE — ED Triage Notes (Addendum)
Patient here having a fall today at 5:30 pm. Patient is on a blood thinner- eliquis. Patient states she was walking to her mail box, felt dizzy, stumbled a few times, and then eventually fell. Landed face first onto gravel. Did not have a syncopal episode. Hit front of right knee, forehead. C/o head and neck pain, right knee pain. Abrasion to the forehead and R knee.Did have a cardioversion the past Tuesday and dc'ed after states she has not felt right since.

## 2023-02-28 NOTE — ED Provider Triage Note (Signed)
Emergency Medicine Provider Triage Evaluation Note  Lori Guzman , a 83 y.o. female  was evaluated in triage.  Pt complains of fall. Was walking to the mailbox where she felt dizzy which caused her to fall. She was able to get up and walk back into the house. No LOC. Patient does take blood thinners. Had a cardioversion for afib on 1/14. Since the cardioversion she reports she has felt "off". She describes it has chest pressure, feeling fatigued, she gets tired with any activity.   Review of Systems  Positive: Abrasions to the face, neck and knee, knee pain Negative: Vision changes, headache, N/V  Physical Exam  LMP 02/29/1992  Gen:   Awake, no distress   Resp:  Normal effort  MSK:   Moves extremities without difficulty  Other:    Medical Decision Making  Medically screening exam initiated at 7:12 PM.  Appropriate orders placed.  Lori Guzman was informed that the remainder of the evaluation will be completed by another provider, this initial triage assessment does not replace that evaluation, and the importance of remaining in the ED until their evaluation is complete.     Cameron Ali, PA-C 02/28/23 [phone]

## 2023-03-01 ENCOUNTER — Encounter: Payer: Self-pay | Admitting: Internal Medicine

## 2023-03-01 ENCOUNTER — Encounter: Payer: Self-pay | Admitting: Cardiovascular Disease

## 2023-03-01 ENCOUNTER — Emergency Department
Admission: EM | Admit: 2023-03-01 | Discharge: 2023-03-01 | Discharge status: Against Medical Advice | Payer: Medicare PPO | Attending: Student | Admitting: Student

## 2023-03-01 ENCOUNTER — Telehealth: Payer: Self-pay

## 2023-03-01 ENCOUNTER — Telehealth: Payer: Self-pay | Admitting: Cardiovascular Disease

## 2023-03-01 LAB — TROPONIN I (HIGH SENSITIVITY): Troponin I (High Sensitivity): 5 ng/L (ref <18)

## 2023-03-01 NOTE — Telephone Encounter (Signed)
Called and spoke with patient. Patient states that she had a cardioversion on 02/26/23. Patient states that after her cardioversion she was not feeling well. She said she had a poor appetite, tired and weak. Patient reports that yesterday she was walking to the mailbox became dizzy and fell. Patient states that she went to the emergency department. She said that she had a CT scan but waited 7 hours and was not seen by provider. Patient reports that symptoms are better today. Patient states that her blood pressure has been 120's/60's and heart rate 50-70's. Patient offered appointment in clinic but states that she does not want to come in to be seen. She would just like recommendations from Dr. Mariah Milling. Will forward for further recommendations.

## 2023-03-01 NOTE — Telephone Encounter (Signed)
Copied from CRM [id number]. Topic: General - Other >> Mar 01, 2023 11:51 AM Adele Barthel wrote: Reason for CRM: Patient called because she fell yesterday while walking to the mailbox. She was walking back up the driveway and began to feel dizziness and short of breath. She fell and skinned her nose, face, and knee. Neck felt stiff but this has improved. Went to ER last night, had a CT scan performed. Wanted to let provider to know. The daughter has also reached out to the cardiologist, due to having a procedure performed on Tues

## 2023-03-01 NOTE — Telephone Encounter (Signed)
Called patient and left a detailed message with the following from Dr. Mariah Milling.  Would recommend she rest over the weekend If she can check heart rate and blood pressure several times through the weekend and let us know what they are on Monday It is possible we need to adjust the medications Thx TGollan   Asked patient to call back with any questions.

## 2023-03-01 NOTE — Telephone Encounter (Signed)
See phone note

## 2023-03-01 NOTE — Telephone Encounter (Signed)
FYI   See note from cardiology. She is doing ok. Resting today per Dr Ethelene Hal recommendation. She is going to continue to rest and monitor her vital signs and f/u with cardiology. Advised patient to let us know if she needs anything but also to continue to follow closely with cardiology until stable. Pt agreed.

## 2023-03-01 NOTE — Telephone Encounter (Signed)
Please call and discuss visit and confirm how she is doing now.  Any acute change or worsening symptoms - she needs to be evaluated. Can also schedule a f/u with me

## 2023-03-01 NOTE — Telephone Encounter (Signed)
Patient had a cardioverison on 02/26/23. Since having the procedure, patient stated she has not felt good. On Wednesday she felt very bloated and her back was sore. Patient stated she feel on Thursday because she felt dizzy (see patient message 03/01/23). Patient would like to know if she should be concerned about her symptoms. Please advise.

## 2023-03-02 NOTE — Telephone Encounter (Signed)
Reviewed note and reviewed cardiology response. She is planning to monitor blood pressure, pulse and symptoms over the weekend. Please f/u and confirm doing ok.

## 2023-03-04 ENCOUNTER — Other Ambulatory Visit: Payer: Self-pay | Admitting: Internal Medicine

## 2023-03-04 NOTE — Telephone Encounter (Signed)
FYI- called pt for update. States she is doing better. Feeling better. She is going to call cardiology this AM to give readings so they can determine if any medication changes need to be made.

## 2023-03-05 ENCOUNTER — Ambulatory Visit: Payer: Medicare PPO | Admitting: Cardiovascular Disease

## 2023-03-06 NOTE — Progress Notes (Signed)
Cardiology Clinic Note   Date: 03/08/2023 ID: DEE PADEN, DOB 1940-11-03, MRN [id number]  Primary Cardiologist:  None  Patient Profile    Lori Guzman is a 83 y.o. female who presents to the clinic today for follow up after fall.     Past medical history significant for: PAF. Echo 02/12/2023: EF 60 to 65%.  No RWMA.  Indeterminate diastolic parameters.  Normal RV size/function.  Moderately elevated PA pressure, RVSP 41 mmHg.  Severe LAE.  Mild to moderate MR.  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 DCCV 02/26/2023. Hypertension. Hyperlipidemia. Lipid panel 11/20/2022: LDL 58, HDL 56, TG 46, total 123. Near syncope. GERD. T2DM.  In summary, patient was first evaluated by Dr. Mariah Milling on 02/05/2023 for A-fib at the request of Dr. Lorin Picket.  Patient reported at the end of September early October she noted tachypalpitations while lying down at night.  She was seen by PCP in October and found to be in A-fib rate 100 bpm.  She was started on Eliquis and metoprolol and referred to cardiology.  At the time of her visit she noted worsening lower extremity edema and increasing dyspnea over several weeks.  EKG showed A-fib, 100 bpm.  She was scheduled for echo and DCCV was planned.  She was also started on Lasix for lower extremity edema.  Echo showed normal LV/RV function as detailed above.  Patient underwent cardioversion on 02/26/2023.     History of Present Illness    PEARSON REASONS is followed by Dr. Mariah Milling for the above outlined history.  Patient contacted the office on 03/01/2023 with complaints of dizziness.  Per triage RN: "Called and spoke with patient. Patient states that she had a cardioversion on 02/26/23. Patient states that after her cardioversion she was not feeling well. She said she had a poor appetite, tired and weak. Patient reports that yesterday she was walking to the mailbox became dizzy and fell. Patient states that she went to the emergency department. She said that she  had a CT scan but waited 7 hours and was not seen by provider. Patient reports that symptoms are better today. Patient states that her blood pressure has been 120's/60's and heart rate 50-70's. Patient offered appointment in clinic but states that she does not want to come in to be seen. She would just like recommendations from Dr. Mariah Milling. Will forward for further recommendations."  Dr. Mariah Milling recommended rest over the weekend with frequent heart rate and blood pressure checks.  Patient was evaluated in the ER on 02/28/2023 after a fall at home.  She reported feeling dizzy while walking to her mailbox and fell forward landing in the gravel.  She sustained abrasions to forehead and right knee and complained of some head and neck pain.  She did not lose consciousness.  CT head maxillofacial and cervical spine without acute findings other than mild soft tissue swelling of the left frontal bone.  She was maintaining sinus rhythm at the time of her ED visit.  Today, patient is here alone. She denies further falls. She reports walking to her mailbox after not doing much for the two days post cardioversion. She made it to the mailbox okay and began to feel a little dizzy. She started to rush back to her house and felt she was breathing heavily and then fell forward. She denies chest pain, shortness of breath or palpitations prior to the fall. She was adequately hydrated but had not been eating much the 2 days prior. Home  BP has been on the low side 100-120/60. It is elevated today. She did have a drop in SBP of 23 mmHg with orthostatics but was asymptomatic. She denies palpitations or feeling of arrhythmia since DCCV.      ROS: All other systems reviewed and are otherwise negative except as noted in History of Present Illness.  EKGs/Labs Reviewed    EKG Interpretation Date/Time:  Friday March 08 2023 11:27:03 EST Ventricular Rate:  54 PR Interval:  186 QRS Duration:  66 QT Interval:  486 QTC  Calculation: 460 R Axis:   35  Text Interpretation: Sinus bradycardia Low voltage QRS When compared with ECG of 28-Feb-2023 19:18, Premature ventricular complexes are no longer Present PR interval has decreased Nonspecific T wave abnormality now evident in Inferior leads Confirmed by Carlos Levering [id number]) on 03/08/2023 11:30:37 AM   12/21/2022: ALT 15; AST 14 02/28/2023: BUN 23; Creatinine, Ser 0.86; Potassium 4.1; Sodium 140   02/28/2023: Hemoglobin 11.4; WBC 6.9   03/20/2022: TSH 2.85    Risk Assessment/Calculations     CHA2DS2-VASc Score = 5   This indicates a 7.2% annual risk of stroke. The patient's score is based upon: CHF History: 0 HTN History: 1 Diabetes History: 1 Stroke History: 0 Vascular Disease History: 0 Age Score: 2 Gender Score: 1         Physical Exam    VS:  BP (!) 150/70 (BP Location: Right Arm)   Pulse (!) 54   Ht 5\' 4"  (1.626 m)   Wt 187 lb (84.8 kg)   LMP 02/29/1992   SpO2 96%   BMI 32.10 kg/m  , BMI Body mass index is 32.1 kg/m.  Orthostatic VS for the past 24 hrs (Last 3 readings):  BP- Lying Pulse- Lying BP- Sitting Pulse- Sitting BP- Standing at 0 minutes Pulse- Standing at 0 minutes BP- Standing at 3 minutes Pulse- Standing at 3 minutes  03/08/23 1111 179/77 51 176/76 51 153/76 53 170/77 52     GEN: Well nourished, well developed, in no acute distress. Neck: No JVD or carotid bruits. Cardiac:  RRR. No murmurs. No rubs or gallops.   Respiratory:  Respirations regular and unlabored. Clear to auscultation without rales, wheezing or rhonchi. GI: Soft, nontender, nondistended. Extremities: Radials/DP/PT 2+ and equal bilaterally. No clubbing or cyanosis. No edema.  Skin: Warm and dry, no rash. Neuro: Strength intact.  Assessment & Plan   PAF Echo December 2024 showed normal LV/RV function, severe LAE, mild to moderate MR.  S/p DCCV 02/26/2023.  Patient denies palpitations or feeling of arrhythmia since DCCV.  Denies spontaneous bleeding  concerns. EKG today shows sinus bradycardia.  -Continue Eliquis. Appropriate Eliquis dose.   -Decrease Toprol to 25 mg bid.   Lower extremity edema Patient reports improved lower extremity edema since starting Lasix. Euvolemic and well compensated on exam.  -Continue Lasix as needed for weight gain  Hypertension BP today 150/70. No headache reported.  -Continue telmisartan and Toprol (as above).  Recent fall Patient fell at home as described in HPI. She has had no further dizziness since that day. She denies chest pain, shortness of breath or palpitations prior to fall. Home BP 100-120/60s. She is hypertensive today. She had a drop of 23 mmHg in SBP during orthostatic vitals today without symptoms.  -Continue telmisartan. -Decrease Toprol as above and change Lasix to as needed for weight gain.   Hyperlipidemia LDL October 2024 58, at goal. -Continue pravastatin.  Disposition: Decrease Toprol to 25 mg bid, take Lasix  prn for weight gain of 3 lb overnight or 5 lb in a week. Weigh daily. Return for previously scheduled visit on 3/4 or sooner as needed.          Signed, Etta Grandchild. Alizabeth Antonio, DNP, NP-C

## 2023-03-08 ENCOUNTER — Ambulatory Visit: Payer: Medicare PPO | Attending: Student | Admitting: Student

## 2023-03-08 ENCOUNTER — Encounter: Payer: Self-pay | Admitting: Student

## 2023-03-08 VITALS — BP 150/70 | HR 54 | Ht 64.0 in | Wt 187.0 lb

## 2023-03-08 DIAGNOSIS — I10 Essential (primary) hypertension: Secondary | ICD-10-CM | POA: Diagnosis not present

## 2023-03-08 DIAGNOSIS — I480 Paroxysmal atrial fibrillation: Secondary | ICD-10-CM | POA: Diagnosis not present

## 2023-03-08 DIAGNOSIS — E782 Mixed hyperlipidemia: Secondary | ICD-10-CM | POA: Diagnosis not present

## 2023-03-08 DIAGNOSIS — R600 Localized edema: Secondary | ICD-10-CM | POA: Diagnosis not present

## 2023-03-08 DIAGNOSIS — W19XXXA Unspecified fall, initial encounter: Secondary | ICD-10-CM | POA: Diagnosis not present

## 2023-03-08 MED ORDER — FUROSEMIDE 20 MG PO TABS: 20.0000 mg | ORAL_TABLET | Freq: Every day | ORAL | 3 refills | Status: DC | PRN

## 2023-03-08 MED ORDER — METOPROLOL SUCCINATE ER 25 MG PO TB24: 25.0000 mg | ORAL_TABLET | Freq: Two times a day (BID) | ORAL | 3 refills | Status: DC

## 2023-03-08 NOTE — Patient Instructions (Signed)
Medication Instructions:  Your physician recommends the following medication changes.  START TAKING: Lasix 20 mg daily as needed for weight gain of greater than: 3 pounds in one day or 5 pounds in one week  DECREASE: Toprol-XL 25 mg twice daily  *If you need a refill on your cardiac medications before your next appointment, please call your pharmacy*   Lab Work: None ordered at this time  If you have labs (blood work) drawn today and your tests are completely normal, you will receive your results only by: MyChart Message (if you have MyChart) OR A paper copy in the mail If you have any lab test that is abnormal or we need to change your treatment, we will call you to review the results.   Follow-Up: At [facility], you and your health needs are our priority.  As part of our continuing mission to provide you with exceptional heart care, we have created designated Provider Care Teams.  These Care Teams include your primary Cardiologist (physician) and Advanced Practice Providers (APPs -  Physician Assistants and Nurse Practitioners) who all work together to provide you with the care you need, when you need it.    Your next appointment:   Is with Dr Mariah Milling on Apr 16, 2023

## 2023-03-22 ENCOUNTER — Ambulatory Visit
Admission: RE | Admit: 2023-03-22 | Discharge: 2023-03-22 | Disposition: A | Discharge status: Home / Self Care | Payer: Medicare PPO | Attending: Internal Medicine | Admitting: Internal Medicine

## 2023-03-22 ENCOUNTER — Ambulatory Visit
Admission: RE | Admit: 2023-03-22 | Discharge: 2023-03-22 | Disposition: A | Discharge status: Home / Self Care | Payer: Medicare PPO | Source: Ambulatory Visit | Attending: Internal Medicine | Admitting: Internal Medicine

## 2023-03-22 ENCOUNTER — Ambulatory Visit: Payer: Medicare PPO | Admitting: Internal Medicine

## 2023-03-22 VITALS — BP 126/70 | HR 76 | Temp 98.0°F | Resp 16 | Ht 64.0 in | Wt 187.2 lb

## 2023-03-22 DIAGNOSIS — I4819 Other persistent atrial fibrillation: Secondary | ICD-10-CM | POA: Diagnosis not present

## 2023-03-22 DIAGNOSIS — E7800 Pure hypercholesterolemia, unspecified: Secondary | ICD-10-CM | POA: Diagnosis not present

## 2023-03-22 DIAGNOSIS — E1165 Type 2 diabetes mellitus with hyperglycemia: Secondary | ICD-10-CM | POA: Diagnosis not present

## 2023-03-22 DIAGNOSIS — R5383 Other fatigue: Secondary | ICD-10-CM

## 2023-03-22 DIAGNOSIS — R918 Other nonspecific abnormal finding of lung field: Secondary | ICD-10-CM | POA: Diagnosis not present

## 2023-03-22 DIAGNOSIS — R062 Wheezing: Secondary | ICD-10-CM | POA: Insufficient documentation

## 2023-03-22 DIAGNOSIS — I10 Essential (primary) hypertension: Secondary | ICD-10-CM | POA: Diagnosis not present

## 2023-03-22 DIAGNOSIS — F439 Reaction to severe stress, unspecified: Secondary | ICD-10-CM | POA: Diagnosis not present

## 2023-03-22 LAB — BASIC METABOLIC PANEL
BUN: 16 mg/dL (ref 6–23)
CO2: 28 meq/L (ref 19–32)
Calcium: 9.1 mg/dL (ref 8.4–10.5)
Chloride: 107 meq/L (ref 96–112)
Creatinine, Ser: 0.72 mg/dL (ref 0.40–1.20)
GFR: 78.25 mL/min (ref >60.00)
Glucose, Bld: 100 mg/dL — ABNORMAL HIGH (ref 70–99)
Potassium: 4.1 meq/L (ref 3.5–5.1)
Sodium: 145 meq/L (ref 135–145)

## 2023-03-22 LAB — CBC WITH DIFFERENTIAL/PLATELET
Basophils Absolute: 0.1 10*3/uL (ref 0.0–0.1)
Basophils Relative: 0.9 % (ref 0.0–3.0)
Eosinophils Absolute: 0.2 10*3/uL (ref 0.0–0.7)
Eosinophils Relative: 2.3 % (ref 0.0–5.0)
HCT: 36.7 % (ref 36.0–46.0)
Hemoglobin: 12.1 g/dL (ref 12.0–15.0)
Lymphocytes Relative: 21.6 % (ref 12.0–46.0)
Lymphs Abs: 1.5 10*3/uL (ref 0.7–4.0)
MCHC: 33 g/dL (ref 30.0–36.0)
MCV: 93 fL (ref 78.0–100.0)
Monocytes Absolute: 0.7 10*3/uL (ref 0.1–1.0)
Monocytes Relative: 10.4 % (ref 3.0–12.0)
Neutro Abs: 4.5 10*3/uL (ref 1.4–7.7)
Neutrophils Relative %: 64.8 % (ref 43.0–77.0)
Platelets: 256 10*3/uL (ref 150.0–400.0)
RBC: 3.95 Mil/uL (ref 3.87–5.11)
RDW: 13.8 % (ref 11.5–15.5)
WBC: 6.9 10*3/uL (ref 4.0–10.5)

## 2023-03-22 LAB — HEPATIC FUNCTION PANEL
ALT: 14 U/L (ref 0–35)
AST: 15 U/L (ref 0–37)
Albumin: 4.3 g/dL (ref 3.5–5.2)
Alkaline Phosphatase: 60 U/L (ref 39–117)
Bilirubin, Direct: 0.3 mg/dL (ref 0.0–0.3)
Total Bilirubin: 1.5 mg/dL — ABNORMAL HIGH (ref 0.2–1.2)
Total Protein: 6.8 g/dL (ref 6.0–8.3)

## 2023-03-22 NOTE — Progress Notes (Signed)
 Subjective:    Patient ID: Lori Guzman, female    DOB: 10-Oct-1940, 83 y.o.   MRN: [id number]  Patient here for follow up appt.   HPI Here for follow up - recent falls. [place] 02/28/23 - walking to her mail box. Felt dizzy, stumbled and fell. Landed face first on gravel. Denies syncope. Hit her right knee and forehead. S/p cardioversion 02/26/23. Reports that after her cardioversion, she did not feel well - decreased appetite, tired and weak. Went to ER, but left before being seen. CT head - no acute intracranial abnormality. Mild soft tissue swelling of the left frontal bone. No fracture. No traumatic injury to the cervical spine. Saw cardiology 03/08/23 - recommended to continue eliquis  and decrease toprol  to 25mg  bid. Change lasix  to prn and continue telmisartan . She is feeling better. Reports her heart rate has been 55-60 resting and 75-80 walking. No chest pain.   Eating. No abdominal pain reported. Some wheezing intermittent.    Past Medical History:  Diagnosis Date   Allergy    reoccuring problems   GERD (gastroesophageal reflux disease)    History of colon polyps    adenomatous   Hyperlipidemia    Hypertension    Past Surgical History:  Procedure Laterality Date   BARTHOLIN GLAND CYST EXCISION     CARDIOVERSION N/A 02/26/2023   Procedure: CARDIOVERSION;  Surgeon: Perla Evalene PARAS, MD;  Location: [facility] ORS;  Service: Cardiovascular;  Laterality: N/A;   CHOLECYSTECTOMY     NASAL POLYP SURGERY  1964   TONSILECTOMY/ADENOIDECTOMY WITH MYRINGOTOMY  1950   Family History  Problem Relation Age of Onset   Heart disease Mother    CVA Mother    Hypertension Mother    Alzheimer's disease Mother    Esophageal cancer Father    Breast cancer Neg Hx    Colon cancer Neg Hx    Social History   Socioeconomic History   Marital status: Widowed    Spouse name: Not on file   Number of children: Not on file   Years of education: Not on file   Highest education level: Associate degree:  academic program  Occupational History   Not on file  Tobacco Use   Smoking status: Never    Passive exposure: Past   Smokeless tobacco: Never  Vaping Use   Vaping status: Never Used  Substance and Sexual Activity   Alcohol use: No    Alcohol/week: 0.0 standard drinks of alcohol   Drug use: No   Sexual activity: Not on file  Other Topics Concern   Not on file  Social History Narrative   She is married, has 3 children and is retired.   Social Drivers of Corporate Investment Banker Strain: Low Risk  (01/24/2023)   Overall Financial Resource Strain (CARDIA)    Difficulty of Paying Living Expenses: Not very hard  Food Insecurity: No Food Insecurity (01/24/2023)   Hunger Vital Sign    Worried About Running Out of Food in the Last Year: Never true    Ran Out of Food in the Last Year: Never true  Transportation Needs: No Transportation Needs (01/24/2023)   PRAPARE - Administrator, Civil Service (Medical): No    Lack of Transportation (Non-Medical): No  Physical Activity: Insufficiently Active (01/24/2023)   Exercise Vital Sign    Days of Exercise per Week: 3 days    Minutes of Exercise per Session: 10 min  Stress: No Stress Concern Present (01/24/2023)  [employer] of Occupational Health - Occupational Stress Questionnaire    Feeling of Stress : Not at all  Social Connections: Moderately Isolated (01/24/2023)   Social Connection and Isolation Panel [NHANES]    Frequency of Communication with Friends and Family: More than three times a week    Frequency of Social Gatherings with Friends and Family: More than three times a week    Attends Religious Services: More than 4 times per year    Active Member of [employer] or Organizations: No    Attends Banker Meetings: Never    Marital Status: Widowed     Review of Systems  Constitutional:  Negative for appetite change and unexpected weight change.  HENT:  Negative for congestion and sinus pressure.    Respiratory:  Negative for cough, chest tightness and shortness of breath.   Cardiovascular:  Negative for chest pain and palpitations.  Gastrointestinal:  Negative for abdominal pain, diarrhea, nausea and vomiting.  Genitourinary:  Negative for difficulty urinating and dysuria.  Musculoskeletal:  Negative for joint swelling and myalgias.  Skin:  Negative for color change and rash.  Neurological:  Negative for dizziness and headaches.  Psychiatric/Behavioral:  Negative for agitation and dysphoric mood.        Objective:     BP 126/70   Pulse 76   Temp 98 F (36.7 C)   Resp 16   Ht 5' 4 (1.626 m)   Wt 187 lb 3.2 oz (84.9 kg)   LMP 02/29/1992   SpO2 97%   BMI 32.13 kg/m  Wt Readings from Last 3 Encounters:  03/22/23 187 lb 3.2 oz (84.9 kg)  03/08/23 187 lb (84.8 kg)  02/21/23 187 lb 6 oz (85 kg)    Physical Exam Vitals reviewed.  Constitutional:      General: She is not in acute distress.    Appearance: Normal appearance.  HENT:     Head: Normocephalic and atraumatic.     Right Ear: External ear normal.     Left Ear: External ear normal.     Mouth/Throat:     Pharynx: No oropharyngeal exudate or posterior oropharyngeal erythema.  Eyes:     General: No scleral icterus.       Right eye: No discharge.        Left eye: No discharge.     Conjunctiva/sclera: Conjunctivae normal.  Neck:     Thyroid : No thyromegaly.  Cardiovascular:     Rate and Rhythm: Normal rate and regular rhythm.  Pulmonary:     Effort: No respiratory distress.     Breath sounds: Normal breath sounds. No wheezing.  Abdominal:     General: Bowel sounds are normal.     Palpations: Abdomen is soft.     Tenderness: There is no abdominal tenderness.  Musculoskeletal:        General: No swelling or tenderness.     Cervical back: Neck supple. No tenderness.  Lymphadenopathy:     Cervical: No cervical adenopathy.  Skin:    Findings: No erythema or rash.  Neurological:     Mental Status: She is  alert.  Psychiatric:        Mood and Affect: Mood normal.        Behavior: Behavior normal.         Outpatient Encounter Medications as of 03/22/2023  Medication Sig   ELIQUIS  5 MG TABS tablet TAKE 1 TABLET BY MOUTH TWICE A DAY   escitalopram  (LEXAPRO ) 20 MG tablet Take 1  tablet (20 mg total) by mouth daily.   fluticasone (FLONASE) 50 MCG/ACT nasal spray Place 1 spray into both nostrils daily as needed (Congestion).   furosemide  (LASIX ) 20 MG tablet Take 1 tablet (20 mg total) by mouth daily as needed (weight gain of greater than: 3 pounds in one day or 5 pounds in one week).   metoprolol  succinate (TOPROL -XL) 25 MG 24 hr tablet Take 1 tablet (25 mg total) by mouth 2 (two) times daily.   Multiple Vitamins-Minerals (ICAPS AREDS FORMULA PO) Take 1 tablet by mouth daily.   Polyethyl Glycol-Propyl Glycol (SYSTANE) 0.4-0.3 % SOLN Place 1 drop into both eyes daily as needed (Dry eye).   potassium chloride  (KLOR-CON ) 10 MEQ tablet Take 1 tablet (10 mEq total) by mouth daily. Take 1 tablet (10 mEq) with lasix .   pravastatin  (PRAVACHOL ) 10 MG tablet TAKE ONE TABLET EVERY MONDAY, WEDNESDAY AND FRIDAY   Probiotic Product (PROBIOTIC PO) Take 1 capsule by mouth daily.   telmisartan  (MICARDIS ) 80 MG tablet Take 1 tablet (80 mg total) by mouth daily.   No facility-administered encounter medications on file as of 03/22/2023.     Lab Results  Component Value Date   WBC 6.9 03/22/2023   HGB 12.1 03/22/2023   HCT 36.7 03/22/2023   PLT 256.0 03/22/2023   GLUCOSE 100 (H) 03/22/2023   CHOL 123 11/20/2022   TRIG 46.0 11/20/2022   HDL 55.60 11/20/2022   LDLCALC 58 11/20/2022   ALT 14 03/22/2023   AST 15 03/22/2023   NA 145 03/22/2023   K 4.1 03/22/2023   CL 107 03/22/2023   CREATININE 0.72 03/22/2023   BUN 16 03/22/2023   CO2 28 03/22/2023   TSH 2.85 03/20/2022   HGBA1C 6.8 (H) 11/20/2022   MICROALBUR 2.0 (H) 07/24/2022       Assessment & Plan:  Wheezing Assessment & Plan: Noticed some  occasional wheezing. Some fatigue. Will check cxr to confirm lungs clear.   Orders: -     DG Chest 2 View; Future  Other fatigue -     CBC with Differential/Platelet -     Basic metabolic panel -     Hepatic function panel  Persistent atrial fibrillation [facility]) Assessment & Plan: S/p cardioversion 02/26/23. Continues on eliquis  and metoprolol . Metoprolol  dose reduced recently. Appears to be in SR.    Type 2 diabetes mellitus with hyperglycemia, without long-term current use of insulin (HCC) Assessment & Plan: Low carb diet and exercise given elevated blood sugars.  On no medication.   Follow met b and a1c.   Lab Results  Component Value Date   HGBA1C 6.8 (H) 11/20/2022     Hypercholesterolemia Assessment & Plan: Continue pravastatin . Follow lipid panel and liver function tests.    Primary hypertension Assessment & Plan: Blood pressure as outlined.  Continue micardis .  Also on metoprolol . Dose just reduced as outlined.  Follow pressures.  Follow metabolic panel.    Stress Assessment & Plan: On lexapro  20mg .  Appears to be doing relatively well.  Continue current medication.        Allena Hamilton, MD

## 2023-03-24 ENCOUNTER — Encounter: Payer: Self-pay | Admitting: Internal Medicine

## 2023-03-24 DIAGNOSIS — R062 Wheezing: Secondary | ICD-10-CM | POA: Insufficient documentation

## 2023-03-24 NOTE — Assessment & Plan Note (Addendum)
 S/p cardioversion 02/26/23. Continues on eliquis  and metoprolol . Metoprolol  dose reduced recently. Appears to be in SR.

## 2023-03-24 NOTE — Assessment & Plan Note (Addendum)
 Blood pressure as outlined.  Continue micardis .  Also on metoprolol . Dose just reduced as outlined.  Follow pressures.  Follow metabolic panel.

## 2023-03-24 NOTE — Assessment & Plan Note (Signed)
 Noticed some occasional wheezing. Some fatigue. Will check cxr to confirm lungs clear.

## 2023-03-24 NOTE — Assessment & Plan Note (Signed)
Continue pravastatin. Follow lipid panel and liver function tests.  

## 2023-03-24 NOTE — Assessment & Plan Note (Signed)
Low carb diet and exercise given elevated blood sugars.  On no medication.   Follow met b and a1c.   Lab Results  Component Value Date   HGBA1C 6.8 (H) 11/20/2022

## 2023-03-24 NOTE — Assessment & Plan Note (Signed)
On lexapro 20mg .  Appears to be doing relatively well.  Continue current medication.

## 2023-03-26 ENCOUNTER — Other Ambulatory Visit: Payer: Self-pay | Admitting: Internal Medicine

## 2023-03-26 DIAGNOSIS — R9389 Abnormal findings on diagnostic imaging of other specified body structures: Secondary | ICD-10-CM

## 2023-03-26 NOTE — Progress Notes (Signed)
Order placed for pulmonary referral.

## 2023-04-02 ENCOUNTER — Encounter: Payer: Self-pay | Admitting: Internal Medicine

## 2023-04-02 ENCOUNTER — Encounter: Payer: Self-pay | Admitting: Pulmonary Disease

## 2023-04-02 ENCOUNTER — Ambulatory Visit: Payer: Medicare PPO | Admitting: Pulmonary Disease

## 2023-04-02 VITALS — BP 158/70 | HR 69 | Temp 97.8°F | Ht 64.0 in | Wt 184.0 lb

## 2023-04-02 DIAGNOSIS — R0609 Other forms of dyspnea: Secondary | ICD-10-CM

## 2023-04-02 DIAGNOSIS — I340 Nonrheumatic mitral (valve) insufficiency: Secondary | ICD-10-CM | POA: Diagnosis not present

## 2023-04-02 DIAGNOSIS — J8410 Pulmonary fibrosis, unspecified: Secondary | ICD-10-CM

## 2023-04-02 DIAGNOSIS — I272 Pulmonary hypertension, unspecified: Secondary | ICD-10-CM | POA: Diagnosis not present

## 2023-04-02 NOTE — Progress Notes (Signed)
Subjective:    Patient ID: Lori Guzman, female    DOB: 11-Mar-1940, 83 y.o.   MRN: [id number]  Patient Care Team: Dale Glenwood, MD as PCP - General (Internal Medicine)  Chief Complaint  Patient presents with   Consult    Shortness of breath on exertion. No cough or wheezing.     BACKGROUND: Patient presents for evaluation of dyspnea and abnormal CT chest suggestive of interstitial lung disease.  She was previously evaluated by Dr. 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  Patient has requested follow-up in [place].  She is kindly referred by Dr. Dale Wilton.  HPI Discussed the use of AI scribe software for clinical note transcription with the patient, who gave verbal consent to proceed.  History of Present Illness   Lori Guzman is an 83 year old female with asthma who presents with dyspnea on exertion. She was referred by Dr. Dale Coxton for evaluation of an abnormal CT chest.  She experiences dyspnea primarily when walking outside, noting that she breathes well indoors but finds it harder to breathe when walking down the road. She has a history of asthma and seasonal allergies, for which she previously received allergy shots for seven years. She describes a sensation of having a 'frog in her throat' recently, which she attributes to possible allergies. No chest pain, shortness of breath, or palpitations at rest. She denies daily use of medication for reflux but occasionally takes an over-the-counter antacid for heartburn.  In November 2023, she was treated for pneumonia with two rounds of antibiotics and prednisone, and was informed of lung scarring. A recent chest x-ray suggested possible progression of this scarring, leading to the current referral.  In January 2024, she experienced an episode of atrial fibrillation and underwent cardioversion, which required two shocks to restore  rhythm. Following the procedure, she felt weak and experienced a fall, resulting in head and leg injuries. Her medication, metoprolol, was reduced after the fall. She reports a resting heart rate of 39-41 bpm and blood pressure readings around 110/60 mmHg during this period.  She has a history of headaches, which she attributes to the fall, and takes Tylenol as needed. No known allergies and has never smoked due to her allergies.  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 She has no pets at home.   She does not endorse any other symptomatology.  Review of Systems A 10 point review of systems was performed and it is as noted above otherwise negative.   Past Medical History:  Diagnosis Date   Allergy    reoccuring problems   GERD (gastroesophageal reflux disease)    History of colon polyps    adenomatous   Hyperlipidemia    Hypertension     Past Surgical History:  Procedure Laterality Date   BARTHOLIN GLAND CYST EXCISION     CARDIOVERSION N/A 02/26/2023   Procedure: CARDIOVERSION;  Surgeon: Antonieta Iba, MD;  Location: [facility] ORS;  Service: Cardiovascular;  Laterality: N/A;   CHOLECYSTECTOMY     NASAL POLYP SURGERY  1964   TONSILECTOMY/ADENOIDECTOMY WITH MYRINGOTOMY  1950    Patient Active Problem List   Diagnosis Date Noted   Wheezing 03/24/2023   SOB (shortness of breath) 02/26/2023   Atrial fibrillation (HCC) 11/24/2022   Irregular heart beat 11/23/2022   Hyperbilirubinemia 11/23/2022   Post-nasal drainage 07/29/2022   Abnormal CXR 07/24/2022  Acute cough 07/19/2022   Pneumonia 01/14/2022   Chronic bronchitis (HCC) 12/29/2021   Itching 11/05/2021   Otitis media 10/13/2021   Rash 03/19/2021   Muscle ache 03/16/2021   Right knee pain 09/27/2020   Sinus congestion 08/15/2020   Thumb pain, left 03/27/2020   Lower abdominal pain 03/03/2017   Dizziness 02/03/2016   Neck pain 01/15/2016   Fatigue 08/22/2015   Near syncope 08/22/2015    Light headedness 08/10/2014   BMI 31.0-31.9,adult 04/05/2014   Health care maintenance 04/05/2014   Back pain 12/06/2013   Hypercholesterolemia 12/06/2013   Gum lesion 07/19/2013   Stress 01/21/2013   Diabetes (HCC) 08/15/2012   GERD (gastroesophageal reflux disease) 06/09/2012   History of colonic polyps 06/09/2012   Hypertension 12/17/2011    Family History  Problem Relation Age of Onset   Heart disease Mother    CVA Mother    Hypertension Mother    Alzheimer's disease Mother    Esophageal cancer Father    Breast cancer Neg Hx    Colon cancer Neg Hx     Social History   Tobacco Use   Smoking status: Never    Passive exposure: Past   Smokeless tobacco: Never  Substance Use Topics   Alcohol use: No    Alcohol/week: 0.0 standard drinks of alcohol    Allergies  Allergen Reactions   Other Itching and Dermatitis    Seasonal allergies    Current Meds  Medication Sig   ELIQUIS 5 MG TABS tablet TAKE 1 TABLET BY MOUTH TWICE A DAY   escitalopram (LEXAPRO) 20 MG tablet Take 1 tablet (20 mg total) by mouth daily.   fluticasone (FLONASE) 50 MCG/ACT nasal spray Place 1 spray into both nostrils daily as needed (Congestion).   metoprolol succinate (TOPROL-XL) 25 MG 24 hr tablet Take 1 tablet (25 mg total) by mouth 2 (two) times daily.   Multiple Vitamins-Minerals (ICAPS AREDS FORMULA PO) Take 1 tablet by mouth daily.   Polyethyl Glycol-Propyl Glycol (SYSTANE) 0.4-0.3 % SOLN Place 1 drop into both eyes daily as needed (Dry eye).   pravastatin (PRAVACHOL) 10 MG tablet TAKE ONE TABLET EVERY MONDAY, WEDNESDAY AND FRIDAY   Probiotic Product (PROBIOTIC PO) Take 1 capsule by mouth daily.   telmisartan (MICARDIS) 80 MG tablet Take 1 tablet (80 mg total) by mouth daily.    Immunization History  Administered Date(s) Administered   Fluad Quad(high Dose 65+) 01/13/2020, 10/30/2021   Influenza Split 02/29/2012   Influenza, High Dose Seasonal PF 12/05/2016, 12/29/2018    Influenza,inj,Quad PF,6+ Mos 01/19/2013, 11/30/2013, 12/09/2014, 11/08/2017   Influenza-Unspecified 11/15/2015, 11/12/2020, 11/23/2022   PFIZER(Purple Top)SARS-COV-2 Vaccination 04/01/2019, 04/27/2019   Pneumococcal Conjugate-13 08/09/2014   Pneumococcal Polysaccharide-23 06/09/2012        Objective:     BP (!) 158/70 (BP Location: Left Arm, Patient Position: Sitting, Cuff Size: Normal)   Pulse 69   Temp 97.8 F (36.6 C) (Temporal)   Ht 5\' 4"  (1.626 m)   Wt 184 lb (83.5 kg)   LMP 02/29/1992   SpO2 97%   BMI 31.58 kg/m   SpO2: 97 %  GENERAL: Overweight woman, no acute distress, fully ambulatory, no conversational dyspnea. HEAD: Normocephalic, atraumatic.  EYES: Pupils equal, round, reactive to light.  No scleral icterus.  MOUTH: Dentition intact, oral mucosa moist.  No thrush. NECK: Supple. No thyromegaly. Trachea midline. No JVD.  No adenopathy. PULMONARY: Good air entry bilaterally.  Coarse, otherwise, no adventitious sounds. CARDIOVASCULAR: S1 and S2. Regular rate and rhythm.  Grade 2/6 mitral regurgitation murmur. ABDOMEN: Obese, otherwise benign. MUSCULOSKELETAL: No joint deformity, no clubbing, no edema.  NEUROLOGIC: No overt focal deficit, no gait disturbance, speech is fluent. SKIN: Intact,warm,dry. PSYCH: Mood and behavior normal.  Ambulatory oxymetry was performed today:  At rest on room air oxygen saturation was 94%, the patient ambulated at a moderate pace, completed 3 laps, O2 nadir 91%, no shortness of breath.  Resting heart rate was 60 bpm at maximum for this exercise 84 bpm.       Assessment & Plan:     ICD-10-CM   1. Dyspnea on exertion  R06.09 Pulmonary Function Test [facility] Only    CT Chest High Resolution    6 minute walk    2. Postinflammatory pulmonary fibrosis (HCC)  J84.10 Pulmonary Function Test [facility] Only    CT Chest High Resolution    6 minute walk    3. Pulmonary HTN (HCC)  I27.20     4. Nonrheumatic mitral valve regurgitation  I34.0        Orders Placed This Encounter  Procedures   CT Chest High Resolution    Standing Status:   Future    Expected Date:   04/16/2023    Expiration Date:   04/01/2024    Preferred imaging location?:   [facility]   Pulmonary Function Test [facility] Only    Standing Status:   Future    Expiration Date:   04/01/2024    Full PFT: includes the following: basic spirometry, spirometry pre & post bronchodilator, diffusion capacity (DLCO), lung volumes:   Full PFT    This test can only be performed at:   [facility]   6 minute walk    Standing Status:   Future    Expiration Date:   04/01/2024    Where should this test be performed?:   [facility]   Discussion:    Dyspnea on exertion Presents with dyspnea on exertion, worsening with physical activity such as walking outside. Oxygen levels drop slightly but remain above 90%. Differential diagnosis includes pulmonary scarring, pulmonary hypertension, and cardiac issues such as a mitral valve disease. Further evaluation with high-resolution CT and pulmonary function tests is needed to assess the extent of scarring and its impact on respiratory function. The six-minute walk test will help evaluate her exercise tolerance and oxygen levels during activity. - Order high-resolution CT scan of the chest - Perform pulmonary function tests - Conduct a six-minute walk test  Pulmonary scarring/postinflammatory pulmonary fibrosis Pneumonia in November 2023 with subsequent scarring noted on imaging. Recent chest CT shows potential increase in scarring. Further evaluation needed to assess the extent and impact on respiratory function. High-resolution CT will provide detailed images to better understand the scarring. - Order high-resolution CT scan of the chest  Pulmonary hypertension Echocardiogram from December shows elevated pulmonary artery pressure and a leaky valve, contributing to dyspnea. Further evaluation required to determine severity and appropriate  management. The leaky valve allows blood to flow backward, potentially causing shortness of breath. Ongoing monitoring and potential treatments based on severity were discussed. - Evaluate pulmonary hypertension - May need right heart cath - Monitor for symptoms of heart failure  Atrial fibrillation (AFib) AFib with recent cardioversion on February 26, 2023. Post-procedure, experienced significant weakness and a fall, possibly due to low blood pressure and bradycardia. Current medication includes metoprolol, which was reduced by another provider. Importance of monitoring heart rate and blood pressure to prevent further episodes of weakness and falls was discussed. - Monitor  heart rate and blood pressure - Follow up with cardiologist as needed  General Health Maintenance 83 years old with seasonal allergies and occasional heartburn. No smoking history or significant occupational exposures. No known allergies. Discussed managing seasonal allergies and as-needed use of antacids for heartburn. - Advise on managing seasonal allergies - Recommend as-needed use of antacids for heartburn  Follow-up - Schedule follow-up appointment in 6-8 weeks to review test results and reassess symptoms.     Advised if symptoms do not improve or worsen, to please contact office for sooner follow up or seek emergency care.    I spent 42 minutes of dedicated to the care of this patient on the date of this encounter to include pre-visit review of records, face-to-face time with the patient discussing conditions above, post visit ordering of testing, clinical documentation with the electronic health record, making appropriate referrals as documented, and communicating necessary findings to members of the patients care team.   C. Danice Goltz, MD Advanced Bronchoscopy PCCM [facility]    *This note was dictated using voice recognition software/Dragon.  Despite best efforts to proofread, errors can  occur which can change the meaning. Any transcriptional errors that result from this process are unintentional and may not be fully corrected at the time of dictation.

## 2023-04-02 NOTE — Patient Instructions (Signed)
VISIT SUMMARY:  Lori Guzman, an 83 year old female with a history of asthma, presented with difficulty breathing during physical activity, particularly when walking outside. She was referred for evaluation due to an abnormal chest CT scan. She has a history of pneumonia, atrial fibrillation, and exposure to asbestos. Recent tests and treatments were discussed, and further evaluations were planned.  YOUR PLAN:  -DYSPNEA ON EXERTION: Dyspnea on exertion means experiencing shortness of breath during physical activity. To understand the cause, we will perform a high-resolution CT scan of your chest, pulmonary function tests, and a six-minute walk test to evaluate your exercise tolerance and oxygen levels during activity.  -PULMONARY SCARRING: Pulmonary scarring refers to the formation of scar tissue in the lungs, often due to previous infections like pneumonia. We will conduct a high-resolution CT scan to assess the extent of the scarring and its impact on your breathing.  -PULMONARY HYPERTENSION: Pulmonary hypertension is high blood pressure in the arteries of your lungs, which can cause shortness of breath. We will evaluate the severity of this condition and monitor for symptoms of heart failure.  -ATRIAL FIBRILLATION (AFIB): Atrial fibrillation is an irregular and often rapid heart rate that can lead to poor blood flow. After your recent cardioversion, it is important to monitor your heart rate and blood pressure to prevent further episodes of weakness and falls. Follow up with your cardiologist as needed.  -GENERAL HEALTH MAINTENANCE: We discussed managing your seasonal allergies and recommended using antacids as needed for heartburn. Continue to avoid smoking and maintain a healthy lifestyle.  INSTRUCTIONS:  Please schedule a follow-up appointment in 6-8 weeks to review your test results and reassess your symptoms.

## 2023-04-05 ENCOUNTER — Other Ambulatory Visit: Payer: Medicare PPO

## 2023-04-06 ENCOUNTER — Other Ambulatory Visit: Payer: Self-pay | Admitting: Internal Medicine

## 2023-04-08 ENCOUNTER — Other Ambulatory Visit (INDEPENDENT_AMBULATORY_CARE_PROVIDER_SITE_OTHER): Payer: Medicare PPO

## 2023-04-08 DIAGNOSIS — E1165 Type 2 diabetes mellitus with hyperglycemia: Secondary | ICD-10-CM | POA: Diagnosis not present

## 2023-04-08 DIAGNOSIS — E7800 Pure hypercholesterolemia, unspecified: Secondary | ICD-10-CM | POA: Diagnosis not present

## 2023-04-08 DIAGNOSIS — I10 Essential (primary) hypertension: Secondary | ICD-10-CM | POA: Diagnosis not present

## 2023-04-08 LAB — LIPID PANEL
Cholesterol: 135 mg/dL (ref 0–200)
HDL: 52.8 mg/dL (ref >39.00)
LDL Cholesterol: 73 mg/dL (ref 0–99)
NonHDL: 82.6
Total CHOL/HDL Ratio: 3
Triglycerides: 46 mg/dL (ref 0.0–149.0)
VLDL: 9.2 mg/dL (ref 0.0–40.0)

## 2023-04-08 LAB — HEPATIC FUNCTION PANEL
ALT: 14 U/L (ref 0–35)
AST: 15 U/L (ref 0–37)
Albumin: 4.3 g/dL (ref 3.5–5.2)
Alkaline Phosphatase: 50 U/L (ref 39–117)
Bilirubin, Direct: 0.3 mg/dL (ref 0.0–0.3)
Total Bilirubin: 1.7 mg/dL — ABNORMAL HIGH (ref 0.2–1.2)
Total Protein: 7 g/dL (ref 6.0–8.3)

## 2023-04-08 LAB — BASIC METABOLIC PANEL
BUN: 18 mg/dL (ref 6–23)
CO2: 29 meq/L (ref 19–32)
Calcium: 9.2 mg/dL (ref 8.4–10.5)
Chloride: 104 meq/L (ref 96–112)
Creatinine, Ser: 0.66 mg/dL (ref 0.40–1.20)
GFR: 82.07 mL/min (ref >60.00)
Glucose, Bld: 113 mg/dL — ABNORMAL HIGH (ref 70–99)
Potassium: 4.3 meq/L (ref 3.5–5.1)
Sodium: 141 meq/L (ref 135–145)

## 2023-04-08 LAB — CBC WITH DIFFERENTIAL/PLATELET
Basophils Absolute: 0 10*3/uL (ref 0.0–0.1)
Basophils Relative: 0.7 % (ref 0.0–3.0)
Eosinophils Absolute: 0.1 10*3/uL (ref 0.0–0.7)
Eosinophils Relative: 2.4 % (ref 0.0–5.0)
HCT: 38.1 % (ref 36.0–46.0)
Hemoglobin: 12.7 g/dL (ref 12.0–15.0)
Lymphocytes Relative: 29.8 % (ref 12.0–46.0)
Lymphs Abs: 1.6 10*3/uL (ref 0.7–4.0)
MCHC: 33.3 g/dL (ref 30.0–36.0)
MCV: 92.3 fL (ref 78.0–100.0)
Monocytes Absolute: 0.5 10*3/uL (ref 0.1–1.0)
Monocytes Relative: 9.2 % (ref 3.0–12.0)
Neutro Abs: 3.2 10*3/uL (ref 1.4–7.7)
Neutrophils Relative %: 57.9 % (ref 43.0–77.0)
Platelets: 220 10*3/uL (ref 150.0–400.0)
RBC: 4.13 Mil/uL (ref 3.87–5.11)
RDW: 14 % (ref 11.5–15.5)
WBC: 5.5 10*3/uL (ref 4.0–10.5)

## 2023-04-08 LAB — HEMOGLOBIN A1C: Hgb A1c MFr Bld: 6.7 % — ABNORMAL HIGH (ref 4.6–6.5)

## 2023-04-08 LAB — TSH: TSH: 2.39 u[IU]/mL (ref 0.35–5.50)

## 2023-04-10 ENCOUNTER — Ambulatory Visit: Payer: Medicare PPO | Admitting: Internal Medicine

## 2023-04-10 VITALS — BP 122/76 | HR 67 | Temp 98.0°F | Resp 16 | Ht 64.0 in | Wt 183.8 lb

## 2023-04-10 DIAGNOSIS — I4819 Other persistent atrial fibrillation: Secondary | ICD-10-CM | POA: Diagnosis not present

## 2023-04-10 DIAGNOSIS — I10 Essential (primary) hypertension: Secondary | ICD-10-CM | POA: Diagnosis not present

## 2023-04-10 DIAGNOSIS — E7800 Pure hypercholesterolemia, unspecified: Secondary | ICD-10-CM | POA: Diagnosis not present

## 2023-04-10 DIAGNOSIS — R0609 Other forms of dyspnea: Secondary | ICD-10-CM

## 2023-04-10 DIAGNOSIS — F439 Reaction to severe stress, unspecified: Secondary | ICD-10-CM | POA: Diagnosis not present

## 2023-04-10 DIAGNOSIS — E1165 Type 2 diabetes mellitus with hyperglycemia: Secondary | ICD-10-CM

## 2023-04-10 DIAGNOSIS — Z1231 Encounter for screening mammogram for malignant neoplasm of breast: Secondary | ICD-10-CM

## 2023-04-10 NOTE — Progress Notes (Signed)
 Subjective:    Patient ID: Lori Guzman, female    DOB: 1940-06-21, 83 y.o.   MRN: [id number]  Patient here for  Chief Complaint  Patient presents with   Medical Management of Chronic Issues    HPI Here for a scheduled follow up - follow up regarding hypertension, afib and diabetes. Saw pulmonary 04/02/23 - f/u DOE - recommended PFTs and HRCT as well as 6 minute walk. ECHO 01/2023 - elevated PAP. Planning for scan 04/30/23. Overall feels her breathing is stable. States she feels better now than she has in a while. No ches tpain reported. Handling stress.    Past Medical History:  Diagnosis Date   Allergy    reoccuring problems   GERD (gastroesophageal reflux disease)    History of colon polyps    adenomatous   Hyperlipidemia    Hypertension    Past Surgical History:  Procedure Laterality Date   BARTHOLIN GLAND CYST EXCISION     CARDIOVERSION N/A 02/26/2023   Procedure: CARDIOVERSION;  Surgeon: Antonieta Iba, MD;  Location: [facility] ORS;  Service: Cardiovascular;  Laterality: N/A;   CHOLECYSTECTOMY     NASAL POLYP SURGERY  1964   TONSILECTOMY/ADENOIDECTOMY WITH MYRINGOTOMY  1950   Family History  Problem Relation Age of Onset   Heart disease Mother    CVA Mother    Hypertension Mother    Alzheimer's disease Mother    Esophageal cancer Father    Breast cancer Neg Hx    Colon cancer Neg Hx    Social History   Socioeconomic History   Marital status: Widowed    Spouse name: Not on file   Number of children: Not on file   Years of education: Not on file   Highest education level: Associate degree: academic program  Occupational History   Not on file  Tobacco Use   Smoking status: Never    Passive exposure: Past   Smokeless tobacco: Never  Vaping Use   Vaping status: Never Used  Substance and Sexual Activity   Alcohol use: No    Alcohol/week: 0.0 standard drinks of alcohol   Drug use: No   Sexual activity: Not on file  Other Topics Concern   Not on file   Social History Narrative   She is married, has 3 children and is retired.   Social Drivers of Corporate investment banker Strain: Low Risk  (01/24/2023)   Overall Financial Resource Strain (CARDIA)    Difficulty of Paying Living Expenses: Not very hard  Food Insecurity: No Food Insecurity (01/24/2023)   Hunger Vital Sign    Worried About Running Out of Food in the Last Year: Never true    Ran Out of Food in the Last Year: Never true  Transportation Needs: No Transportation Needs (01/24/2023)   PRAPARE - Administrator, Civil Service (Medical): No    Lack of Transportation (Non-Medical): No  Physical Activity: Insufficiently Active (01/24/2023)   Exercise Vital Sign    Days of Exercise per Week: 3 days    Minutes of Exercise per Session: 10 min  Stress: No Stress Concern Present (01/24/2023)   [employer] of Occupational Health - Occupational Stress Questionnaire    Feeling of Stress : Not at all  Social Connections: Moderately Isolated (01/24/2023)   Social Connection and Isolation Panel [NHANES]    Frequency of Communication with Friends and Family: More than three times a week    Frequency of Social Gatherings with Friends and  Family: More than three times a week    Attends Religious Services: More than 4 times per year    Active Member of Clubs or Organizations: No    Attends Banker Meetings: Not on file    Marital Status: Widowed     Review of Systems  Constitutional:  Negative for appetite change and unexpected weight change.  HENT:  Negative for congestion and sinus pressure.   Respiratory:  Negative for cough and chest tightness.        Breathing stable.   Cardiovascular:  Negative for chest pain, palpitations and leg swelling.  Gastrointestinal:  Negative for abdominal pain, diarrhea, nausea and vomiting.  Genitourinary:  Negative for difficulty urinating and dysuria.  Musculoskeletal:  Negative for joint swelling and myalgias.   Skin:  Negative for color change and rash.  Neurological:  Negative for dizziness and headaches.  Psychiatric/Behavioral:  Negative for agitation and dysphoric mood.        Objective:     BP 122/76   Pulse 67   Temp 98 F (36.7 C)   Resp 16   Ht 5\' 4"  (1.626 m)   Wt 183 lb 12.8 oz (83.4 kg)   LMP 02/29/1992   SpO2 98%   BMI 31.55 kg/m  Wt Readings from Last 3 Encounters:  04/10/23 183 lb 12.8 oz (83.4 kg)  04/02/23 184 lb (83.5 kg)  03/22/23 187 lb 3.2 oz (84.9 kg)    Physical Exam Vitals reviewed.  Constitutional:      General: She is not in acute distress.    Appearance: Normal appearance.  HENT:     Head: Normocephalic and atraumatic.     Right Ear: External ear normal.     Left Ear: External ear normal.     Mouth/Throat:     Pharynx: No oropharyngeal exudate or posterior oropharyngeal erythema.  Eyes:     General: No scleral icterus.       Right eye: No discharge.        Left eye: No discharge.     Conjunctiva/sclera: Conjunctivae normal.  Neck:     Thyroid: No thyromegaly.  Cardiovascular:     Rate and Rhythm: Normal rate and regular rhythm.  Pulmonary:     Effort: No respiratory distress.     Breath sounds: Normal breath sounds. No wheezing.  Abdominal:     General: Bowel sounds are normal.     Palpations: Abdomen is soft.     Tenderness: There is no abdominal tenderness.  Musculoskeletal:        General: No swelling or tenderness.     Cervical back: Neck supple. No tenderness.  Lymphadenopathy:     Cervical: No cervical adenopathy.  Skin:    Findings: No erythema or rash.  Neurological:     Mental Status: She is alert.  Psychiatric:        Mood and Affect: Mood normal.        Behavior: Behavior normal.         Outpatient Encounter Medications as of 04/10/2023  Medication Sig   ELIQUIS 5 MG TABS tablet TAKE 1 TABLET BY MOUTH TWICE A DAY   escitalopram (LEXAPRO) 20 MG tablet TAKE 1 TABLET BY MOUTH EVERY DAY   fluticasone (FLONASE) 50  MCG/ACT nasal spray Place 1 spray into both nostrils daily as needed (Congestion).   furosemide (LASIX) 20 MG tablet Take 1 tablet (20 mg total) by mouth daily as needed (weight gain of greater than: 3 pounds in  one day or 5 pounds in one week). (Patient not taking: Reported on 04/02/2023)   metoprolol succinate (TOPROL-XL) 25 MG 24 hr tablet Take 1 tablet (25 mg total) by mouth 2 (two) times daily.   Multiple Vitamins-Minerals (ICAPS AREDS FORMULA PO) Take 1 tablet by mouth daily.   Polyethyl Glycol-Propyl Glycol (SYSTANE) 0.4-0.3 % SOLN Place 1 drop into both eyes daily as needed (Dry eye).   pravastatin (PRAVACHOL) 10 MG tablet TAKE ONE TABLET EVERY MONDAY, WEDNESDAY AND FRIDAY   Probiotic Product (PROBIOTIC PO) Take 1 capsule by mouth daily.   telmisartan (MICARDIS) 80 MG tablet Take 1 tablet (80 mg total) by mouth daily.   [DISCONTINUED] potassium chloride (KLOR-CON) 10 MEQ tablet Take 1 tablet (10 mEq total) by mouth daily. Take 1 tablet (10 mEq) with lasix. (Patient not taking: Reported on 04/02/2023)   No facility-administered encounter medications on file as of 04/10/2023.     Lab Results  Component Value Date   WBC 5.5 04/08/2023   HGB 12.7 04/08/2023   HCT 38.1 04/08/2023   PLT 220.0 04/08/2023   GLUCOSE 113 (H) 04/08/2023   CHOL 135 04/08/2023   TRIG 46.0 04/08/2023   HDL 52.80 04/08/2023   LDLCALC 73 04/08/2023   ALT 14 04/08/2023   AST 15 04/08/2023   NA 141 04/08/2023   K 4.3 04/08/2023   CL 104 04/08/2023   CREATININE 0.66 04/08/2023   BUN 18 04/08/2023   CO2 29 04/08/2023   TSH 2.39 04/08/2023   HGBA1C 6.7 (H) 04/08/2023   MICROALBUR 2.0 (H) 07/24/2022    DG Chest 2 View Result Date: 03/22/2023 CLINICAL DATA:  wheezing. fatigue EXAM: CHEST - 2 VIEW COMPARISON:  Chest radiograph 07/19/2022 FINDINGS: No pleural effusion. No pneumothorax. Unchanged cardiac and mediastinal contours. There are hazy bibasilar airspace opacities that could represent atelectasis or  infection. No radiographically apparent displaced rib fracture. Visualized upper abdomen is unremarkable. Vertebral body heights are maintained. Surgical clips in the right upper quadrant. IMPRESSION: Hazy bibasilar airspace opacities could represent atelectasis or infection. Electronically Signed   By: Lorenza Cambridge M.D.   On: 03/22/2023 08:53       Assessment & Plan:  Stress Assessment & Plan: On Lexapro.  Appears to be doing well.  Continue current medication regimen.  No changes.   Primary hypertension Assessment & Plan: Continue Micardis.  Also on metoprolol.  Blood pressure appears to be doing well.  No changes.  Followed pressures.  Follow metabolic panel.  Orders: -     Basic metabolic panel; Future  Hypercholesterolemia Assessment & Plan: Continue pravastatin.  Low-cholesterol diet and exercise.  Follow lipid panel and liver function testing.  Orders: -     Lipid panel; Future -     Hepatic function panel; Future  Type 2 diabetes mellitus with hyperglycemia, without long-term current use of insulin (HCC) Assessment & Plan: Low-carb diet and exercise.  On no medication.  Follow Mcvey and A1c. Lab Results  Component Value Date   HGBA1C 6.7 (H) 04/08/2023    Orders: -     Hemoglobin A1c; Future  Visit for screening mammogram -     3D Screening Mammogram, Left and Right; Future  Hyperbilirubinemia Assessment & Plan: Has varied intermittently through the years.  Bilirubin has been overall stable.  Previous abdominal ultrasound okay.  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  Follow liver panel.   DOE (dyspnea on exertion) Assessment & Plan: Saw pulmonary 04/02/2023 for follow-up of dyspnea on exertion.  Recommended pulmonary function testing and high-resolution  CT as well as 6-minute walk.  Echo December 2024 revealed elevated pulmonary artery pressure.  Planning for high-resolution CT 04/30/2023.  Overall she feels she is doing well.   Persistent atrial fibrillation [facility]) Assessment &  Plan: Status post cardioversion 02/26/2023.  Echocardiogram as outlined.  Continue Eliquis and metoprolol.  Appears to be in sinus rhythm.      Dale [place], MD

## 2023-04-12 ENCOUNTER — Telehealth: Payer: Self-pay | Admitting: Cardiovascular Disease

## 2023-04-12 ENCOUNTER — Telehealth: Payer: Self-pay | Admitting: Internal Medicine

## 2023-04-12 NOTE — Telephone Encounter (Signed)
 Spoke to pt. She was not happy with here last visit  and wanted to see Dr. Mariah Milling. Pt is going to be seen with Dr. Lorin Picket in [place]

## 2023-04-12 NOTE — Telephone Encounter (Signed)
 Copied from CRM [id number]. Topic: Referral - Request for Referral >> Apr 12, 2023 10:03 AM Alvino Blood C wrote: Did the patient discuss referral with their provider in the last year? Yes (If No - schedule appointment) (If Yes - send message)  Appointment offered? No  Type of order/referral and detailed reason for visit: Heart doctor  Preference of office, provider, location: Dr.Cooper/ [facility]  If referral order, have you been seen by this specialty before? Yes (If Yes, this issue or another issue? When? Where?  Can we respond through MyChart? Yes

## 2023-04-12 NOTE — Telephone Encounter (Signed)
 New Message:     Patient called and said she wanted to cancel her appointment for 04-16-23 with Dr Mariah Milling. She wanted me to be sure to tell Dr Mariah Milling how disappointed she was with him. She said she was supposed to have seen him 3 weeks after her hospital visit and it was 7 weeks. She said one time she had to see one of assistant,,and not Dr Mariah Milling.  She said she was very unhappy with that visit. Patient said she no longer wanted to see Dr Mariah Milling and was finding another doctor in [place] to see her.

## 2023-04-14 ENCOUNTER — Encounter: Payer: Self-pay | Admitting: Internal Medicine

## 2023-04-14 NOTE — Assessment & Plan Note (Signed)
 Status post cardioversion 02/26/2023.  Echocardiogram as outlined.  Continue Eliquis and metoprolol.  Appears to be in sinus rhythm.

## 2023-04-14 NOTE — Assessment & Plan Note (Signed)
 Continue pravastatin.  Low-cholesterol diet and exercise.  Follow lipid panel and liver function testing.

## 2023-04-14 NOTE — Assessment & Plan Note (Signed)
 Continue Micardis.  Also on metoprolol.  Blood pressure appears to be doing well.  No changes.  Followed pressures.  Follow metabolic panel.

## 2023-04-14 NOTE — Assessment & Plan Note (Signed)
 Saw pulmonary 04/02/2023 for follow-up of dyspnea on exertion.  Recommended pulmonary function testing and high-resolution CT as well as 6-minute walk.  Echo December 2024 revealed elevated pulmonary artery pressure.  Planning for high-resolution CT 04/30/2023.  Overall she feels she is doing well.

## 2023-04-14 NOTE — Assessment & Plan Note (Signed)
 On Lexapro.  Appears to be doing well.  Continue current medication regimen.  No changes.

## 2023-04-14 NOTE — Assessment & Plan Note (Signed)
 Has varied intermittently through the years.  Bilirubin has been overall stable.  Previous abdominal ultrasound okay.  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  Follow liver panel.

## 2023-04-14 NOTE — Assessment & Plan Note (Signed)
 Low-carb diet and exercise.  On no medication.  Follow Mcvey and A1c. Lab Results  Component Value Date   HGBA1C 6.7 (H) 04/08/2023

## 2023-04-15 ENCOUNTER — Encounter: Payer: Self-pay | Admitting: Internal Medicine

## 2023-04-15 NOTE — Telephone Encounter (Signed)
 Copied from CRM [id number]. Topic: General - Other >> Apr 15, 2023 10:26 AM Elizebeth Brooking wrote: Reason for CRM: Patient called in wanting to speak with Bethann Berkshire is requesting a callback [phone]

## 2023-04-15 NOTE — Telephone Encounter (Signed)
 Patient is wanting to switch to Dr Excell Seltzer. Advised she should not need a new referral. Number given to patient. She will let me know if anything further is needed from our office.

## 2023-04-16 ENCOUNTER — Encounter: Payer: Self-pay | Admitting: Internal Medicine

## 2023-04-16 ENCOUNTER — Ambulatory Visit: Payer: Medicare PPO | Admitting: Cardiovascular Disease

## 2023-04-16 NOTE — Telephone Encounter (Signed)
 Called patient to discuss. Lori Guzman is going to call back over to Dr Ethelene Hal office to have her appointment rescheduled. Lori Guzman says Lori Guzman will let us know if Lori Guzman needs anything.

## 2023-04-16 NOTE — Telephone Encounter (Signed)
 See my chart message

## 2023-04-29 ENCOUNTER — Other Ambulatory Visit: Payer: Self-pay | Admitting: Internal Medicine

## 2023-04-29 ENCOUNTER — Ambulatory Visit
Admission: RE | Admit: 2023-04-29 | Discharge: 2023-04-29 | Disposition: A | Discharge status: Home / Self Care | Payer: Medicare PPO | Source: Ambulatory Visit | Attending: Internal Medicine | Admitting: Internal Medicine

## 2023-04-29 DIAGNOSIS — R0609 Other forms of dyspnea: Secondary | ICD-10-CM | POA: Diagnosis not present

## 2023-04-29 DIAGNOSIS — Z1231 Encounter for screening mammogram for malignant neoplasm of breast: Secondary | ICD-10-CM | POA: Insufficient documentation

## 2023-04-29 DIAGNOSIS — J8410 Pulmonary fibrosis, unspecified: Secondary | ICD-10-CM | POA: Diagnosis not present

## 2023-04-30 ENCOUNTER — Ambulatory Visit
Admission: RE | Admit: 2023-04-30 | Discharge: 2023-04-30 | Disposition: A | Discharge status: Home / Self Care | Payer: Medicare PPO | Source: Ambulatory Visit | Attending: Internal Medicine | Admitting: Internal Medicine

## 2023-04-30 DIAGNOSIS — R0609 Other forms of dyspnea: Secondary | ICD-10-CM | POA: Diagnosis not present

## 2023-04-30 DIAGNOSIS — R918 Other nonspecific abnormal finding of lung field: Secondary | ICD-10-CM | POA: Diagnosis not present

## 2023-04-30 DIAGNOSIS — J8410 Pulmonary fibrosis, unspecified: Secondary | ICD-10-CM | POA: Diagnosis not present

## 2023-04-30 DIAGNOSIS — Z1231 Encounter for screening mammogram for malignant neoplasm of breast: Secondary | ICD-10-CM | POA: Diagnosis not present

## 2023-04-30 NOTE — Telephone Encounter (Signed)
 Please refuse. Patient is not taking.

## 2023-05-06 ENCOUNTER — Encounter: Payer: Self-pay | Admitting: Internal Medicine

## 2023-05-06 ENCOUNTER — Ambulatory Visit: Admitting: Internal Medicine

## 2023-05-06 VITALS — BP 124/78 | HR 66 | Temp 97.8°F | Ht 64.0 in | Wt 179.8 lb

## 2023-05-06 DIAGNOSIS — I10 Essential (primary) hypertension: Secondary | ICD-10-CM | POA: Diagnosis not present

## 2023-05-06 DIAGNOSIS — N390 Urinary tract infection, site not specified: Secondary | ICD-10-CM | POA: Insufficient documentation

## 2023-05-06 DIAGNOSIS — R300 Dysuria: Secondary | ICD-10-CM

## 2023-05-06 DIAGNOSIS — R350 Frequency of micturition: Secondary | ICD-10-CM

## 2023-05-06 DIAGNOSIS — N3000 Acute cystitis without hematuria: Secondary | ICD-10-CM | POA: Diagnosis not present

## 2023-05-06 LAB — POC URINALSYSI DIPSTICK (AUTOMATED)
Bilirubin, UA: POSITIVE
Glucose, UA: POSITIVE — AB
Ketones, UA: POSITIVE
Nitrite, UA: POSITIVE
Protein, UA: POSITIVE — AB
Spec Grav, UA: 1.02 (ref 1.010–1.025)
Urobilinogen, UA: 2 U/dL — AB
pH, UA: 5 (ref 5.0–8.0)

## 2023-05-06 MED ORDER — CEPHALEXIN 500 MG PO CAPS: 500.0000 mg | ORAL_CAPSULE | Freq: Two times a day (BID) | ORAL | 0 refills | Status: DC

## 2023-05-06 NOTE — Telephone Encounter (Signed)
 Scheduled Patient to see Dr. Heide Spark at 10:00 today.

## 2023-05-06 NOTE — Progress Notes (Signed)
 Acute Office Visit  Subjective:     Patient ID: Lori Guzman, female    DOB: 08-22-1940, 83 y.o.   MRN: [id number]  Chief Complaint  Patient presents with   Acute Visit    Urine frequency & burning since Saturday  Took AZO    HPI Patient is in today for increased urinary frequency and dysuria over the last 2 days.  Patient states that on Saturday (March 22) she developed increased urinary frequency, urgency as well as significant dysuria.  Patient states that the symptoms improved slightly with the Azo but yesterday night the symptoms significantly worsened.  She denies any hematuria.  No fevers chills.  No back pain.  No nausea or vomiting.  Review of Systems  Constitutional: Negative.  Negative for chills, fever and malaise/fatigue.  Respiratory: Negative.    Cardiovascular: Negative.   Gastrointestinal:  Negative for abdominal pain, nausea and vomiting.  Genitourinary:  Positive for dysuria, frequency and urgency. Negative for flank pain and hematuria.  Musculoskeletal: Negative.  Negative for back pain.        Objective:    BP 124/78   Pulse 66   Temp 97.8 F (36.6 C)   Ht 5\' 4"  (1.626 m)   Wt 179 lb 12.8 oz (81.6 kg)   LMP 02/29/1992   SpO2 98%   BMI 30.86 kg/m    Physical Exam Constitutional:      Appearance: Normal appearance.  HENT:     Head: Normocephalic and atraumatic.  Cardiovascular:     Rate and Rhythm: Normal rate and regular rhythm.     Heart sounds: Normal heart sounds.  Pulmonary:     Breath sounds: Normal breath sounds. No wheezing or rales.  Abdominal:     Tenderness: There is no right CVA tenderness or left CVA tenderness.  Neurological:     Mental Status: She is alert.  Psychiatric:        Mood and Affect: Mood normal.        Behavior: Behavior normal.     Results for orders placed or performed in visit on 05/06/23  POCT Urinalysis Dipstick (Automated)  Result Value Ref Range   Color, UA orange    Clarity, UA clear     Glucose, UA Positive (A) Negative   Bilirubin, UA pos    Ketones, UA pos    Spec Grav, UA 1.020 1.010 - 1.025   Blood, UA large    pH, UA 5.0 5.0 - 8.0   Protein, UA Positive (A) Negative   Urobilinogen, UA 2.0 (A) 0.2 or 1.0 E.U./dL   Nitrite, UA pos    Leukocytes, UA Large (3+) (A) Negative        Assessment & Plan:   Problem List Items Addressed This Visit       Cardiovascular and Mediastinum   Hypertension   -This problem is chronic and stable -Patient blood pressure is at goal (today was 124/78) -Will continue current medications including telmisartan 80 mg daily as well as metoprolol 25 mg twice daily -No further workup at this time        Genitourinary   UTI (urinary tract infection)   -Patient presented today with worsening dysuria, frequency and urgency over the last 2 days which improved only slightly with Azo -No systemic symptoms noted (no fevers or chills, no back pain, no nausea or vomiting) -On exam, patient has no CVA tenderness -Patient likely have a urinary tract infection -Urine dipstick done here showed positive blood and  leukocytes as well as nitrite positive -Patient did have a urinary tract infection last year in March and was treated with Keflex.  Her urine culture was sensitive to cefazolin last time -We will prescribe Keflex 500 mg twice daily to complete a 5-day course -Will follow-up urine culture -No further workup at this time      Relevant Medications   cephALEXin (KEFLEX) 500 MG capsule   Other Visit Diagnoses       Urine frequency    -  Primary   Relevant Orders   POCT Urinalysis Dipstick (Automated) (Completed)   Urine Culture     Burning with urination       Relevant Orders   POCT Urinalysis Dipstick (Automated) (Completed)   Urine Culture       Meds ordered this encounter  Medications   cephALEXin (KEFLEX) 500 MG capsule    Sig: Take 1 capsule (500 mg total) by mouth 2 (two) times daily.    Dispense:  10 capsule     Refill:  0    No follow-ups on file.  Earl Lagos, MD

## 2023-05-06 NOTE — Patient Instructions (Addendum)
-  It was a pleasure meeting you today -You have a urinary tract infection.  We will treat you with an antibiotic (Keflex) to complete a 5-day course -Please contact us if you have worsening  symptoms including fevers or chills, severe back pain, nausea or vomiting or if your symptoms are not improving on your antibiotic -We will send your urine for a culture to determine which bacteria is growing in the urine and make sure it is sensitive to the antibiotic we have you on

## 2023-05-06 NOTE — Assessment & Plan Note (Addendum)
-  This problem is chronic and stable -Patient blood pressure is at goal (today was 124/78) -Will continue current medications including telmisartan 80 mg daily as well as metoprolol 25 mg twice daily -No further workup at this time

## 2023-05-06 NOTE — Assessment & Plan Note (Signed)
-  Patient presented today with worsening dysuria, frequency and urgency over the last 2 days which improved only slightly with Azo -No systemic symptoms noted (no fevers or chills, no back pain, no nausea or vomiting) -On exam, patient has no CVA tenderness -Patient likely have a urinary tract infection -Urine dipstick done here showed positive blood and leukocytes as well as nitrite positive -Patient did have a urinary tract infection last year in March and was treated with Keflex.  Her urine culture was sensitive to cefazolin last time -We will prescribe Keflex 500 mg twice daily to complete a 5-day course -Will follow-up urine culture -No further workup at this time

## 2023-05-08 LAB — URINE CULTURE
MICRO NUMBER:: 16238785
SPECIMEN QUALITY:: ADEQUATE

## 2023-05-10 ENCOUNTER — Telehealth: Payer: Self-pay | Admitting: Internal Medicine

## 2023-05-10 NOTE — Telephone Encounter (Signed)
 I called the patient to follow-up with her.  We did obtain a urine culture on her which showed E. coli but the susceptibility to cefazolin was not reported.  Patient states that her symptoms have completely resolved and she feels back to normal.  She still has 1 pill of the antibiotic left that she is going to take this evening.  Given that her symptoms are resolved no further workup is required at this time.  Patient will follow-up as needed

## 2023-05-24 ENCOUNTER — Ambulatory Visit: Payer: Medicare PPO | Admitting: Pulmonary Disease

## 2023-05-24 ENCOUNTER — Encounter: Payer: Self-pay | Admitting: Pulmonary Disease

## 2023-05-24 VITALS — BP 160/70 | HR 57 | Temp 97.8°F | Ht 64.0 in | Wt 181.0 lb

## 2023-05-24 DIAGNOSIS — I480 Paroxysmal atrial fibrillation: Secondary | ICD-10-CM | POA: Diagnosis not present

## 2023-05-24 DIAGNOSIS — R0609 Other forms of dyspnea: Secondary | ICD-10-CM | POA: Diagnosis not present

## 2023-05-24 DIAGNOSIS — J8410 Pulmonary fibrosis, unspecified: Secondary | ICD-10-CM | POA: Diagnosis not present

## 2023-05-24 DIAGNOSIS — I272 Pulmonary hypertension, unspecified: Secondary | ICD-10-CM

## 2023-05-24 NOTE — Patient Instructions (Addendum)
 VISIT SUMMARY:  Today, we discussed your shortness of breath, atrial fibrillation, and allergies. Your symptoms have improved since your last visit, and your recent CT scan shows only mild scarring. We also talked about your recent fall and how your atrial fibrillation might be contributing to your symptoms.  YOUR PLAN:  -SHORTNESS OF BREATH: Your shortness of breath has improved and is likely related to a previous viral infection and your atrial fibrillation. A recent CT scan shows mild scarring but no progression. We will order a pulmonary function test and schedule a six-minute walk test to further evaluate your condition.  -ATRIAL FIBRILLATION: Atrial fibrillation is an irregular and often rapid heart rate that can lead to poor blood flow. It is contributing to your shortness of breath and may have caused your recent fall. We discussed how it affects your heart's ability to pump blood effectively.  You have resolve this issue after cardioversion by Dr. Mariah Milling.  Continue taking your medications as directed by Dr. Mariah Milling.  -ALLERGIC RHINITIS: Allergic rhinitis is an allergic reaction that causes sneezing, congestion, and a runny nose. You are currently using a nasal spray, which provides some relief. We recommend trying loratadine for a less sedating option or cetirizine at bedtime if needed.  INSTRUCTIONS:  Please schedule a follow-up appointment in three months to assess your progress and the results of your tests.

## 2023-05-24 NOTE — Progress Notes (Signed)
 Subjective:    Patient ID: Lori Guzman, female    DOB: [birth year], 83 y.o.   MRN: [id number]  Patient Care Team: Dellar Fenton, MD as PCP - General (Internal Medicine)  Chief Complaint  Patient presents with   Follow-up    No breathing problems.     BACKGROUND/INTERVAL: Patient presents for evaluation of dyspnea and abnormal CT chest suggestive of interstitial lung disease.  She was previously evaluated by Dr. 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  Patient transferred care to our practice in [place] due to transportation issues.  She was initially seen by me on 02 April 2023.  HPI Discussed the use of AI scribe software for clinical note transcription with the patient, who gave verbal consent to proceed.  History of Present Illness   Lori Guzman is an 83 year old female with atrial fibrillation who presents with shortness of breath and abnormal CT findings.   She experiences shortness of breath, which she attributes to her heart not beating correctly due to atrial fibrillation. She feels that when her heart is not pumping effectively it worsens her symptoms. She recalls a recent fall while walking to the mailbox, resulting in a head injury and broken glasses, which occurred after receiving cardioversion.  She has a history of atrial fibrillation, which she believes contributes to her shortness of breath. She underwent cardioversion, which has led to some improvement in her symptoms.  Her CT scan from June showed significant issues, but a recent scan indicates improvement with only mild scarring remaining. She attributes her improved feeling to this change, noting that she felt much better after the scan results.  She has a history of allergies, which she manages by using a nasal spray every night. She used to take allergy medications regularly but currently does not, although she has  Zyrtec available and has used it in the past. No specific allergy medications are used regularly. Her blood pressure was elevated today, which she attributes to 'white coat syndrome.'      Review of Systems A 10 point review of systems was performed and it is as noted above otherwise negative.   Patient Active Problem List   Diagnosis Date Noted   UTI (urinary tract infection) 05/06/2023   DOE (dyspnea on exertion) 04/02/2023   Wheezing 03/24/2023   SOB (shortness of breath) 02/26/2023   Atrial fibrillation (HCC) 11/24/2022   Irregular heart beat 11/23/2022   Hyperbilirubinemia 11/23/2022   Post-nasal drainage 07/29/2022   Abnormal CXR 07/24/2022   Acute cough 07/19/2022   Pneumonia 01/14/2022   Chronic bronchitis (HCC) 12/29/2021   Itching 11/05/2021   Otitis media 10/13/2021   Rash 03/19/2021   Muscle ache 03/16/2021   Right knee pain 09/27/2020   Sinus congestion 08/15/2020   Thumb pain, left 03/27/2020   Lower abdominal pain 03/03/2017   Dizziness 02/03/2016   Neck pain 01/15/2016   Fatigue 08/22/2015   Near syncope 08/22/2015   Light headedness 08/10/2014   BMI 31.0-31.9,adult 04/05/2014   Health care maintenance 04/05/2014   Back pain 12/06/2013   Hypercholesterolemia 12/06/2013   Gum lesion 07/19/2013   Stress 01/21/2013   Diabetes (HCC) 08/15/2012   GERD (gastroesophageal reflux disease) 06/09/2012   History of colonic polyps 06/09/2012   Hypertension 12/17/2011    Social History   Tobacco Use   Smoking status: Never    Passive exposure: Past   Smokeless tobacco: Never  Substance  Use Topics   Alcohol use: No    Alcohol/week: 0.0 standard drinks of alcohol    Allergies  Allergen Reactions   Other Itching and Dermatitis    Seasonal allergies    Current Meds  Medication Sig   escitalopram (LEXAPRO) 20 MG tablet TAKE 1 TABLET BY MOUTH EVERY DAY   fluticasone (FLONASE) 50 MCG/ACT nasal spray Place 1 spray into both nostrils daily as needed  (Congestion).   metoprolol succinate (TOPROL-XL) 25 MG 24 hr tablet Take 1 tablet (25 mg total) by mouth 2 (two) times daily.   Multiple Vitamins-Minerals (ICAPS AREDS FORMULA PO) Take 1 tablet by mouth daily.   Polyethyl Glycol-Propyl Glycol (SYSTANE) 0.4-0.3 % SOLN Place 1 drop into both eyes daily as needed (Dry eye).   pravastatin (PRAVACHOL) 10 MG tablet TAKE ONE TABLET EVERY MONDAY, WEDNESDAY AND FRIDAY   Probiotic Product (PROBIOTIC PO) Take 1 capsule by mouth daily.   telmisartan (MICARDIS) 80 MG tablet Take 1 tablet (80 mg total) by mouth daily.   [DISCONTINUED] ELIQUIS 5 MG TABS tablet TAKE 1 TABLET BY MOUTH TWICE A DAY    Immunization History  Administered Date(s) Administered   Fluad Quad(high Dose 65+) 01/13/2020, 10/30/2021   Influenza Split 02/29/2012   Influenza, High Dose Seasonal PF 12/05/2016, 12/29/2018   Influenza,inj,Quad PF,6+ Mos 01/19/2013, 11/30/2013, 12/09/2014, 11/08/2017   Influenza-Unspecified 11/15/2015, 11/12/2020, 11/23/2022   PFIZER(Purple Top)SARS-COV-2 Vaccination 04/01/2019, 04/27/2019   Pneumococcal Conjugate-13 08/09/2014   Pneumococcal Polysaccharide-23 06/09/2012        Objective:     BP (!) 160/70 (BP Location: Left Arm, Patient Position: Sitting, Cuff Size: Normal)   Pulse (!) 57   Temp 97.8 F (36.6 C) (Temporal)   Ht 5\' 4"  (1.626 m)   Wt 181 lb (82.1 kg)   LMP 02/29/1992   SpO2 96%   BMI 31.07 kg/m   SpO2: 96 %  GENERAL: Overweight woman, no acute distress, fully ambulatory, no conversational dyspnea. HEAD: Normocephalic, atraumatic.  EYES: Pupils equal, round, reactive to light.  No scleral icterus.  MOUTH: Dentition intact, oral mucosa moist.  No thrush. NECK: Supple. No thyromegaly. Trachea midline. No JVD.  No adenopathy. PULMONARY: Good air entry bilaterally.  Coarse, otherwise, no adventitious sounds. CARDIOVASCULAR: S1 and S2. Regular rate and rhythm.  Grade 2/6 mitral regurgitation murmur. ABDOMEN: Obese, otherwise  benign. MUSCULOSKELETAL: No joint deformity, no clubbing, no edema.  NEUROLOGIC: No overt focal deficit, no gait disturbance, speech is fluent. SKIN: Intact,warm,dry. PSYCH: Mood and behavior normal.  High resolution CT chest from 30 April 2023 was independently reviewed and discussed with the patient.  The findings are improved from her prior June CT, the changes are likely postinfectious/postinflammatory fibrosis.  Chest CT also shown to the patient.      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 Dyspnea on exertion  R06.09 Pulmonary function test    3. Pulmonary HTN (HCC)  I27.20     4. Paroxysmal atrial fibrillation (HCC)  I48.0       Orders Placed This Encounter  Procedures   Pulmonary function test    Standing Status:   Future    Expected Date:   06/07/2023    Expiration Date:   05/23/2024    Where should this test be performed?:   Outpatient Pulmonary    What type of PFT is being ordered?:   Full PFT    MIP/MEP:  No   Discussion:    Shortness of breath Symptoms have improved since the last visit, likely related to resolution of prior viral infection and cardioversion for atrial fibrillation. CT scan shows mild scarring without progression. Symptoms are exacerbated by atrial fibrillation affecting cardiac output. She reports feeling better, correlating with improved CT findings. - Order pulmonary function test  Atrial fibrillation Contributing to shortness of breath due to ineffective cardiac output. She experienced a fall likely related to symptoms of atrial fibrillation. Discussed the impact of atrial fibrillation on cardiac output and subsequent shortness of breath.  Feels much better after cardioversion.  Allergic rhinitis Reports voice changes with weather changes, likely due to allergies. Currently using nasal spray with some relief. Discussed use of loratadine or cetirizine for  additional symptom control. Loratadine recommended for less sedating option, cetirizine advised at bedtime if used due to potential sedative effects. - Recommend loratadine for less sedating option - Advise taking cetirizine at bedtime if used  Follow-up Follow-up visit to assess progress and results of tests. - Schedule follow-up appointment in three months      Advised if symptoms do not improve or worsen, to please contact office for sooner follow up or seek emergency care.    I spent 32 minutes of dedicated to the care of this patient on the date of this encounter to include pre-visit review of records, face-to-face time with the patient discussing conditions above, post visit ordering of testing, clinical documentation with the electronic health record, making appropriate referrals as documented, and communicating necessary findings to members of the patients care team.     C. Chloe Counter, MD Advanced Bronchoscopy PCCM [facility]    *This note was generated using voice recognition software/Dragon and/or AI transcription program.  Despite best efforts to proofread, errors can occur which can change the meaning. Any transcriptional errors that result from this process are unintentional and may not be fully corrected at the time of dictation.

## 2023-05-27 ENCOUNTER — Other Ambulatory Visit: Payer: Self-pay | Admitting: Internal Medicine

## 2023-05-27 ENCOUNTER — Encounter: Payer: Self-pay | Admitting: Pulmonary Disease

## 2023-05-30 ENCOUNTER — Encounter

## 2023-05-30 DIAGNOSIS — H353132 Nonexudative age-related macular degeneration, bilateral, intermediate dry stage: Secondary | ICD-10-CM | POA: Diagnosis not present

## 2023-06-03 ENCOUNTER — Ambulatory Visit: Admitting: Pulmonary Disease

## 2023-06-03 DIAGNOSIS — R0609 Other forms of dyspnea: Secondary | ICD-10-CM | POA: Diagnosis not present

## 2023-06-03 DIAGNOSIS — J8410 Pulmonary fibrosis, unspecified: Secondary | ICD-10-CM

## 2023-06-03 LAB — PULMONARY FUNCTION TEST
DL/VA % pred: 83 %
DL/VA: 3.44 ml/min/mmHg/L
DLCO unc % pred: 82 %
DLCO unc: 15.18 ml/min/mmHg
FEF 25-75 Post: 2.18 L/s
FEF 25-75 Pre: 1.42 L/s
FEF2575-%Change-Post: 53 %
FEF2575-%Pred-Post: 160 %
FEF2575-%Pred-Pre: 104 %
FEV1-%Change-Post: 14 %
FEV1-%Pred-Post: 110 %
FEV1-%Pred-Pre: 96 %
FEV1-Post: 2.11 L
FEV1-Pre: 1.85 L
FEV1FVC-%Change-Post: 7 %
FEV1FVC-%Pred-Pre: 103 %
FEV6-%Change-Post: 6 %
FEV6-%Pred-Post: 105 %
FEV6-%Pred-Pre: 99 %
FEV6-Post: 2.57 L
FEV6-Pre: 2.4 L
FEV6FVC-%Change-Post: 0 %
FEV6FVC-%Pred-Post: 105 %
FEV6FVC-%Pred-Pre: 105 %
FVC-%Change-Post: 6 %
FVC-%Pred-Post: 100 %
FVC-%Pred-Pre: 94 %
FVC-Post: 2.57 L
FVC-Pre: 2.42 L
Post FEV1/FVC ratio: 82 %
Post FEV6/FVC ratio: 100 %
Pre FEV1/FVC ratio: 76 %
Pre FEV6/FVC Ratio: 99 %
RV % pred: 165 %
RV: 3.98 L
TLC % pred: 128 %
TLC: 6.52 L

## 2023-06-03 NOTE — Progress Notes (Signed)
 Full PFT performed today.

## 2023-06-03 NOTE — Patient Instructions (Signed)
 Full PFT performed today.

## 2023-06-13 ENCOUNTER — Other Ambulatory Visit: Payer: Self-pay | Admitting: Internal Medicine

## 2023-06-15 NOTE — Progress Notes (Unsigned)
 Cardiology Office Note  Date:  06/17/2023   ID:  SETA MATSUBARA, DOB Sep 04, 1940, MRN [id number]  PCP:  Dellar Fenton, MD   Chief Complaint  Patient presents with   Follow up s/p Cardioversion     Patient took a fall after the Cardioversion in Jan. 2025; was evaluated due to hitting her head while being on Eliquis . Patient c/o a terrible headache x 2 days, patient feels her blood pressure has been elevated.     HPI:  Ms. Noora Barrickman is a 83 year old woman with past medical history of Diabetes Hypertension Hyperlipidemia Diabetes Atrial fibrillation Who presents for follow-up of her paroxysmal atrial fibrillation  Seen by myself in clinic 1/25 Underwent cardioversion February 26, 2023 Jaw hurt next day (perhaps from anesthesia opening airway)  Seen in the emergency room January 16 after getting dizzy, fall, facial trauma, bruising on her legs, no loss of consciousness "Felt like vertigo" Workup in the ER essentially negative Head CT/neck looked ok  Back to normal now, energy is slowly getting back but took several weeks to months Goes to the gym with a friend  Has a headache yesterday and today Normal BP 130 systolic This Am BP 160 systolic Elevated in office today, improved on recheck  EKG personally reviewed by myself on todays visit EKG Interpretation Date/Time:  Monday Jun 17 2023 14:23:31 EDT Ventricular Rate:  60 PR Interval:  194 QRS Duration:  76 QT Interval:  444 QTC Calculation: 444 R Axis:   38  Text Interpretation: Sinus rhythm with Premature atrial complexes When compared with ECG of 08-Mar-2023 11:27, Premature atrial complexes are now Present ST no longer depressed in Anterior leads Confirmed by Belva Boyden [id number]) on 06/17/2023 2:35:00 PM   Echocardiogram ejection fraction 60 to 65%,  Mild to moderately dilated left atrium  elevated right heart pressures, 41 mmHg   timing of onset of atrial fibrillation, possibly in the setting of  bronchitis in September or early October 2024  EKG November 23, 2022 showing atrial fibrillation rate 100 bpm Started on Eliquis  and metoprolol  succinate 25 mg daily  PMH:   has a past medical history of Allergy, GERD (gastroesophageal reflux disease), History of colon polyps, Hyperlipidemia, and Hypertension.  PSH:    Past Surgical History:  Procedure Laterality Date   BARTHOLIN GLAND CYST EXCISION     CARDIOVERSION N/A 02/26/2023   Procedure: CARDIOVERSION;  Surgeon: Devorah Fonder, MD;  Location: [facility] ORS;  Service: Cardiovascular;  Laterality: N/A;   CHOLECYSTECTOMY     NASAL POLYP SURGERY  1964   TONSILECTOMY/ADENOIDECTOMY WITH MYRINGOTOMY  1950    Current Outpatient Medications  Medication Sig Dispense Refill   ELIQUIS  5 MG TABS tablet TAKE 1 TABLET BY MOUTH TWICE A DAY 60 tablet 2   escitalopram  (LEXAPRO ) 20 MG tablet TAKE 1 TABLET BY MOUTH EVERY DAY 90 tablet 1   fluticasone (FLONASE) 50 MCG/ACT nasal spray Place 1 spray into both nostrils daily as needed (Congestion).     metoprolol  succinate (TOPROL -XL) 25 MG 24 hr tablet Take 1 tablet (25 mg total) by mouth 2 (two) times daily. 180 tablet 3   Multiple Vitamins-Minerals (ICAPS AREDS FORMULA PO) Take 1 tablet by mouth daily.     Polyethyl Glycol-Propyl Glycol (SYSTANE) 0.4-0.3 % SOLN Place 1 drop into both eyes daily as needed (Dry eye).     pravastatin  (PRAVACHOL ) 10 MG tablet TAKE ONE TABLET EVERY MONDAY, WEDNESDAY AND FRIDAY 39 tablet 1   Probiotic Product (PROBIOTIC PO) Take 1 capsule  by mouth daily.     telmisartan  (MICARDIS ) 80 MG tablet Take 1 tablet (80 mg total) by mouth daily. 90 tablet 3   furosemide  (LASIX ) 20 MG tablet Take 20 mg by mouth daily as needed for edema (weight gain of greater than 3 pounds in one day or 5 pounds in one week.). (Patient not taking: Reported on 06/17/2023)     potassium chloride  (KLOR-CON ) 10 MEQ tablet Take 10 mEq by mouth daily as needed (take when you take a Furosemide .). (Patient not  taking: Reported on 06/17/2023)     No current facility-administered medications for this visit.    Allergies:   Other   Social History:  The patient  reports that she has never smoked. She has been exposed to tobacco smoke. She has never used smokeless tobacco. She reports that she does not drink alcohol and does not use drugs.   Family History:   family history includes Alzheimer's disease in her mother; CVA in her mother; Esophageal cancer in her father; Heart disease in her mother; Hypertension in her mother.    Review of Systems: Review of Systems  Constitutional: Negative.   HENT: Negative.    Respiratory: Negative.    Cardiovascular: Negative.   Gastrointestinal: Negative.   Musculoskeletal: Negative.   Neurological: Negative.   Psychiatric/Behavioral: Negative.    All other systems reviewed and are negative.  PHYSICAL EXAM: VS:  BP (!) 200/80 (BP Location: Left Arm, Patient Position: Sitting, Cuff Size: Normal)   Pulse 60   Ht 5\' 4"  (1.626 m)   Wt 180 lb 4 oz (81.8 kg)   LMP 02/29/1992   SpO2 96%   BMI 30.94 kg/m  , BMI Body mass index is 30.94 kg/m. Constitutional:  oriented to person, place, and time. No distress.  HENT:  Head: Grossly normal Eyes:  no discharge. No scleral icterus.  Neck: No JVD, no carotid bruits  Cardiovascular: Regular rate and rhythm, no murmurs appreciated Pulmonary/Chest: Clear to auscultation bilaterally, no wheezes or rales Abdominal: Soft.  no distension.  no tenderness.  Musculoskeletal: Normal range of motion Neurological:  normal muscle tone. Coordination normal. No atrophy Skin: Skin warm and dry Psychiatric: normal affect, pleasant  Recent Labs: 04/08/2023: ALT 14; BUN 18; Creatinine, Ser 0.66; Hemoglobin 12.7; Platelets 220.0; Potassium 4.3; Sodium 141; TSH 2.39   Lipid Panel Lab Results  Component Value Date   CHOL 135 04/08/2023   HDL 52.80 04/08/2023   LDLCALC 73 04/08/2023   TRIG 46.0 04/08/2023    Wt Readings from  Last 3 Encounters:  06/17/23 180 lb 4 oz (81.8 kg)  06/03/23 181 lb 6.4 oz (82.3 kg)  05/24/23 181 lb (82.1 kg)     ASSESSMENT AND PLAN:  Problem List Items Addressed This Visit       Cardiology Problems   Hypertension   Relevant Medications   furosemide  (LASIX ) 20 MG tablet   Other Relevant Orders   EKG 12-Lead (Completed)   Atrial fibrillation (HCC) - Primary   Relevant Medications   furosemide  (LASIX ) 20 MG tablet   Other Relevant Orders   EKG 12-Lead (Completed)     Other   SOB (shortness of breath)   Relevant Orders   EKG 12-Lead (Completed)   Diabetes (HCC)   Other Visit Diagnoses       Leg edema         Mixed hyperlipidemia       Relevant Medications   furosemide  (LASIX ) 20 MG tablet  Persistent atrial fibrillation Symptoms end of September into October 2024 Atrial fibrillation documented November 23, 2022  Successful cardioversion February 26, 2023 -Compliant with Eliquis  5 twice daily Maintaining normal sinus rhythm, continue metoprolol  succinate 25 twice daily  Leg edema In the setting of atrial fibrillation Leg edema improved, takes Lasix  as needed for leg swelling  Essential hypertension Blood pressure elevated but in the setting of headache, typically well-controlled at home systolic 130 on average Recommend she continue to monitor blood pressure and call us  if numbers stay high  Diabetes type 2 A1c trending higher 6.8 continue exercise, careful diet management  Calorie restriction recommended  Fall 2 days after cardioversion felt that she had "Vertigo" Weakness while walking, went down and hit her head, leg bruising, but no loss of consciousness Workup in the ER negative Blood pressure stable, no further episodes   Signed, Juanda Noon, M.D., Ph.D. [facility] [place], [place] [phone]

## 2023-06-17 ENCOUNTER — Encounter: Payer: Self-pay | Admitting: Cardiovascular Disease

## 2023-06-17 ENCOUNTER — Ambulatory Visit: Attending: Cardiovascular Disease | Admitting: Cardiovascular Disease

## 2023-06-17 VITALS — BP 160/80 | HR 60 | Ht 64.0 in | Wt 180.2 lb

## 2023-06-17 DIAGNOSIS — I10 Essential (primary) hypertension: Secondary | ICD-10-CM | POA: Diagnosis not present

## 2023-06-17 DIAGNOSIS — R0602 Shortness of breath: Secondary | ICD-10-CM | POA: Diagnosis not present

## 2023-06-17 DIAGNOSIS — E1165 Type 2 diabetes mellitus with hyperglycemia: Secondary | ICD-10-CM | POA: Diagnosis not present

## 2023-06-17 DIAGNOSIS — E782 Mixed hyperlipidemia: Secondary | ICD-10-CM

## 2023-06-17 DIAGNOSIS — R600 Localized edema: Secondary | ICD-10-CM | POA: Diagnosis not present

## 2023-06-17 DIAGNOSIS — I480 Paroxysmal atrial fibrillation: Secondary | ICD-10-CM

## 2023-06-17 NOTE — Patient Instructions (Addendum)
 Keep walking  Medication Instructions:  No changes  If you need a refill on your cardiac medications before your next appointment, please call your pharmacy.   Lab work: No new labs needed  Testing/Procedures: No new testing needed  Follow-Up: At [facility], you and your health needs are our priority.  As part of our continuing mission to provide you with exceptional heart care, we have created designated Provider Care Teams.  These Care Teams include your primary Cardiologist (physician) and Advanced Practice Providers (APPs -  Physician Assistants and Nurse Practitioners) who all work together to provide you with the care you need, when you need it.  You will need a follow up appointment in 6 months, APP ok  Providers on your designated Care Team:   Laneta Pintos, NP Varney Gentleman, PA-C Cadence Gennaro Khat, [place]  COVID-19 Vaccine Information can be found at: [email or web address] For questions related to vaccine distribution or appointments, please email [email or web address] or call [phone].

## 2023-06-18 DIAGNOSIS — L281 Prurigo nodularis: Secondary | ICD-10-CM | POA: Diagnosis not present

## 2023-06-18 DIAGNOSIS — Z79899 Other long term (current) drug therapy: Secondary | ICD-10-CM | POA: Diagnosis not present

## 2023-07-26 ENCOUNTER — Other Ambulatory Visit: Payer: Medicare PPO

## 2023-07-29 ENCOUNTER — Ambulatory Visit: Payer: Medicare PPO | Admitting: Internal Medicine

## 2023-08-07 ENCOUNTER — Telehealth: Payer: Self-pay | Admitting: Cardiovascular Disease

## 2023-08-07 NOTE — Telephone Encounter (Signed)
 Called patient and notified her that Dr. Gollan has not had a chance to complete paper work. Patient informed that we would notify her when the forms are complete. Patient verbalizes appreciation for call.

## 2023-08-07 NOTE — Telephone Encounter (Signed)
 Patient stated she left insurance paperwork for Dr. Perla to complete at her last office visit 5/5.  Patient is following-up to see if paperwork has been completed.

## 2023-08-08 ENCOUNTER — Other Ambulatory Visit

## 2023-08-08 ENCOUNTER — Ambulatory Visit: Payer: Self-pay | Admitting: Internal Medicine

## 2023-08-08 DIAGNOSIS — E7800 Pure hypercholesterolemia, unspecified: Secondary | ICD-10-CM

## 2023-08-08 DIAGNOSIS — E1165 Type 2 diabetes mellitus with hyperglycemia: Secondary | ICD-10-CM | POA: Diagnosis not present

## 2023-08-08 DIAGNOSIS — I10 Essential (primary) hypertension: Secondary | ICD-10-CM

## 2023-08-08 LAB — LIPID PANEL
Cholesterol: 136 mg/dL (ref 0–200)
HDL: 50.4 mg/dL (ref >39.00)
LDL Cholesterol: 74 mg/dL (ref 0–99)
NonHDL: 85.48
Total CHOL/HDL Ratio: 3
Triglycerides: 55 mg/dL (ref 0.0–149.0)
VLDL: 11 mg/dL (ref 0.0–40.0)

## 2023-08-08 LAB — BASIC METABOLIC PANEL WITH GFR
BUN: 17 mg/dL (ref 6–23)
CO2: 32 meq/L (ref 19–32)
Calcium: 9.3 mg/dL (ref 8.4–10.5)
Chloride: 106 meq/L (ref 96–112)
Creatinine, Ser: 0.73 mg/dL (ref 0.40–1.20)
GFR: 76.76 mL/min (ref >60.00)
Glucose, Bld: 103 mg/dL — ABNORMAL HIGH (ref 70–99)
Potassium: 4.4 meq/L (ref 3.5–5.1)
Sodium: 143 meq/L (ref 135–145)

## 2023-08-08 LAB — HEPATIC FUNCTION PANEL
ALT: 13 U/L (ref 0–35)
AST: 15 U/L (ref 0–37)
Albumin: 4.3 g/dL (ref 3.5–5.2)
Alkaline Phosphatase: 53 U/L (ref 39–117)
Bilirubin, Direct: 0.3 mg/dL (ref 0.0–0.3)
Total Bilirubin: 1.6 mg/dL — ABNORMAL HIGH (ref 0.2–1.2)
Total Protein: 6.7 g/dL (ref 6.0–8.3)

## 2023-08-08 LAB — HEMOGLOBIN A1C: Hgb A1c MFr Bld: 6.8 % — ABNORMAL HIGH (ref 4.6–6.5)

## 2023-08-09 NOTE — Telephone Encounter (Signed)
 Called patient and notified her that forms have been completed and ready to be picked up. Patient verbalizes understanding.

## 2023-08-13 ENCOUNTER — Ambulatory Visit: Admitting: Internal Medicine

## 2023-08-13 ENCOUNTER — Encounter: Payer: Self-pay | Admitting: Internal Medicine

## 2023-08-13 VITALS — BP 136/78 | HR 57 | Temp 97.7°F | Ht 64.0 in | Wt 179.4 lb

## 2023-08-13 DIAGNOSIS — I4819 Other persistent atrial fibrillation: Secondary | ICD-10-CM | POA: Diagnosis not present

## 2023-08-13 DIAGNOSIS — R300 Dysuria: Secondary | ICD-10-CM

## 2023-08-13 DIAGNOSIS — F439 Reaction to severe stress, unspecified: Secondary | ICD-10-CM | POA: Diagnosis not present

## 2023-08-13 DIAGNOSIS — Z0000 Encounter for general adult medical examination without abnormal findings: Secondary | ICD-10-CM

## 2023-08-13 DIAGNOSIS — E7800 Pure hypercholesterolemia, unspecified: Secondary | ICD-10-CM

## 2023-08-13 DIAGNOSIS — E1165 Type 2 diabetes mellitus with hyperglycemia: Secondary | ICD-10-CM | POA: Diagnosis not present

## 2023-08-13 DIAGNOSIS — I10 Essential (primary) hypertension: Secondary | ICD-10-CM

## 2023-08-13 LAB — URINALYSIS, ROUTINE W REFLEX MICROSCOPIC
Bilirubin Urine: NEGATIVE
Ketones, ur: NEGATIVE
Nitrite: POSITIVE — AB
Specific Gravity, Urine: <=1.005 — AB (ref 1.000–1.030)
Total Protein, Urine: NEGATIVE
Urine Glucose: NEGATIVE
Urobilinogen, UA: 0.2 (ref 0.0–1.0)
pH: 6.5 (ref 5.0–8.0)

## 2023-08-13 NOTE — Assessment & Plan Note (Signed)
 Physical today 08/13/23.  Mammogram 04/29/23 - Birads I.  Colonoscopy 02/2014 - normal.  Had recommended f/u colonoscopy 5 years. Saw Dr Jinny 02/2020 - no follow up colonoscopy at that time.

## 2023-08-13 NOTE — Progress Notes (Signed)
 Subjective:    Patient ID: Lori Guzman, female    DOB: 08-06-1940, 83 y.o.   MRN: [id number]  Patient here for  Chief Complaint  Patient presents with   Annual Exam    HPI Here for a physical exam. Saw cardiology 06/17/23 - f/u afib s/p cardioversion 02/26/23. Continue eliquis . Continue metoprolol . She reports she is doing relatively well. Stays active. No chest pain or sob reported. No abdominal pain or bowel change reported. Does report some dysuria - x 1 day. Was concerned regarding possible UTI. No vaginal symptoms.    Past Medical History:  Diagnosis Date   Allergy    reoccuring problems   GERD (gastroesophageal reflux disease)    History of colon polyps    adenomatous   Hyperlipidemia    Hypertension    Past Surgical History:  Procedure Laterality Date   BARTHOLIN GLAND CYST EXCISION     CARDIOVERSION N/A 02/26/2023   Procedure: CARDIOVERSION;  Surgeon: Perla Evalene PARAS, MD;  Location: [facility] ORS;  Service: Cardiovascular;  Laterality: N/A;   CHOLECYSTECTOMY     NASAL POLYP SURGERY  1964   TONSILECTOMY/ADENOIDECTOMY WITH MYRINGOTOMY  1950   Family History  Problem Relation Age of Onset   Heart disease Mother    CVA Mother    Hypertension Mother    Alzheimer's disease Mother    Esophageal cancer Father    Breast cancer Neg Hx    Colon cancer Neg Hx    Social History   Socioeconomic History   Marital status: Widowed    Spouse name: Not on file   Number of children: Not on file   Years of education: Not on file   Highest education level: Associate degree: academic program  Occupational History   Not on file  Tobacco Use   Smoking status: Never    Passive exposure: Past   Smokeless tobacco: Never  Vaping Use   Vaping status: Never Used  Substance and Sexual Activity   Alcohol use: No    Alcohol/week: 0.0 standard drinks of alcohol   Drug use: No   Sexual activity: Not on file  Other Topics Concern   Not on file  Social History Narrative   She  is married, has 3 children and is retired.   Social Drivers of Corporate investment banker Strain: Low Risk  (08/06/2023)   Overall Financial Resource Strain (CARDIA)    Difficulty of Paying Living Expenses: Not very hard  Food Insecurity: No Food Insecurity (08/06/2023)   Hunger Vital Sign    Worried About Running Out of Food in the Last Year: Never true    Ran Out of Food in the Last Year: Never true  Transportation Needs: No Transportation Needs (08/06/2023)   PRAPARE - Administrator, Civil Service (Medical): No    Lack of Transportation (Non-Medical): No  Physical Activity: Insufficiently Active (08/06/2023)   Exercise Vital Sign    Days of Exercise per Week: 3 days    Minutes of Exercise per Session: 30 min  Stress: No Stress Concern Present (08/06/2023)   [employer] of Occupational Health - Occupational Stress Questionnaire    Feeling of Stress: Not at all  Social Connections: Moderately Isolated (08/06/2023)   Social Connection and Isolation Panel    Frequency of Communication with Friends and Family: More than three times a week    Frequency of Social Gatherings with Friends and Family: More than three times a week    Attends Religious  Services: More than 4 times per year    Active Member of Clubs or Organizations: No    Attends Banker Meetings: Not on file    Marital Status: Widowed     Review of Systems  Constitutional:  Negative for appetite change and unexpected weight change.  HENT:  Negative for congestion, sinus pressure and sore throat.   Eyes:  Negative for pain and visual disturbance.  Respiratory:  Negative for cough, chest tightness and shortness of breath.   Cardiovascular:  Negative for chest pain, palpitations and leg swelling.  Gastrointestinal:  Negative for abdominal pain, diarrhea, nausea and vomiting.  Genitourinary:  Positive for dysuria. Negative for difficulty urinating.  Musculoskeletal:  Negative for joint swelling  and myalgias.  Skin:  Negative for color change and rash.  Neurological:  Negative for dizziness and headaches.  Hematological:  Negative for adenopathy. Does not bruise/bleed easily.  Psychiatric/Behavioral:  Negative for agitation and dysphoric mood.        Objective:     BP 136/78   Pulse (!) 57   Temp 97.7 F (36.5 C) (Oral)   Ht 5' 4 (1.626 m)   Wt 179 lb 6.4 oz (81.4 kg)   LMP 02/29/1992   SpO2 97%   BMI 30.79 kg/m  Wt Readings from Last 3 Encounters:  08/13/23 179 lb 6.4 oz (81.4 kg)  06/17/23 180 lb 4 oz (81.8 kg)  06/03/23 181 lb 6.4 oz (82.3 kg)    Physical Exam Vitals reviewed.  Constitutional:      General: She is not in acute distress.    Appearance: Normal appearance. She is well-developed.  HENT:     Head: Normocephalic and atraumatic.     Right Ear: External ear normal.     Left Ear: External ear normal.     Mouth/Throat:     Pharynx: No oropharyngeal exudate or posterior oropharyngeal erythema.  Eyes:     General: No scleral icterus.       Right eye: No discharge.        Left eye: No discharge.     Conjunctiva/sclera: Conjunctivae normal.  Neck:     Thyroid : No thyromegaly.  Cardiovascular:     Rate and Rhythm: Normal rate and regular rhythm.  Pulmonary:     Effort: No tachypnea, accessory muscle usage or respiratory distress.     Breath sounds: Normal breath sounds. No decreased breath sounds or wheezing.  Chest:  Breasts:    Right: No inverted nipple, mass, nipple discharge or tenderness (no axillary adenopathy).     Left: No inverted nipple, mass, nipple discharge or tenderness (no axilarry adenopathy).  Abdominal:     General: Bowel sounds are normal.     Palpations: Abdomen is soft.     Tenderness: There is no abdominal tenderness.  Musculoskeletal:        General: No swelling or tenderness.     Cervical back: Neck supple.  Lymphadenopathy:     Cervical: No cervical adenopathy.  Skin:    Findings: No erythema or rash.   Neurological:     Mental Status: She is alert and oriented to person, place, and time.  Psychiatric:        Mood and Affect: Mood normal.        Behavior: Behavior normal.         Outpatient Encounter Medications as of 08/13/2023  Medication Sig   ELIQUIS  5 MG TABS tablet TAKE 1 TABLET BY MOUTH TWICE A DAY  escitalopram  (LEXAPRO ) 20 MG tablet TAKE 1 TABLET BY MOUTH EVERY DAY   fluticasone (FLONASE) 50 MCG/ACT nasal spray Place 1 spray into both nostrils daily as needed (Congestion).   metoprolol  succinate (TOPROL -XL) 25 MG 24 hr tablet Take 1 tablet (25 mg total) by mouth 2 (two) times daily.   Multiple Vitamins-Minerals (ICAPS AREDS FORMULA PO) Take 1 tablet by mouth daily.   Polyethyl Glycol-Propyl Glycol (SYSTANE) 0.4-0.3 % SOLN Place 1 drop into both eyes daily as needed (Dry eye).   pravastatin  (PRAVACHOL ) 10 MG tablet TAKE ONE TABLET EVERY MONDAY, WEDNESDAY AND FRIDAY   Probiotic Product (PROBIOTIC PO) Take 1 capsule by mouth daily.   telmisartan  (MICARDIS ) 80 MG tablet Take 1 tablet (80 mg total) by mouth daily.   [DISCONTINUED] furosemide  (LASIX ) 20 MG tablet Take 20 mg by mouth daily as needed for edema (weight gain of greater than 3 pounds in one day or 5 pounds in one week.). (Patient not taking: Reported on 06/17/2023)   [DISCONTINUED] potassium chloride  (KLOR-CON ) 10 MEQ tablet Take 10 mEq by mouth daily as needed (take when you take a Furosemide .). (Patient not taking: Reported on 06/17/2023)   No facility-administered encounter medications on file as of 08/13/2023.     Lab Results  Component Value Date   WBC 5.5 04/08/2023   HGB 12.7 04/08/2023   HCT 38.1 04/08/2023   PLT 220.0 04/08/2023   GLUCOSE 103 (H) 08/08/2023   CHOL 136 08/08/2023   TRIG 55.0 08/08/2023   HDL 50.40 08/08/2023   LDLCALC 74 08/08/2023   ALT 13 08/08/2023   AST 15 08/08/2023   NA 143 08/08/2023   K 4.4 08/08/2023   CL 106 08/08/2023   CREATININE 0.73 08/08/2023   BUN 17 08/08/2023   CO2 32  08/08/2023   TSH 2.39 04/08/2023   HGBA1C 6.8 (H) 08/08/2023    CT Chest High Resolution Result Date: 05/17/2023 CLINICAL DATA:  Dyspnea on exertion. Follow-up interstitial lung disease. EXAM: CT CHEST WITHOUT CONTRAST TECHNIQUE: Multidetector CT imaging of the chest was performed following the standard protocol without intravenous contrast. High resolution imaging of the lungs, as well as inspiratory and expiratory imaging, was performed. RADIATION DOSE REDUCTION: This exam was performed according to the departmental dose-optimization program which includes automated exposure control, adjustment of the mA and/or kV according to patient size and/or use of iterative reconstruction technique. COMPARISON:  08/08/2022 high-resolution chest CT. 03/22/2023 chest radiograph. FINDINGS: Cardiovascular: Top-normal heart size. No significant pericardial effusion/thickening. Left anterior descending coronary atherosclerosis. Atherosclerotic nonaneurysmal thoracic aorta. Normal caliber pulmonary arteries. Mediastinum/Nodes: No significant thyroid  nodules. Unremarkable esophagus. No pathologically enlarged axillary, mediastinal or hilar lymph nodes, noting limited sensitivity for the detection of hilar adenopathy on this noncontrast study. Lungs/Pleura: No pneumothorax. No pleural effusion. No acute consolidative airspace disease, lung masses or significant pulmonary nodules. Mild patchy air trapping in both lungs is unchanged. No evidence of tracheobronchomalacia on the expiration sequence. Scattered thin curvilinear parenchymal bands and very mild patchy peripheral reticulation and ground-glass opacity in the lower lungs bilaterally without significant regions of traction bronchiectasis, architectural distortion or frank honeycombing. The ground-glass opacities are decreased since 08/08/2022 CT. No new or progressive findings. Upper abdomen: Small hiatal hernia. Simple 1.0 cm liver dome cyst. Cholecystectomy.  Musculoskeletal: No aggressive appearing focal osseous lesions. Moderate thoracic spondylosis. IMPRESSION: 1. Mild patchy air trapping in both lungs, indicative of small airways disease, unchanged. 2. Mild patchy peripheral reticulation and ground-glass opacity at the lung bases without significant bronchiectasis or honeycombing. These  findings have improved since 08/08/2022 chest CT, favoring bland mild postinfectious/postinflammatory scarring, with a mild interstitial lung disease such as NSIP not entirely excluded but less favored. 3. One vessel coronary atherosclerosis. 4. Small hiatal hernia. 5.  Aortic Atherosclerosis (ICD10-I70.0). Electronically Signed   By: Selinda DELENA Blue M.D.   On: 05/17/2023 17:11       Assessment & Plan:  Routine general medical examination at a health care facility  Health care maintenance Assessment & Plan: Physical today 08/13/23.  Mammogram 04/29/23 - Birads I.  Colonoscopy 02/2014 - normal.  Had recommended f/u colonoscopy 5 years. Saw Dr Jinny 02/2020 - no follow up colonoscopy at that time.     Dysuria Assessment & Plan: Check urinalysis and culture to confirm if infection.   Orders: -     Urinalysis, Routine w reflex microscopic -     Urine Culture  Persistent atrial fibrillation [facility]) Assessment & Plan: Status post cardioversion 02/26/2023.  Echocardiogram as outlined.  Continue Eliquis  and metoprolol .  Appears to be in sinus rhythm. Overall doing well. No change in medication.    Type 2 diabetes mellitus with hyperglycemia, without long-term current use of insulin (HCC) Assessment & Plan: Low-carb diet and exercise.  On no medication. Follow met b and A1c.  Lab Results  Component Value Date   HGBA1C 6.8 (H) 08/08/2023     Stress Assessment & Plan: Continue lexapro . Appears to be doing well. Follow.    Primary hypertension Assessment & Plan: Continue Micardis .  Also on metoprolol .  Blood pressure as outlined.  No changes.  Followed pressures.  Follow metabolic panel.    Hypercholesterolemia Assessment & Plan: Continue pravastatin .  Low-cholesterol diet and exercise.  Follow lipid panel and liver function testing.   Hyperbilirubinemia Assessment & Plan: Has varied intermittently through the years.  Bilirubin has been overall stable.  Previous abdominal ultrasound okay.  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  Follow liver panel.      Allena Hamilton, MD

## 2023-08-14 ENCOUNTER — Ambulatory Visit: Payer: Self-pay | Admitting: Internal Medicine

## 2023-08-14 ENCOUNTER — Other Ambulatory Visit: Payer: Self-pay

## 2023-08-14 DIAGNOSIS — R300 Dysuria: Secondary | ICD-10-CM

## 2023-08-14 DIAGNOSIS — E1165 Type 2 diabetes mellitus with hyperglycemia: Secondary | ICD-10-CM

## 2023-08-14 LAB — URINE CULTURE
MICRO NUMBER:: 16646648
SPECIMEN QUALITY:: ADEQUATE

## 2023-08-14 MED ORDER — CEFDINIR 300 MG PO CAPS: 300.0000 mg | ORAL_CAPSULE | Freq: Two times a day (BID) | ORAL | 0 refills | Status: DC

## 2023-08-14 NOTE — Telephone Encounter (Signed)
 See result note.

## 2023-08-15 NOTE — Addendum Note (Signed)
 Addended by: Adrie Picking on: 08/15/2023 11:20 AM   Modules accepted: Orders

## 2023-08-19 ENCOUNTER — Encounter: Payer: Self-pay | Admitting: Internal Medicine

## 2023-08-19 NOTE — Assessment & Plan Note (Signed)
 Has varied intermittently through the years.  Bilirubin has been overall stable.  Previous abdominal ultrasound okay.  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  Follow liver panel.

## 2023-08-19 NOTE — Assessment & Plan Note (Signed)
 Status post cardioversion 02/26/2023.  Echocardiogram as outlined.  Continue Eliquis  and metoprolol .  Appears to be in sinus rhythm. Overall doing well. No change in medication.

## 2023-08-19 NOTE — Assessment & Plan Note (Signed)
Continue lexapro.  Appears to be doing well.  Follow.

## 2023-08-19 NOTE — Assessment & Plan Note (Signed)
 Check urinalysis and culture to confirm if infection.

## 2023-08-19 NOTE — Assessment & Plan Note (Signed)
 Low-carb diet and exercise.  On no medication. Follow met b and A1c.  Lab Results  Component Value Date   HGBA1C 6.8 (H) 08/08/2023

## 2023-08-19 NOTE — Assessment & Plan Note (Signed)
 Continue pravastatin.  Low-cholesterol diet and exercise.  Follow lipid panel and liver function testing.

## 2023-08-19 NOTE — Assessment & Plan Note (Signed)
 Continue Micardis .  Also on metoprolol .  Blood pressure as outlined.  No changes.  Followed pressures. Follow metabolic panel.

## 2023-08-23 ENCOUNTER — Ambulatory Visit: Admitting: Pulmonary Disease

## 2023-08-23 ENCOUNTER — Encounter: Payer: Self-pay | Admitting: Pulmonary Disease

## 2023-08-23 VITALS — BP 150/60 | HR 60 | Temp 98.4°F | Ht 64.0 in | Wt 177.8 lb

## 2023-08-23 DIAGNOSIS — J4530 Mild persistent asthma, uncomplicated: Secondary | ICD-10-CM | POA: Diagnosis not present

## 2023-08-23 DIAGNOSIS — I272 Pulmonary hypertension, unspecified: Secondary | ICD-10-CM

## 2023-08-23 DIAGNOSIS — J8410 Pulmonary fibrosis, unspecified: Secondary | ICD-10-CM | POA: Diagnosis not present

## 2023-08-23 DIAGNOSIS — I480 Paroxysmal atrial fibrillation: Secondary | ICD-10-CM

## 2023-08-23 DIAGNOSIS — R0602 Shortness of breath: Secondary | ICD-10-CM

## 2023-08-23 LAB — NITRIC OXIDE: Nitric Oxide: 31

## 2023-08-23 MED ORDER — LEVALBUTEROL TARTRATE 45 MCG/ACT IN AERO: 2.0000 | INHALATION_SPRAY | RESPIRATORY_TRACT | 2 refills | Status: DC | PRN

## 2023-08-23 MED ORDER — ARNUITY ELLIPTA 100 MCG/ACT IN AEPB: 1.0000 | INHALATION_SPRAY | Freq: Every day | RESPIRATORY_TRACT | 11 refills | Status: AC

## 2023-08-23 NOTE — Progress Notes (Signed)
 Subjective:    Patient ID: Lori Guzman, female    DOB: [birth year], 83 y.o.   MRN: [id number]  Patient Care Team: Glendia Shad, MD as PCP - General (Internal Medicine)  Chief Complaint  Patient presents with   Follow-up    Occasional shortness of breath on exertion. No cough or wheezing.     BACKGROUND/INTERVAL:Patient presents for evaluation of dyspnea and abnormal CT chest suggestive of interstitial lung disease.  She was previously evaluated by Dr. 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  Patient transferred care to our practice in [place] due to transportation issues.  She was initially seen by me on 02 April 2023.  Last visit here was 24 May 2023.  HPI Discussed the use of AI scribe software for clinical note transcription with the patient, who gave verbal consent to proceed.  History of Present Illness   Lori Guzman is an 83 year old female with a history of pneumonia and atrial fibrillation who presents with breathing difficulties. She was previously referred by Dr. Glendia for evaluation of her breathing difficulties.  She describes her breathing difficulties as a sensation of 'a frog in my throat' and drainage, particularly in the mornings. Air quality and humidity exacerbate her symptoms. She has a history of allergies, for which she took shots for seven years, and has undergone two nasal surgeries due to polyp growth.  In December, she had pneumonia, followed by a heart procedure in January where she underwent cardioversion twice. She believes these events have contributed to her current breathing difficulties.  She has a history of atrial fibrillation and is being followed by cardiology.  Pulmonary function testing performed 03 June 2023 showed essentially normal lung function however postbronchodilator the patient's FEV1 improved significantly, she also was noted to have  hyperinflation and air trapping.  Diffusion capacity was normal.  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  She experienced a fall in her driveway, resulting in head and leg injuries, which she feels have prolonged her recovery.  She is now fairly well recovered from these issues.     Review of Systems A 10 point review of systems was performed and it is as noted above otherwise negative.   Patient Active Problem List   Diagnosis Date Noted   Dysuria 08/13/2023   UTI (urinary tract infection) 05/06/2023   DOE (dyspnea on exertion) 04/02/2023   Wheezing 03/24/2023   SOB (shortness of breath) 02/26/2023   Atrial fibrillation (HCC) 11/24/2022   Irregular heart beat 11/23/2022   Hyperbilirubinemia 11/23/2022   Post-nasal drainage 07/29/2022   Abnormal CXR 07/24/2022   Acute cough 07/19/2022   Pneumonia 01/14/2022   Chronic bronchitis (HCC) 12/29/2021   Itching 11/05/2021   Otitis media 10/13/2021   Rash 03/19/2021   Muscle ache 03/16/2021   Right knee pain 09/27/2020   Sinus congestion 08/15/2020   Thumb pain, left 03/27/2020   Lower abdominal pain 03/03/2017   Dizziness 02/03/2016   Neck pain 01/15/2016   Fatigue 08/22/2015   Near syncope 08/22/2015   Light headedness 08/10/2014   BMI 31.0-31.9,adult 04/05/2014   Health care maintenance 04/05/2014   Back pain 12/06/2013   Hypercholesterolemia 12/06/2013   Gum lesion 07/19/2013   Stress 01/21/2013   Diabetes (HCC) 08/15/2012   GERD (gastroesophageal reflux disease) 06/09/2012   History of colonic polyps 06/09/2012   Hypertension 12/17/2011    Social History  Tobacco Use   Smoking status: Never    Passive exposure: Past   Smokeless tobacco: Never  Substance Use Topics   Alcohol use: No    Alcohol/week: 0.0 standard drinks of alcohol    Allergies  Allergen Reactions   Other Itching and Dermatitis    Seasonal allergies    Current Meds  Medication Sig   ELIQUIS  5 MG  TABS tablet TAKE 1 TABLET BY MOUTH TWICE A DAY   escitalopram  (LEXAPRO ) 20 MG tablet TAKE 1 TABLET BY MOUTH EVERY DAY   fluticasone (FLONASE) 50 MCG/ACT nasal spray Place 1 spray into both nostrils daily as needed (Congestion).   Fluticasone Furoate  (ARNUITY ELLIPTA ) 100 MCG/ACT AEPB Inhale 1 puff into the lungs daily.   levalbuterol  (XOPENEX  HFA) 45 MCG/ACT inhaler Inhale 2 puffs into the lungs every 4 (four) hours as needed for wheezing.   metoprolol  succinate (TOPROL -XL) 25 MG 24 hr tablet Take 1 tablet (25 mg total) by mouth 2 (two) times daily.   Multiple Vitamins-Minerals (ICAPS AREDS FORMULA PO) Take 1 tablet by mouth daily.   Polyethyl Glycol-Propyl Glycol (SYSTANE) 0.4-0.3 % SOLN Place 1 drop into both eyes daily as needed (Dry eye).   pravastatin  (PRAVACHOL ) 10 MG tablet TAKE ONE TABLET EVERY MONDAY, WEDNESDAY AND FRIDAY   Probiotic Product (PROBIOTIC PO) Take 1 capsule by mouth daily.   telmisartan  (MICARDIS ) 80 MG tablet Take 1 tablet (80 mg total) by mouth daily.    Immunization History  Administered Date(s) Administered   Fluad Quad(high Dose 65+) 01/13/2020, 10/30/2021   Influenza Split 02/29/2012   Influenza, High Dose Seasonal PF 12/05/2016, 12/29/2018   Influenza,inj,Quad PF,6+ Mos 01/19/2013, 11/30/2013, 12/09/2014, 11/08/2017   Influenza-Unspecified 11/15/2015, 11/12/2020, 11/23/2022   PFIZER(Purple Top)SARS-COV-2 Vaccination 04/01/2019, 04/27/2019   Pneumococcal Conjugate-13 08/09/2014   Pneumococcal Polysaccharide-23 06/09/2012        Objective:     BP (!) 150/60 (BP Location: Left Arm, Patient Position: Sitting, Cuff Size: Normal)   Pulse 60   Temp 98.4 F (36.9 C) (Oral)   Ht 5' 4 (1.626 m)   Wt 177 lb 12.8 oz (80.6 kg)   LMP 02/29/1992   SpO2 95%   BMI 30.52 kg/m   SpO2: 95 %  GENERAL: Overweight woman, no acute distress, fully ambulatory, no conversational dyspnea. HEAD: Normocephalic, atraumatic.  EYES: Pupils equal, round, reactive to light.   No scleral icterus.  MOUTH: Dentition intact, oral mucosa moist.  No thrush. NECK: Supple. No thyromegaly. Trachea midline. No JVD.  No adenopathy. PULMONARY: Good air entry bilaterally.  Coarse, otherwise, no adventitious sounds. CARDIOVASCULAR: S1 and S2. Regular rate and rhythm.  Grade 2/6 mitral regurgitation murmur. ABDOMEN: Obese, otherwise benign. MUSCULOSKELETAL: No joint deformity, no clubbing, no edema.  NEUROLOGIC: No overt focal deficit, no gait disturbance, speech is fluent. SKIN: Intact,warm,dry. PSYCH: Mood and behavior normal.  Lab Results  Component Value Date   NITRICOXIDE 31 08/23/2023  *Intermediate type II inflammation present       Assessment & Plan:     ICD-10-CM   1. Mild persistent asthma without complication  J45.30     2. 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 Pulmonary HTN (HCC)  I27.20     5. Paroxysmal atrial fibrillation (HCC)  I48.0       Orders Placed This Encounter  Procedures   Nitric oxide     Meds ordered this encounter  Medications   Fluticasone Furoate  (  ARNUITY ELLIPTA ) 100 MCG/ACT AEPB    Sig: Inhale 1 puff into the lungs daily.    Dispense:  30 each    Refill:  11   levalbuterol  (XOPENEX  HFA) 45 MCG/ACT inhaler    Sig: Inhale 2 puffs into the lungs every 4 (four) hours as needed for wheezing.    Dispense:  1 each    Refill:  2   Discussion:    Persistent asthma Mild asthma with type II inflammation, exacerbated by poor air quality and humidity. Symptoms include morning throat discomfort and drainage. Pulmonary function tests indicate reversible airway obstruction with inhaler use. - Patient had intermediate levels of nitric oxide  consistent with type II inflammation. - Prescribe Arnuity 100 inhaler, one puff daily, with instructions to rinse mouth after use. - Prescribe rescue inhaler (Xopenex ) for chest tightness, wheezing, or persistent cough. - Schedule  follow-up appointment in three months.     Advised if symptoms do not improve or worsen, to please contact office for sooner follow up or seek emergency care.    I spent 40 minutes of dedicated to the care of this patient on the date of this encounter to include pre-visit review of records, face-to-face time with the patient discussing conditions above, post visit ordering of testing, clinical documentation with the electronic health record, making appropriate referrals as documented, and communicating necessary findings to members of the patients care team.     C. Leita Sanders, MD Advanced Bronchoscopy PCCM [facility]    *This note was generated using voice recognition software/Dragon and/or AI transcription program.  Despite best efforts to proofread, errors can occur which can change the meaning. Any transcriptional errors that result from this process are unintentional and may not be fully corrected at the time of dictation.

## 2023-08-23 NOTE — Patient Instructions (Signed)
 VISIT SUMMARY:  Today, you were seen for breathing difficulties, which you described as a sensation of 'a frog in your throat' and drainage, especially in the mornings. You have a history of allergies, asthma, and atrial fibrillation, and you recently had pneumonia and a heart procedure. Your symptoms are worsened by poor air quality and humidity.  YOUR PLAN:  -ASTHMA: Asthma is a condition where your airways become inflamed and narrow, making it hard to breathe. Your asthma is mild but worsened by poor air quality and humidity. We will check your airway inflammation levels and have prescribed an Arnuity inhaler for daily use. Please take one puff daily and rinse your mouth afterward. Additionally, you have a rescue inhaler to use if you experience chest tightness, wheezing, or a persistent cough. We will see you again in three months to follow up on your condition.  INSTRUCTIONS:  Please schedule a follow-up appointment in three months to monitor your asthma and adjust treatment if necessary.

## 2023-08-27 ENCOUNTER — Other Ambulatory Visit: Payer: Self-pay | Admitting: Internal Medicine

## 2023-08-27 NOTE — Telephone Encounter (Signed)
 Copied from CRM [id number]. Topic: Clinical - Medication Refill >> Aug 27, 2023  8:17 AM [place] M wrote: Medication: ELIQUIS  5 MG TABS tablet  Has the patient contacted their pharmacy? Yes (Agent: If no, request that the patient contact the pharmacy for the refill. If patient does not wish to contact the pharmacy document the reason why and proceed with request.) (Agent: If yes, when and what did the pharmacy advise?)  This is the patient's preferred pharmacy:  [facility] [phone] [address], [place] - [address] [place] [address] Phone: [phone] Fax: [phone]  Is this the correct pharmacy for this prescription? Yes If no, delete pharmacy and type the correct one.   Has the prescription been filled recently? Yes  Is the patient out of the medication? Yes  Has the patient been seen for an appointment in the last year OR does the patient have an upcoming appointment? Yes  Can we respond through MyChart? Yes  Agent: Please be advised that Rx refills may take up to 3 business days. We ask that you follow-up with your pharmacy.

## 2023-09-03 ENCOUNTER — Telehealth: Payer: Self-pay

## 2023-09-03 NOTE — Telephone Encounter (Signed)
 Copied from CRM [id number]. Topic: General - Other >> Sep 03, 2023  4:18 PM Aisha D wrote: Reason for CRM: Pt stated that she usually see's Dr.Scott after coming in for labs. Pt would like to know if she needs to schedule an appt with Dr.Scott after her lab appt on 8/6. Pt would like a callback with an update on this concern.

## 2023-09-04 NOTE — Telephone Encounter (Signed)
 This is just a f/u urine to confirm blood has cleared from urine. She does not need a f/u appt with me. If she feels she needs one or having any issues, can let me know and will have to work in

## 2023-09-05 NOTE — Telephone Encounter (Signed)
 Pt is aware and no appointment needed.

## 2023-09-09 ENCOUNTER — Other Ambulatory Visit

## 2023-09-13 ENCOUNTER — Telehealth: Payer: Self-pay | Admitting: Internal Medicine

## 2023-09-13 NOTE — Telephone Encounter (Signed)
 [email or web address] came in and picked up her paper work.

## 2023-09-18 ENCOUNTER — Other Ambulatory Visit (INDEPENDENT_AMBULATORY_CARE_PROVIDER_SITE_OTHER)

## 2023-09-18 DIAGNOSIS — R300 Dysuria: Secondary | ICD-10-CM | POA: Diagnosis not present

## 2023-09-18 DIAGNOSIS — E1165 Type 2 diabetes mellitus with hyperglycemia: Secondary | ICD-10-CM

## 2023-09-18 LAB — URINALYSIS, ROUTINE W REFLEX MICROSCOPIC
Bilirubin Urine: NEGATIVE
Hgb urine dipstick: NEGATIVE
Leukocytes,Ua: NEGATIVE
Nitrite: NEGATIVE
Specific Gravity, Urine: 1.025 (ref 1.000–1.030)
Urine Glucose: NEGATIVE
Urobilinogen, UA: 1 (ref 0.0–1.0)
pH: 6 (ref 5.0–8.0)

## 2023-09-18 LAB — MICROALBUMIN / CREATININE URINE RATIO
Creatinine,U: 185.7 mg/dL
Microalb Creat Ratio: 40.3 mg/g — ABNORMAL HIGH (ref 0.0–30.0)
Microalb, Ur: 7.5 mg/dL — ABNORMAL HIGH (ref 0.0–1.9)

## 2023-09-19 ENCOUNTER — Ambulatory Visit: Payer: Self-pay | Admitting: Internal Medicine

## 2023-11-04 ENCOUNTER — Ambulatory Visit: Attending: Medical | Admitting: Medical

## 2023-11-04 ENCOUNTER — Telehealth: Payer: Self-pay | Admitting: Cardiovascular Disease

## 2023-11-04 ENCOUNTER — Encounter: Payer: Self-pay | Admitting: Medical

## 2023-11-04 VITALS — BP 120/82 | HR 111 | Wt 176.0 lb

## 2023-11-04 DIAGNOSIS — I10 Essential (primary) hypertension: Secondary | ICD-10-CM | POA: Diagnosis not present

## 2023-11-04 DIAGNOSIS — E1165 Type 2 diabetes mellitus with hyperglycemia: Secondary | ICD-10-CM

## 2023-11-04 DIAGNOSIS — I4819 Other persistent atrial fibrillation: Secondary | ICD-10-CM | POA: Diagnosis not present

## 2023-11-04 DIAGNOSIS — R600 Localized edema: Secondary | ICD-10-CM

## 2023-11-04 MED ORDER — AMIODARONE HCL 200 MG PO TABS
ORAL_TABLET | ORAL | 1 refills | Status: DC
Start: 2023-11-04 — End: 2023-11-25

## 2023-11-04 NOTE — Telephone Encounter (Signed)
 Called patient to assess per message - same day appointment at 1:55 with Cadence Franchester, [place]

## 2023-11-04 NOTE — Patient Instructions (Addendum)
 Medication Instructions:  Your physician recommends the following medication changes.  START TAKING: Amiodarone  400 mg (2 tablets) by mouth twice a day for 1 week, then decrease to 200 mg (1 tablet) twice a day for 1 week, followed by 200 mg (1 tablet) daily thereafter.   *If you need a refill on your cardiac medications before your next appointment, please call your pharmacy*  Lab Work: No labs ordered today    Testing/Procedures: No test ordered today   Follow-Up: At [facility], you and your health needs are our priority.  As part of our continuing mission to provide you with exceptional heart care, our providers are all part of one team.  This team includes your primary Cardiologist (physician) and Advanced Practice Providers or APPs (Physician Assistants and Nurse Practitioners) who all work together to provide you with the care you need, when you need it.  Your next appointment:   3 week(s)  Provider:   Mikey Fishman, PA-C

## 2023-11-04 NOTE — Telephone Encounter (Signed)
  Patient c/o Palpitations: STAT if patient c/o lightheadedness, shortness of breath, or chest pain  How long have you had palpitations/irregular HR/ Afib? Are you having the symptoms now? Started last Friday morning, yes has had them this morning  Are you currently experiencing lightheadedness, SOB or CP? No, just some fatigue   Do you have a history of afib (atrial fibrillation) or irregular heart rhythm? yes  Have you checked your BP or HR? (document readings if available): 140/80 last night but has not checked it today  Are you experiencing any other symptoms? Fatigued, heart racing

## 2023-11-04 NOTE — Progress Notes (Signed)
 Cardiology Office Note   Date:  11/04/2023  ID:  CARMIE LANPHER, DOB 1940/06/13, MRN [id number] PCP: Glendia Shad, MD  [facility] Providers Cardiologist:  None   History of Present Illness Lori Guzman is a 83 y.o. female with a h/o Pafib, HTN, HLD, near syncope, GERD, DM2 who presents for PAF.  She was first evaluated by Dr. Gollan 02/05/23 for Afib.  She underwent DCCV 02/26/23. Echo showed LVEF 60-65%, normal RV size and function, severe LAE, mild to mod MR.   She was last seen 06/17/23 and was in NSR.   The patient reports she was fine until last week.  On Friday she worked all day and she felt very tired and was sweating quite a bit. She she feels she was a little dehydrated. Her watch notified her that she has been in A-fib 40% of the time since Friday (prior A-fib at 2%). She feels her heart racing. She denies SOB. She did have mild chest pain once on Saturday. She just feels tired. No further falls.  She reports compliance with Eliquis .  Studies Reviewed EKG Interpretation Date/Time:  Monday November 04 2023 14:03:50 EDT Ventricular Rate:  111 PR Interval:    QRS Duration:  82 QT Interval:  330 QTC Calculation: 448 R Axis:   51  Text Interpretation: Atrial fibrillation with rapid ventricular response Low voltage QRS Nonspecific ST and T wave abnormality When compared with ECG of 17-Jun-2023 14:23, Atrial fibrillation has replaced Sinus rhythm Vent. rate has increased BY  51 BPM Non-specific change in ST segment in Anterior leads Nonspecific T wave abnormality, worse in Inferior leads Nonspecific T wave abnormality now evident in Lateral leads Confirmed by Franchester, Shericka Johnstone ([id number]) on 11/04/2023 2:36:46 PM    Echo 2024 1. Left ventricular ejection fraction, by estimation, is 60 to 65%. The  left ventricle has normal function. The left ventricle has no regional  wall motion abnormalities. Left ventricular diastolic parameters are  indeterminate.   2. Right  ventricular systolic function is normal. The right ventricular  size is normal. There is moderately elevated pulmonary artery systolic  pressure. The estimated right ventricular systolic pressure is 41.0 mmHg.   3. Left atrial size was severely dilated.   4. The mitral valve is normal in structure. Mild to moderate mitral valve  regurgitation.   5. The aortic valve was not well visualized. Aortic valve regurgitation  is not visualized.   6. The inferior vena cava is dilated in size with <50% respiratory  variability, suggesting right atrial pressure of 15 mmHg.       Physical Exam VS:  BP 120/82 (BP Location: Left Arm, Patient Position: Sitting, Cuff Size: Normal)   Pulse (!) 111   Wt 176 lb (79.8 kg)   LMP 02/29/1992   SpO2 96%   BMI 30.21 kg/m        Wt Readings from Last 3 Encounters:  11/04/23 176 lb (79.8 kg)  08/23/23 177 lb 12.8 oz (80.6 kg)  08/13/23 179 lb 6.4 oz (81.4 kg)    GEN: Well nourished, well developed in no acute distress NECK: No JVD; No carotid bruits CARDIAC: Irreg IRreg, no murmurs, rubs, gallops RESPIRATORY:  Clear to auscultation without rales, wheezing or rhonchi  ABDOMEN: Soft, non-tender, non-distended EXTREMITIES:  No edema; No deformity   ASSESSMENT AND PLAN  Persistent Afib 4 days ago the patient was working outside when she started feeling her heart racing. Her watch told her she went from being in afib  2% of the time to 40% of the time. She is in Afib on EKG with HR of 111bpm. She feels her heart racing, SOB and generalized fatigue. She had one episode of chest pain. She had DCCV in January, and she is not wanting to undergo this again if necessary. I will start amiodarone  400mg BID x 1 week, 200mg  BID x 1 week, then 200mg  daily thereafter. I will refer to EP to discuss ablation.  Can consider repeat echo when she is established normal sinus rhythm/rates are better.  Leg edema She reports resolution of lower leg edema. She takes lasix  as needed.    HTN BP is good today, continue Toprol  and Telmisartan .   DM2 Most recent A1c 6.8.  This is monitored by PCP.        Dispo: Follow-up in 3 weeks  Signed, Lizania Bouchard VEAR Fishman, PA-C

## 2023-11-06 NOTE — Progress Notes (Signed)
 Lori Guzman                                          MRN: [id number]   11/06/2023   The [facility] Quality Team Specialist reviewed this patient medical record for the purposes of chart review for care gap closure. The following were reviewed: abstraction for care gap closure-kidney health evaluation for diabetes:eGFR  and uACR.    [facility]I Quality Team

## 2023-11-25 ENCOUNTER — Ambulatory Visit: Admitting: Pulmonary Disease

## 2023-11-25 ENCOUNTER — Encounter: Payer: Self-pay | Admitting: Internal Medicine

## 2023-11-25 ENCOUNTER — Encounter: Payer: Self-pay | Admitting: Pulmonary Disease

## 2023-11-25 VITALS — BP 136/66 | HR 92 | Temp 97.7°F | Ht 64.0 in | Wt 178.2 lb

## 2023-11-25 DIAGNOSIS — J8410 Pulmonary fibrosis, unspecified: Secondary | ICD-10-CM | POA: Diagnosis not present

## 2023-11-25 DIAGNOSIS — J4530 Mild persistent asthma, uncomplicated: Secondary | ICD-10-CM | POA: Diagnosis not present

## 2023-11-25 DIAGNOSIS — J45909 Unspecified asthma, uncomplicated: Secondary | ICD-10-CM | POA: Insufficient documentation

## 2023-11-25 DIAGNOSIS — I480 Paroxysmal atrial fibrillation: Secondary | ICD-10-CM

## 2023-11-25 LAB — NITRIC OXIDE: Nitric Oxide: 30

## 2023-11-25 MED ORDER — LEVALBUTEROL TARTRATE 45 MCG/ACT IN AERO: 2.0000 | INHALATION_SPRAY | RESPIRATORY_TRACT | 2 refills | Status: AC | PRN

## 2023-11-25 NOTE — Patient Instructions (Signed)
 VISIT SUMMARY:  During your visit, we discussed your persistent asthma, allergic rhinitis, and a recent episode of atrial fibrillation. We reviewed your current medications and made some adjustments to help manage your symptoms more effectively.  YOUR PLAN:  -PERSISTENT ASTHMA: Persistent asthma is a long-term condition where your airways are inflamed and narrowed, making it difficult to breathe. Your symptoms have improved with the use of Arnuity, and you no longer experience a gritty taste. We will resend the prescription for your rescue inhaler (albuterol/Xopenex ) to the pharmacy. We will also check the level of inflammation in your airway and schedule a follow-up in three months to monitor your asthma control.  -ALLERGIC RHINITIS: Allergic rhinitis is an allergic reaction that causes sneezing, congestion, and a runny nose. Your symptoms are likely worsened by the dry weather and ragweed exposure. You can start taking Claritin to help manage these symptoms.  -ATRIAL FIBRILLATION: Atrial fibrillation is an irregular and often rapid heart rate that can lead to poor blood flow. You experienced an episode recently, likely due to dehydration and physical exertion. You were treated with medication for this condition.  INSTRUCTIONS:  Please ensure you pick up your rescue inhaler from the pharmacy once the prescription is resent. Start taking Claritin for your allergy symptoms. Schedule a follow-up appointment in three months to monitor your asthma control. Additionally, remember to get your flu shot in two weeks.

## 2023-11-25 NOTE — Progress Notes (Signed)
 Subjective:    Patient ID: Lori Guzman, female    DOB: 1940-04-21, 83 y.o.   MRN: [id number]  Patient Care Team: Glendia Shad, MD as PCP - General (Internal Medicine)  Chief Complaint  Patient presents with   Asthma    No breathing problems. Some problems with allergies, due to weather change.    BACKGROUND/INTERVAL:  HPI Discussed the use of AI scribe software for clinical note transcription with the patient, who gave verbal consent to proceed.  History of Present Illness   Lori Guzman is an 83 year old female with persistent asthma who presents for follow-up.  She experiences shortness of breath that has been improving with the use of her current inhaler, Arnuity, taken daily. The previous inhaler caused a gritty taste in her mouth, which is not an issue with the current one. She has not received her rescue inhaler, Xopenex , from the pharmacy despite it being prescribed.  She experiences nasal drainage, likely due to ragweed allergies, and has been using a nasal spray. She inquires about using Claritin for her allergies, which she has not taken before due to concerns about interactions with her other medications.  She experienced an episode of atrial fibrillation on September 20th after exerting herself by cutting shrubs in hot weather, which she attributes to dehydration. She was prescribed a medication to take two in the morning and two at night, but the specific medication is not named, further investigation noted that this was amiodarone .  She has not yet received her flu shot but plans to get it in two weeks at her primary physician's office.      Review of Systems A 10 point review of systems was performed and it is as noted above otherwise negative.   Patient Active Problem List   Diagnosis Date Noted   Dysuria 08/13/2023   UTI (urinary tract infection) 05/06/2023   DOE (dyspnea on exertion) 04/02/2023   Wheezing 03/24/2023   SOB (shortness of  breath) 02/26/2023   Atrial fibrillation (HCC) 11/24/2022   Irregular heart beat 11/23/2022   Hyperbilirubinemia 11/23/2022   Post-nasal drainage 07/29/2022   Abnormal CXR 07/24/2022   Acute cough 07/19/2022   Pneumonia 01/14/2022   Chronic bronchitis (HCC) 12/29/2021   Itching 11/05/2021   Otitis media 10/13/2021   Rash 03/19/2021   Muscle ache 03/16/2021   Right knee pain 09/27/2020   Sinus congestion 08/15/2020   Thumb pain, left 03/27/2020   Lower abdominal pain 03/03/2017   Dizziness 02/03/2016   Neck pain 01/15/2016   Fatigue 08/22/2015   Near syncope 08/22/2015   Light headedness 08/10/2014   BMI 31.0-31.9,adult 04/05/2014   Health care maintenance 04/05/2014   Back pain 12/06/2013   Hypercholesterolemia 12/06/2013   Gum lesion 07/19/2013   Stress 01/21/2013   Diabetes (HCC) 08/15/2012   GERD (gastroesophageal reflux disease) 06/09/2012   History of colonic polyps 06/09/2012   Hypertension 12/17/2011    Social History   Tobacco Use   Smoking status: Never    Passive exposure: Past   Smokeless tobacco: Never  Substance Use Topics   Alcohol use: No    Alcohol/week: 0.0 standard drinks of alcohol    Allergies  Allergen Reactions   Other Itching and Dermatitis    Seasonal allergies    Current Meds  Medication Sig   apixaban  (ELIQUIS ) 5 MG TABS tablet TAKE 1 TABLET BY MOUTH TWICE A DAY   escitalopram  (LEXAPRO ) 20 MG tablet TAKE 1 TABLET BY MOUTH EVERY DAY  fluticasone (FLONASE) 50 MCG/ACT nasal spray Place 1 spray into both nostrils daily as needed (Congestion).   Fluticasone Furoate  (ARNUITY ELLIPTA ) 100 MCG/ACT AEPB Inhale 1 puff into the lungs daily.   metoprolol  succinate (TOPROL -XL) 25 MG 24 hr tablet Take 1 tablet (25 mg total) by mouth 2 (two) times daily.   Multiple Vitamins-Minerals (ICAPS AREDS FORMULA PO) Take 1 tablet by mouth daily.   Polyethyl Glycol-Propyl Glycol (SYSTANE) 0.4-0.3 % SOLN Place 1 drop into both eyes daily as needed (Dry  eye).   pravastatin  (PRAVACHOL ) 10 MG tablet TAKE ONE TABLET EVERY MONDAY, WEDNESDAY AND FRIDAY   Probiotic Product (PROBIOTIC PO) Take 1 capsule by mouth daily.   telmisartan  (MICARDIS ) 80 MG tablet Take 1 tablet (80 mg total) by mouth daily.   [DISCONTINUED] levalbuterol  (XOPENEX  HFA) 45 MCG/ACT inhaler Inhale 2 puffs into the lungs every 4 (four) hours as needed for wheezing.    Immunization History  Administered Date(s) Administered   Fluad Quad(high Dose 65+) 01/13/2020, 10/30/2021   INFLUENZA, HIGH DOSE SEASONAL PF 12/05/2016, 12/29/2018   Influenza Split 02/29/2012   Influenza,inj,Quad PF,6+ Mos 01/19/2013, 11/30/2013, 12/09/2014, 11/08/2017   Influenza-Unspecified 11/15/2015, 11/12/2020, 11/23/2022   PFIZER(Purple Top)SARS-COV-2 Vaccination 04/01/2019, 04/27/2019   Pneumococcal Conjugate-13 08/09/2014   Pneumococcal Polysaccharide-23 06/09/2012        Objective:     BP 136/66   Pulse 92   Temp 97.7 F (36.5 C) (Temporal)   Ht 5' 4 (1.626 m)   Wt 178 lb 3.2 oz (80.8 kg)   LMP 02/29/1992   SpO2 98%   BMI 30.59 kg/m   SpO2: 98 %  GENERAL: Overweight woman, no acute distress, fully ambulatory, no conversational dyspnea. HEAD: Normocephalic, atraumatic.  EYES: Pupils equal, round, reactive to light.  No scleral icterus.  MOUTH: Dentition intact, oral mucosa moist.  No thrush. NECK: Supple. No thyromegaly. Trachea midline. No JVD.  No adenopathy. PULMONARY: Good air entry bilaterally.  Coarse, otherwise, no adventitious sounds. CARDIOVASCULAR: S1 and S2. Regular rate and rhythm.  Grade 2/6 mitral regurgitation murmur. ABDOMEN: Obese, otherwise benign. MUSCULOSKELETAL: No joint deformity, no clubbing, no edema.  NEUROLOGIC: No overt focal deficit, no gait disturbance, speech is fluent. SKIN: Intact,warm,dry. PSYCH: Mood and behavior normal.  Lab Results  Component Value Date   NITRICOXIDE 30 11/25/2023  *Intermediate type II inflammation present *Trend: 31>>30  ppb       Assessment & Plan:     ICD-10-CM   1. 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 Paroxysmal atrial fibrillation (HCC)  I48.0       Orders Placed This Encounter  Procedures   Nitric oxide     Meds ordered this encounter  Medications   levalbuterol  (XOPENEX  HFA) 45 MCG/ACT inhaler    Sig: Inhale 2 puffs into the lungs every 4 (four) hours as needed for wheezing.    Dispense:  1 each    Refill:  2   Discussion:    Persistent asthma Persistent asthma with improvement in symptoms. She reports better breathing with Arnuity and no longer experiences a gritty taste. The rescue inhaler (albuterol/Xopenex ) was not received from the pharmacy, likely due to a prescription issue. There is slight airway inflammation, but it is improving compared to the last visit. - Send prescription for albuterol/Xopenex  to the pharmacy again - Nitric oxide  30 ppb - Schedule follow-up in three months to monitor asthma control  Allergic rhinitis Symptoms  likely exacerbated by current dry weather and possible ragweed exposure. She has not been using Claritin due to concerns about interactions with other medications. - Start Claritin for allergic rhinitis symptoms  Atrial fibrillation Recent episode on September 20th, likely triggered by dehydration and physical exertion. She was treated with medication (amiodarone ).     Advised if symptoms do not improve or worsen, to please contact office for sooner follow up or seek emergency care.    I spent 30 minutes of dedicated to the care of this patient on the date of this encounter to include pre-visit review of records, face-to-face time with the patient discussing conditions above, post visit ordering of testing, clinical documentation with the electronic health record, making appropriate referrals as documented, and communicating necessary findings to members of  the patients care team.     C. Leita Sanders, MD Advanced Bronchoscopy PCCM [facility]    *This note was generated using voice recognition software/Dragon and/or AI transcription program.  Despite best efforts to proofread, errors can occur which can change the meaning. Any transcriptional errors that result from this process are unintentional and may not be fully corrected at the time of dictation.

## 2023-11-26 ENCOUNTER — Encounter: Payer: Self-pay | Admitting: Medical

## 2023-11-26 ENCOUNTER — Ambulatory Visit: Attending: Medical | Admitting: Medical

## 2023-11-26 ENCOUNTER — Telehealth: Payer: Self-pay

## 2023-11-26 VITALS — BP 146/82 | HR 88 | Ht 64.0 in | Wt 179.8 lb

## 2023-11-26 DIAGNOSIS — I4819 Other persistent atrial fibrillation: Secondary | ICD-10-CM

## 2023-11-26 DIAGNOSIS — R600 Localized edema: Secondary | ICD-10-CM

## 2023-11-26 DIAGNOSIS — I10 Essential (primary) hypertension: Secondary | ICD-10-CM

## 2023-11-26 MED ORDER — AMIODARONE HCL 200 MG PO TABS: 200.0000 mg | ORAL_TABLET | Freq: Every day | ORAL | 3 refills | Status: AC

## 2023-11-26 MED ORDER — AMIODARONE HCL 200 MG PO TABS: 200.0000 mg | ORAL_TABLET | Freq: Two times a day (BID) | ORAL | 0 refills | Status: DC

## 2023-11-26 NOTE — Patient Instructions (Signed)
 Medication Instructions:   Please start Amiodarone  200 MG twice daily for 14 days.   Then decrease Amiodarone  to 200 MG once daily.  *If you need a refill on your cardiac medications before your next appointment, please call your pharmacy*  Lab Work:  No labs ordered today   If you have labs (blood work) drawn today and your tests are completely normal, you will receive your results only by: MyChart Message (if you have MyChart) OR A paper copy in the mail If you have any lab test that is abnormal or we need to change your treatment, we will call you to review the results.  Testing/Procedures:  No test ordered today   Follow-Up: At [facility], you and your health needs are our priority.  As part of our continuing mission to provide you with exceptional heart care, our providers are all part of one team.  This team includes your primary Cardiologist (physician) and Advanced Practice Providers or APPs (Physician Assistants and Nurse Practitioners) who all work together to provide you with the care you need, when you need it.  Your next appointment:    Please follow up with Dr. Gollan in February/ March  Provider:   Timothy Gollan, MD    We recommend signing up for the patient portal called MyChart.  Sign up information is provided on this After Visit Summary.  MyChart is used to connect with patients for Virtual Visits (Telemedicine).  Patients are able to view lab/test results, encounter notes, upcoming appointments, etc.  Non-urgent messages can be sent to your provider as well.   To learn more about what you can do with MyChart, go to [email or web address].

## 2023-11-26 NOTE — Telephone Encounter (Signed)
 [facility]ative requested:  Not Covered: Levalbuterol  HFA 45mcg inh.   Suggested alternatives: Ventolin HFA 90 mcg, Albuterol HFA 90 mcg  Please advise.

## 2023-11-26 NOTE — Telephone Encounter (Signed)
 Patient has atrial fibrillation.  She does not tolerate albuterol due to this triggering atrial fibs.  She does tolerate levo albuterol without issue.  Will need prior Auth.

## 2023-11-26 NOTE — Progress Notes (Signed)
 Cardiology Office Note   Date:  11/26/2023  ID:  Lori Guzman, DOB 1940/03/01, MRN [id number] PCP: Glendia Shad, MD  [facility] Providers Cardiologist:  None   History of Present Illness Lori Guzman is a 83 y.o. female with a h/o Pafib, HTN, HLD, near syncope, GERD, DM2 who presents for follow-up of PAF.   She was first evaluated by Dr. Gollan 02/05/23 for Afib.  She underwent DCCV 02/26/23. Echo showed LVEF 60-65%, normal RV size and function, severe LAE, mild to mod MR.   The patient was last seen 11/04/23 reporting more frequent Afib. EKG showed  Afib with HR 111bpm. She did not want to pursue cardioversion so she was started on amiodarone .   Today, the patient reports she stopped amiodarone  last week since she ran out of pills.  She did not realize she needed to continue taking them. EKG shows Afib/flutter with controlled heart rates in the 80s. She denies chest pain or SOB. Energy overall seems to be low when she is in Afib. She has appointment with EP next month. She reports compliance with Eliquis .   Studies Reviewed EKG Interpretation Date/Time:  Tuesday November 26 2023 13:54:44 EDT Ventricular Rate:  88 PR Interval:    QRS Duration:  82 QT Interval:  366 QTC Calculation: 442 R Axis:   36  Text Interpretation: Atrial fibrillation Low voltage QRS Nonspecific T wave abnormality When compared with ECG of 04-Nov-2023 14:03, Nonspecific T wave abnormality, worse in Lateral leads Confirmed by Franchester, Keniyah Gelinas ([id number]) on 11/26/2023 2:35:20 PM     Echo 2024 1. Left ventricular ejection fraction, by estimation, is 60 to 65%. The  left ventricle has normal function. The left ventricle has no regional  wall motion abnormalities. Left ventricular diastolic parameters are  indeterminate.   2. Right ventricular systolic function is normal. The right ventricular  size is normal. There is moderately elevated pulmonary artery systolic  pressure. The estimated right  ventricular systolic pressure is 41.0 mmHg.   3. Left atrial size was severely dilated.   4. The mitral valve is normal in structure. Mild to moderate mitral valve  regurgitation.   5. The aortic valve was not well visualized. Aortic valve regurgitation  is not visualized.   6. The inferior vena cava is dilated in size with <50% respiratory  variability, suggesting right atrial pressure of 15 mmHg.         Physical Exam VS:  BP (!) 146/82   Pulse 88   Ht 5' 4 (1.626 m)   Wt 179 lb 12.8 oz (81.6 kg)   LMP 02/29/1992   SpO2 97%   BMI 30.86 kg/m        Wt Readings from Last 3 Encounters:  11/26/23 179 lb 12.8 oz (81.6 kg)  11/25/23 178 lb 3.2 oz (80.8 kg)  11/04/23 176 lb (79.8 kg)    GEN: Well nourished, well developed in no acute distress NECK: No JVD; No carotid bruits CARDIAC: Irreg Irreg, no murmurs, rubs, gallops RESPIRATORY:  Clear to auscultation without rales, wheezing or rhonchi  ABDOMEN: Soft, non-tender, non-distended EXTREMITIES:  No edema; No deformity   ASSESSMENT AND PLAN  Persistent Afib/flutter The patient underwent DCCV 02/2023. At the last visit, 9/22, she was found to be back in Afib HR 111bpm, and she was started on amiodarone . She is wanting to avoid repeat cardioversion if possible. Today, the patient remains in rate controlled Afib/flutter. She says she stopped amiodarone  last week since as she ran out  of pills and didn't realize she was supposed to continue taking it. EKG shows Afib/flutter with HR in the 80s. She had stomach issues on high does of amiodarone . I will re-start amiodarone  200mg  BID x 14 days and then go down to 200mg  daily. Echo from 2024 showed dilated left atrium. She may require repeat cardioversion on amiodarone . She has appointment with EP Nov 11. Continue Eliquis  5mg  BID for stroke ppx and Toprol  25mg  BID for rate control.  Leg edema This has resolved. She takes lasix  as needed  HTN BP is mildly elevated today. We will continue  Toprol  and telmisartan  for now, and re-check BP at follow-up.        Dispo: Follow-up in 1 month with EP and 4 months with general cards  Signed, Paxon Propes VEAR Fishman, PA-C

## 2023-11-27 ENCOUNTER — Other Ambulatory Visit (HOSPITAL_COMMUNITY): Payer: Self-pay

## 2023-11-27 ENCOUNTER — Telehealth: Payer: Self-pay

## 2023-11-27 NOTE — Telephone Encounter (Signed)
*  Pulm  Pharmacy Patient Advocate Encounter   Received notification from Pt Calls Messages that prior authorization for Levalbuterol  Tartrate 45MCG/ACT aerosol   is required/requested.   Insurance verification completed.   The patient is insured through [employer].   Per test claim: PA required; PA submitted to above mentioned insurance via Latent Key/confirmation #/EOC [id number] Status is pending

## 2023-11-27 NOTE — Telephone Encounter (Signed)
 Pharmacy Patient Advocate Encounter  Received notification from [employer] that Prior Authorization for Levalbuterol  HFA has been APPROVED from 11/27/2023 to 02/11/2025

## 2023-11-27 NOTE — Telephone Encounter (Signed)
 Per the pharmacy team- Pharmacy Patient Advocate Encounter   Received notification from [employer] that Prior Authorization for Levalbuterol  HFA has been APPROVED from 11/27/2023 to 02/11/2025     [facility] [place].  Nothing further needed.

## 2023-11-27 NOTE — Telephone Encounter (Signed)
 Noted. [facility] is aware.  Nothing further needed.

## 2023-12-01 ENCOUNTER — Other Ambulatory Visit: Payer: Self-pay | Admitting: Internal Medicine

## 2023-12-08 ENCOUNTER — Other Ambulatory Visit: Payer: Self-pay | Admitting: Internal Medicine

## 2023-12-11 ENCOUNTER — Institutional Professional Consult (permissible substitution): Admitting: Cardiology

## 2023-12-13 ENCOUNTER — Other Ambulatory Visit: Payer: Self-pay | Admitting: *Deleted

## 2023-12-13 DIAGNOSIS — E1165 Type 2 diabetes mellitus with hyperglycemia: Secondary | ICD-10-CM

## 2023-12-13 DIAGNOSIS — R300 Dysuria: Secondary | ICD-10-CM

## 2023-12-13 DIAGNOSIS — E7800 Pure hypercholesterolemia, unspecified: Secondary | ICD-10-CM

## 2023-12-13 DIAGNOSIS — I10 Essential (primary) hypertension: Secondary | ICD-10-CM

## 2023-12-14 DIAGNOSIS — S76312A Strain of muscle, fascia and tendon of the posterior muscle group at thigh level, left thigh, initial encounter: Secondary | ICD-10-CM | POA: Diagnosis not present

## 2023-12-14 DIAGNOSIS — S79921A Unspecified injury of right thigh, initial encounter: Secondary | ICD-10-CM | POA: Diagnosis not present

## 2023-12-14 DIAGNOSIS — S76311A Strain of muscle, fascia and tendon of the posterior muscle group at thigh level, right thigh, initial encounter: Secondary | ICD-10-CM | POA: Diagnosis not present

## 2023-12-14 DIAGNOSIS — R55 Syncope and collapse: Secondary | ICD-10-CM | POA: Diagnosis not present

## 2023-12-14 DIAGNOSIS — I4819 Other persistent atrial fibrillation: Secondary | ICD-10-CM | POA: Diagnosis not present

## 2023-12-14 DIAGNOSIS — I10 Essential (primary) hypertension: Secondary | ICD-10-CM | POA: Diagnosis not present

## 2023-12-14 DIAGNOSIS — R609 Edema, unspecified: Secondary | ICD-10-CM | POA: Diagnosis not present

## 2023-12-14 DIAGNOSIS — J45909 Unspecified asthma, uncomplicated: Secondary | ICD-10-CM | POA: Diagnosis not present

## 2023-12-14 DIAGNOSIS — S76011A Strain of muscle, fascia and tendon of right hip, initial encounter: Secondary | ICD-10-CM | POA: Diagnosis not present

## 2023-12-14 DIAGNOSIS — R5381 Other malaise: Secondary | ICD-10-CM | POA: Diagnosis not present

## 2023-12-14 DIAGNOSIS — R1111 Vomiting without nausea: Secondary | ICD-10-CM | POA: Diagnosis not present

## 2023-12-14 DIAGNOSIS — M79604 Pain in right leg: Secondary | ICD-10-CM | POA: Diagnosis not present

## 2023-12-14 DIAGNOSIS — M6281 Muscle weakness (generalized): Secondary | ICD-10-CM | POA: Diagnosis not present

## 2023-12-14 DIAGNOSIS — R279 Unspecified lack of coordination: Secondary | ICD-10-CM | POA: Diagnosis not present

## 2023-12-14 DIAGNOSIS — M79651 Pain in right thigh: Secondary | ICD-10-CM | POA: Diagnosis not present

## 2023-12-14 DIAGNOSIS — Z7901 Long term (current) use of anticoagulants: Secondary | ICD-10-CM | POA: Diagnosis not present

## 2023-12-14 DIAGNOSIS — S7001XA Contusion of right hip, initial encounter: Secondary | ICD-10-CM | POA: Diagnosis not present

## 2023-12-14 DIAGNOSIS — E119 Type 2 diabetes mellitus without complications: Secondary | ICD-10-CM | POA: Diagnosis not present

## 2023-12-14 DIAGNOSIS — S7011XA Contusion of right thigh, initial encounter: Secondary | ICD-10-CM | POA: Diagnosis not present

## 2023-12-14 DIAGNOSIS — N179 Acute kidney failure, unspecified: Secondary | ICD-10-CM | POA: Diagnosis not present

## 2023-12-14 DIAGNOSIS — S76019A Strain of muscle, fascia and tendon of unspecified hip, initial encounter: Secondary | ICD-10-CM | POA: Diagnosis not present

## 2023-12-14 DIAGNOSIS — D62 Acute posthemorrhagic anemia: Secondary | ICD-10-CM | POA: Diagnosis not present

## 2023-12-14 DIAGNOSIS — W19XXXA Unspecified fall, initial encounter: Secondary | ICD-10-CM | POA: Diagnosis not present

## 2023-12-17 ENCOUNTER — Other Ambulatory Visit

## 2023-12-17 ENCOUNTER — Telehealth: Payer: Self-pay | Admitting: Medical

## 2023-12-17 NOTE — Telephone Encounter (Signed)
 Therisa, Hospitalist with [facility], calling to make office aware pt had a bad fall that ruptured her hamstring. She has a large hematoma covering the whole area. Therisa has given orders to hold Eliquis  for two weeks. She just wanted to make the office aware. Therisa ask Cadence Franchester give her a call if there are any advisories against this hold. Please advise.

## 2023-12-19 ENCOUNTER — Ambulatory Visit: Admitting: Internal Medicine

## 2023-12-19 NOTE — Telephone Encounter (Signed)
 Dr. Gollan made aware and noted he will contact physician.

## 2023-12-20 NOTE — Discharge Summary (Addendum)
 Physician Discharge Summary [facility]BR 1 BT2 [facility] [address] [place] [address] Dept: [phone] Loc: [phone]   Identifying Information:  Lori Guzman [birth year] [id number]  Primary Care Physician: Referring, Unknown Per Patient  Code Status: Full Code  Admit Date: 12/14/2023  Discharge Date: 12/20/2023   Discharge To: Home  Discharge Service: HBR - HBR: Hospitalist [facility]   Discharge Attending Physician: No att. providers found  Discharge Diagnoses: Principal Problem:   Hamstring tendon rupture, right, initial encounter (POA: Unknown) Active Problems:   Asthma (HHS-HCC) (POA: Yes)   Atrial fibrillation    (CMS-HCC) (POA: Yes)   Benign essential HTN (POA: Yes)   Diabetes (CMS-HCC) (POA: Yes)   Hypercholesterolemia (POA: Yes)   Fall (POA: Unknown)   Hematoma of right thigh (POA: Unknown)   Tear of gluteus maximus muscle (POA: Unknown)   Partial tear of left hamstring (POA: Unknown)   Blood loss anemia (POA: Unknown) Resolved Problems:   * No resolved hospital problems. *   Outpatient Provider Follow Up Issues:  [ ]  Repeat hemoglobin at follow-up [ ]  Holding apixaban  for 2 weeks after injury  Hospital Course:  Lori Guzman is an 83 y.o. female with a past medical history of asthma, atrial fibrillation, HTN, diabetes, and hypercholesterolemia presenting to [facility] after a fall resulting in a hamstring tendon rupture of the R leg.   Hamstring Tendon rupture, right, initial encounter  Fall  Hematoma of right thigh  Tear of gluteus maximus muscle  Patient presented to [facility] Emergency department after a fall on 11/1. She was doing yard work when she slipped and injured her R leg. MRI in the emergency department revealed full thickness rupture of the right hamstring at the origin, large associated hematoma within the right posterior hip, reactive neuritis of the sciatic nerve, moderate partial thickness tear of the deep fibers of the gluteus  maximus, and periosteal stripping of the adductor magnus without tendon retraction. Patient has a large visible hematoma on her posterior proximal thigh. Ortho was consulted and they recommended nonoperative management and WBAT. She worked with PT/OT and was originally recommended for SNF but later progressed to home health.  She was able to work several times with PT and OT during admission.  Pain control with Tylenol, lidocaine  patches, Voltaren gel.  Received some oxycodone during admission but had not needed it by discharge.  Also encouraged to put a compressive ACE wrap around the R upper extremity which helped her pain.  Ortho was notified to arrange outpatient follow-up for the patient.  She did hold her apixaban  for 2 weeks after the hematoma which was discussed with her.  Blood Loss Anemia Patient's Hgb decreased to mains in the 7 or 8 throughout admission. This is likely due to acute blood loss from her injury and hematoma. Iron studies revealed low iron saturation at 11% and iron level of 27.  Given 1500 mg of IV iron during admission.   Encouraged oral iron on discharge after constipation had resolved.  Atrial Fibrillation Patient has known recurrent atrial fibrillation. Continued home amiodarone  200mg  tablet. .  Eliquis  held on discharge for 2 weeks after the injury.  This was discussed with outpatient cardiologist.  Constipation-patient developed some constipation during admission and the setting of opioids, decreased mobility.  Was given scheduled bowel regimen.  Was able to pass gas and eat normally.  Discharge will continue scheduled bowel regimen as well as as needed lactulose and suppositories until having regular bowel movements.  Acute Kidney Injury-Creatinine on 11/3  rose to 1.89 from 0.99. This is likely from acute blood loss as well as decreased oral intake.  Resolved by discharge.  Benign Essential HTN-Home blood pressure medication held due to hypotension on admission.  Resumed  on discharge after blood pressure normalized.  Other issues managed per home regimen.  Procedures: No admission procedures for hospital encounter. ______________________________________________________________________ Discharge Medications:   Your Medication List     PAUSE taking these medications    ELIQUIS  5 mg Tab Wait to take this until: December 29, 2023 Generic drug: apixaban  Take 1 tablet (5 mg total) by mouth two (2) times a day.       START taking these medications    acetaminophen 500 MG tablet Commonly known as: TYLENOL Take 2 tablets (1,000 mg total) by mouth Three (3) times a day as directed.   bisacodyl 10 mg suppository Commonly known as: DULCOLAX Insert 1 suppository (10 mg total) into the rectum daily as needed.   diclofenac sodium 1 % gel Commonly known as: VOLTAREN Apply 2 grams topically to the affected area four (4) times a day as needed for arthritis.   lactulose 10 gram/15 mL solution Take 30 mL (20 grams total) by mouth daily as needed for up to 7 days.   LIDOCAINE  PAIN RELIEF 4 % patch Generic drug: lidocaine  Place 2 patches on the skin daily as needed (leg pain).   polyethylene glycol 17 gram/dose powder Commonly known as: GLYCOLAX Mix 1 capful (17 grams total) with 4 to 8oz of water, tea, coffee, or juice and drink by mouth two (2) times a day.   SENNA 8.6 mg tablet Generic drug: senna Take 2 tablets by mouth two (2) times a day.       CONTINUE taking these medications    amiodarone  200 MG tablet Commonly known as: PACERONE  Take 1 tablet (200 mg total) by mouth daily.   ARNUITY ELLIPTA  100 mcg/actuation inhaler Generic drug: fluticasone furoate  Inhale 1 puff daily.   escitalopram  oxalate 20 MG tablet Commonly known as: LEXAPRO  Take 1 tablet (20 mg total) by mouth daily.   fluticasone propionate 50 mcg/actuation nasal spray Commonly known as: FLONASE 1 spray into each nostril once as needed.   levalbuterol  45  mcg/actuation inhaler Commonly known as: XOPENEX  HFA Inhale 2 puffs every four (4) hours as needed.   metoPROLOL  succinate 25 MG 24 hr tablet Commonly known as: Toprol -XL Take 1 tablet (25 mg total) by mouth.   telmisartan  80 MG tablet Commonly known as: MICARDIS  Take 1 tablet (80 mg total) by mouth daily. TAKE 1 TABLET (80 MG TOTAL) BY MOUTH DAILY.        Allergies: Patient has no known allergies. ______________________________________________________________________ Pending Test Results (if blank, then none):   Most Recent Labs: All lab results last 24 hours -  Recent Results (from the past 24 hours)  CBC   Collection Time: 12/20/23  7:47 AM  Result Value Ref Range   WBC 8.6 3.6 - 11.2 10*9/L   RBC 2.31 (L) 3.95 - 5.13 10*12/L   HGB 7.4 (L) 11.3 - 14.9 g/dL   HCT 78.3 (L) 65.9 - 55.9 %   MCV 93.4 77.6 - 95.7 fL   MCH 32.1 25.9 - 32.4 pg   MCHC 34.4 32.0 - 36.0 g/dL   RDW 85.4 87.7 - 84.7 %   MPV 8.1 6.8 - 10.7 fL   Platelet 277 150 - 450 10*9/L  Basic Metabolic Panel   Collection Time: 12/20/23  7:47 AM  Result  Value Ref Range   Sodium 145 135 - 145 mmol/L   Potassium 4.3 3.4 - 4.8 mmol/L   Chloride 102 98 - 107 mmol/L   CO2 28.2 20.0 - 31.0 mmol/L   Anion Gap 15 (H) 5 - 14 mmol/L   BUN 11 9 - 23 mg/dL   Creatinine 9.16 9.44 - 1.02 mg/dL   BUN/Creatinine Ratio 13    eGFR CKD-EPI (2021) Female 70 >=60 mL/min/1.75m2   Glucose 124 70 - 179 mg/dL   Calcium  9.2 8.7 - 10.4 mg/dL    Relevant Studies/Radiology (if blank, then none): MRI Lower Extremity Joint Right Wo Contrast Result Date: 12/15/2023 EXAM: MRI LOWER EXTREMITY JOINT RIGHT WO CONTRAST DATE: 12/15/2023 7:34 AM ACCESSION: [id number] UN DICTATED: 12/15/2023 8:13 AM INTERPRETATION LOCATION: MAIN CAMPUS CLINICAL INDICATION: 83 years old Female with fall on to right side  -  concern for hamstring injury +/ -  occult hip fracture  COMPARISON: Right hip radiograph 12/15/2023 TECHNIQUE: MRI of the pelvis was  performed using a local coil.  Multisequence, multiplanar images were obtained without contrast.   Additional small field-of-view images of the right hip were obtained. FINDINGS:  Bones: No fractures. No marrow edema or bone lesions. Right hip: Mild thinning and chondral irregularity of the superior hip cartilage. No subchondral marrow edema. Diffuse acetabular labral degeneration with paralabral cysts extending from the superior to the posterior segments. Trace right hip effusion. Fluid decompressing from the hip joint along the iliopsoas bursa. Other Joints: Mild left hip osteoarthrosis. Lower lumbar spondylosis with disc desiccation and degenerative endplate changes at L5-S1. Mild bilateral SI joint degenerative changes. Pubic symphysis is approximated. Musculature and tendons: Full-thickness hamstring rupture at the origin with tendon avulsion and retraction. A few torn tendon fibers are visualized lateral to the ischial tuberosity with a 1.1 cm tendon gap (6:22). The majority of the free tendon is not visualized and likely retracted distally beyond the field-of-view. Large hematoma adjacent to the ischial tuberosity and splaying the gluteus maximus and adductor magnus muscle measuring approximately 7.4 x 7.4 cm. Periosteal stripping of the insertion of the adductor magnus without tendon retraction (6:22). Moderate partial-thickness tear tear and tendon retraction of the deep fibers of the gluteus maximus with a tendon gap measuring 2.5 cm (6:6). Gluteus minimus and medius are intact. The contralateral hamstring demonstrates tendinosis with moderate interstitial tearing at the hamstring origin. Left gluteus minimus and medius tendons are intact. The iliopsoas and biceps femoris tendons are intact. Pelvic Cavity: Fibroid uterus. Colonic diverticulosis. Other: Asymmetric enlargement and increased signal of the right sciatic nerve which courses peripheral to the large hematoma. Right peritrochanteric bursal fluid  distention with heterogeneous fluid, likely blood products. Subcutaneous edema throughout the right hip.   Right hip: Full-thickness rupture of right hamstring at the origin. The majority of the fibers are retracted inferiorly beyond the field-of-view. Large associated hematoma within the right posterior hip adjacent to the ischial tuberosity measuring up to 7.4 cm. The adjacent right sciatic nerve is enlarged and edematous, compatible with reactive neuritis. Moderate partial-thickness tear of the deep fibers of the gluteus maximus. Periosteal stripping of the adductor magnus without tendon retraction. Moderate interstitial tear with tendinosis of the left hamstring origin without tendon retraction. No acute fracture or dislocation.   XR Trauma Hip Right Result Date: 12/15/2023 EXAM: XR TRAUMA HIP RIGHT DATE: 12/15/2023 1:06 AM ACCESSION: [id number] UN DICTATED: 12/15/2023 1:48 AM INTERPRETATION LOCATION: Main Campus CLINICAL INDICATION: 83 years old Female with fall, evaluate fracture    COMPARISON: None. TECHNIQUE:  AP view of the pelvis. AP and cross table lateral views of the right femur.   1.  No acute fracture or acute traumatic malalignment of the right hip, femur, or pelvic ring. 2.  Osteopenia. 3.  Superior patellar enthesophytes. 4.  Surgical clip in the pelvis.   ______________________________________________________________________ Discharge Instructions:      Other Instructions     Discharge instructions     You were hospitalized after a fall with a tear of your hamstring, gluteus (a muscle in your bottom), as well as a hematoma which is a collection of blood.  The orthopedic surgery saw you and did not recommend surgery.  They will set up a follow-up appointment in their clinic.  You should stop taking your apixaban  until November 16.  I would suggest wrapping your leg with an Ace wrap when you walk, this will help with feeling that the area is more stable.  You can also take Tylenol (up  to 3000 mg or 3 g a day), Voltaren gel, lidocaine  patches to treat your pain.  I suggest setting up a follow-up with your primary care doctor in 1 to 2 weeks to ensure your blood counts are stable and that you are doing okay.  For the constipation, you should take MiraLAX and senna 2 times a day until you are having regular bowel movements.  If you still do not have a bowel movement despite this I have prescribed suppositories as well as lactulose that you could take until having regular bowel movements.  Lactulose may cause diarrhea so I be careful with taking it they are in a place where you could go to the bathroom quickly.  Thanks for letting me be part of your care team.  I am wishing you all the best. - Dr. Clem       Follow Up instructions and Outpatient Referrals    Ambulatory Referral to Home Health     Reason for referral: therapy   Physician to follow patient's care: PCP   Disciplines requested:  Physical Therapy Occupational Therapy Home Health Aide     Physical Therapy requested:  Home safety evaluation Evaluate and treat     Occupational Therapy Requested:  Home safety evaluation Evaluate and treat     Requested SOC Date:  Comment - [facility]   Discharge instructions       Appointments which have been scheduled for you    Dec 27, 2023 8:00 AM (Arrive by 7:45 AM) NEW ORTHOPAEDICS with Charmaine Cy Kief, PA [facility] [place] [facility]) [address] [place] [address] [phone]        ______________________________________________________________________ Discharge Day Services: BP 132/64   Pulse 100   Temp 36.8 C (98.2 F)   Resp 16   Ht 163 cm (5' 4.17)   Wt 80.7 kg (178 lb)   SpO2 98%   BMI 30.39 kg/m  Pt seen on the day of discharge and determined appropriate for discharge.  Condition at Discharge: stable  Length of Discharge: I spent greater than 30 mins in the  discharge of this patient.

## 2023-12-21 ENCOUNTER — Other Ambulatory Visit: Payer: Self-pay | Admitting: Internal Medicine

## 2023-12-21 ENCOUNTER — Encounter: Payer: Self-pay | Admitting: Internal Medicine

## 2023-12-23 ENCOUNTER — Telehealth: Payer: Self-pay

## 2023-12-23 ENCOUNTER — Ambulatory Visit: Admitting: Cardiovascular Disease

## 2023-12-23 NOTE — Telephone Encounter (Signed)
 Copied from CRM [id number]. Topic: Clinical - Home Health Verbal Orders >> Dec 23, 2023  4:23 PM Alexandria E wrote: Caller/Agency: Hadassah, PT with [facility] Callback Number: [phone] (secure line) Service Requested: Physical Therapy Frequency: 1x/week for 1 week, 2x/week for 3 weeks, and 1x/week for 5 weeks *Also requesting a home health aide for ADLs: 2x/week for 2 weeks and 1x/week for 5 weeks Any new concerns about the patient? No

## 2023-12-23 NOTE — Transitions of Care (Post Inpatient/ED Visit) (Signed)
   12/23/2023  Name: AHMIYA ABEE MRN: [id number] DOB: 1940/08/30  Today's TOC FU Call Status: Today's TOC FU Call Status:: Unsuccessful Call (1st Attempt) Unsuccessful Call (1st Attempt) Date: 12/23/23  Attempted to reach the patient regarding the most recent Inpatient/ED visit.  Follow Up Plan: Additional outreach attempts will be made to reach the patient to complete the Transitions of Care (Post Inpatient/ED visit) call.   Arvin Seip RN, BSN, CCM [employer], Population Health Case Manager Phone: [phone]

## 2023-12-24 ENCOUNTER — Telehealth: Payer: Self-pay

## 2023-12-24 ENCOUNTER — Institutional Professional Consult (permissible substitution): Admitting: Cardiology

## 2023-12-24 NOTE — Telephone Encounter (Signed)
 Noted

## 2023-12-24 NOTE — Telephone Encounter (Unsigned)
 Copied from CRM [id number]. Topic: General - Call Back - No Documentation >> Dec 24, 2023 10:24 AM Eva FALCON wrote: Reason for CRM: Hadassah, PT from [facility], returning call to Daijah, relayed the message in encounter that Dr. Glendia approved verbal order for PT. Lori Guzman understood.

## 2023-12-24 NOTE — Telephone Encounter (Signed)
 Lvm. Okay to relay. Per dr. Glendia verbal order for PT is fine

## 2023-12-24 NOTE — Transitions of Care (Post Inpatient/ED Visit) (Signed)
 12/24/2023  Name: Lori Guzman MRN: [id number] DOB: 1941-01-26  Today's TOC FU Call Status: Today's TOC FU Call Status:: Successful TOC FU Call Completed TOC FU Call Complete Date: 12/24/23  Patient's Name and Date of Birth confirmed. Name, DOB  Transition Care Management Follow-up Telephone Call Date of Discharge: 12/20/23 Discharge Facility: Other [facility]) Name of Other (Non[facility]) Discharge Facility: [facility] Type of Discharge: Inpatient Admission Primary Inpatient Discharge Diagnosis:: hamstring tenon rupture, right How have you been since you were released from the hospital?: Better (patient states she is improving however she has a long way to go.  Patient states she has swelling and bruising from her buttocks to ankle. she states she is using her walker to ambulate. Reports pain is much worse when walking.) Any questions or concerns?: No  Items Reviewed: Did you receive and understand the discharge instructions provided?: Yes Medications obtained,verified, and reconciled?: Yes (Medications Reviewed) Any new allergies since your discharge?: No Dietary orders reviewed?: Yes Type of Diet Ordered:: regular diet. Do you have support at home?: Yes People in Home [RPT]: other relative(s) Name of Support/Comfort Primary Source: Carina Chaplin ( daughter in law)  Medications Reviewed Today: Medications Reviewed Today     Reviewed by Thurma Priego E, RN (Registered Nurse) on 12/24/23 at 1401  Med List Status: <None>   Medication Order Taking? Sig Documenting Provider Last Dose Status Informant  acetaminophen (TYLENOL) 500 MG tablet [id number] Yes Take 500 mg by mouth every 6 (six) hours as needed. [provider]  Active   amiodarone  (PACERONE ) 200 MG tablet [id number]  Take 1 tablet (200 mg total) by mouth 2 (two) times daily for 14 days.  Patient not taking: Reported on 12/24/2023   Furth, Cadence H, PA-C  Active   amiodarone  (PACERONE ) 200  MG tablet [id number] Yes Take 1 tablet (200 mg total) by mouth daily. Furth, Cadence H, PA-C  Active   apixaban  (ELIQUIS ) 5 MG TABS tablet [id number]  TAKE 1 TABLET BY MOUTH TWICE A DAY  Patient not taking: Reported on 12/24/2023   Glendia Shad, MD  Active   bisacodyl (DULCOLAX) 10 MG suppository [id number] Yes Place 10 mg rectally as needed for moderate constipation. [provider]  Active   diclofenac Sodium (VOLTAREN) 1 % GEL [id number] Yes Apply 2 g topically 4 (four) times daily. [provider]  Active   escitalopram  (LEXAPRO ) 20 MG tablet [id number] Yes TAKE 1 TABLET BY MOUTH EVERY DAY Glendia Shad, MD  Active   fluticasone (FLONASE) 50 MCG/ACT nasal spray [id number] Yes Place 1 spray into both nostrils daily as needed (Congestion). [provider]  Active Self  Fluticasone Furoate  (ARNUITY ELLIPTA ) 100 MCG/ACT AEPB [id number] Yes Inhale 1 puff into the lungs daily. Tamea Dedra CROME, MD  Active   lactulose [facility]) 10 GM/15ML solution [id number] Yes Take 10 g by mouth 3 (three) times daily. [provider]  Active   levalbuterol  (XOPENEX  HFA) 45 MCG/ACT inhaler [id number] Yes Inhale 2 puffs into the lungs every 4 (four) hours as needed for wheezing. Tamea Dedra CROME, MD  Active   lidocaine  (LIDODERM ) 5 % [id number] Yes Place 1 patch onto the skin daily. 4 % patch. Place 2 patches on skin daily as needed ( leg Pain) per [facility] center discharge instructions. Patient stated per her discharge instructions [provider]  Active   metoprolol  succinate (TOPROL -XL) 25 MG 24 hr tablet [id number] Yes Take 1 tablet (25 mg total) by mouth 2 (two) times daily.  Patient taking differently: Take 25 mg by mouth 2 (two) times daily. Patient states she takes 1 tablet daily   Wittenborn, Deborah, NP  Active   Multiple Vitamins-Minerals (ICAPS AREDS FORMULA PO) [id number] Yes Take 1 tablet by mouth daily. [provider]  Active Self  Polyethyl  Glycol-Propyl Glycol (SYSTANE) 0.4-0.3 % SOLN [id number] Yes Place 1 drop into both eyes daily as needed (Dry eye). [provider]  Active Self  pravastatin  (PRAVACHOL ) 10 MG tablet [id number] Yes TAKE ONE TABLET EVERY MONDAY, WEDNESDAY AND FRIDAY Scott, Charlene, MD  Active   Probiotic Product (PROBIOTIC PO) [id number] Yes Take 1 capsule by mouth daily. [provider]  Active Self  senna (SENOKOT) 8.6 MG TABS tablet [id number] Yes Take 2 tablets by mouth daily. [provider]  Active   telmisartan  (MICARDIS ) 80 MG tablet [id number] Yes Take 1 tablet (80 mg total) by mouth daily. Glendia Shad, MD  Active Self            Home Care and Equipment/Supplies: Were Home Health Services Ordered?: Yes Name of Home Health Agency:: [facility] Has Agency set up a time to come to your home?: Yes First Home Health Visit Date: 12/23/23 Any new equipment or medical supplies ordered?: Yes Name of Medical supply agency?: unsure of name per patient. Patient received walker/ 3 in 1. Were you able to get the equipment/medical supplies?: Yes Do you have any questions related to the use of the equipment/supplies?: No  Functional Questionnaire: Do you need assistance with bathing/showering or dressing?: Yes Do you need assistance with meal preparation?: Yes Do you need assistance with eating?: No Do you have difficulty maintaining continence: No Do you need assistance with getting out of bed/getting out of a chair/moving?: No Do you have difficulty managing or taking your medications?: Yes (patient states daughter in law fills pill box)  Follow up appointments reviewed: PCP Follow-up appointment confirmed?: Yes Date of PCP follow-up appointment?: 12/26/23 Follow-up Provider: Dr.Charlene [facility] Follow-up appointment confirmed?: Yes Date of Specialist follow-up appointment?: 12/27/23 Follow-Up Specialty Provider:: Charmaine Cy Kief, PA Do you  need transportation to your follow-up appointment?: No Do you understand care options if your condition(s) worsen?: Yes-patient verbalized understanding  SDOH Interventions Today    Flowsheet Row Most Recent Value  SDOH Interventions   Food Insecurity Interventions Intervention Not Indicated  Housing Interventions Intervention Not Indicated  Transportation Interventions Intervention Not Indicated  Utilities Interventions Intervention Not Indicated   Discussed and offered 30 day TOC program.  Patient  declined.  The patient has been provided with contact information for the care management team and has been advised to call with any health -related questions or concerns.  The patient verbalized understanding with current plan of care.  The patient is directed to their insurance card regarding availability of benefits coverage.    Arvin Seip RN, BSN, CCM [employer], Population Health Case Manager Phone: [phone]

## 2023-12-24 NOTE — Patient Instructions (Signed)
 Visit Information  Thank you for taking time to visit with me today. Please don't hesitate to contact me if I can be of assistance to you.  Patient instructions:  Keep follow up appointments with providers. Per discharge instructions consider wrapping your right leg with an ace wrap when you walk.  Take medications as prescribed.  Notify provider for worsening pain unrelieved by prescribed pain medication. Reschedule appointment with cardiologist.  Notify provider for new/ ongoing symptoms. Call 911 for severe shortness of breath and/ or chest pain   Patient verbalizes understanding of instructions and care plan provided today and agrees to view in MyChart. Active MyChart status and patient understanding of how to access instructions and care plan via MyChart confirmed with patient.     The patient has been provided with contact information for the care management team and has been advised to call with any health related questions or concerns.   Please call the care guide team at [phone] if you need to cancel or reschedule your appointment.   Please call the Suicide and Crisis Lifeline: 988 call the [place]  National Suicide Prevention Lifeline: (405)585-4452 or TTY: (747)158-8053 TTY (705)152-1668) to talk to a trained counselor call 1-800-273-TALK (toll free, 24 hour hotline) if you are experiencing a Mental Health or Behavioral Health Crisis or need someone to talk to.  Arvin Seip RN, BSN, CCM [employer], Population Health Case Manager Phone: [phone]

## 2023-12-24 NOTE — Telephone Encounter (Signed)
 Ok

## 2023-12-26 ENCOUNTER — Encounter: Payer: Self-pay | Admitting: Internal Medicine

## 2023-12-26 ENCOUNTER — Ambulatory Visit: Admitting: Internal Medicine

## 2023-12-26 VITALS — BP 136/78 | HR 103 | Temp 98.5°F | Ht 64.0 in | Wt 186.0 lb

## 2023-12-26 DIAGNOSIS — Z23 Encounter for immunization: Secondary | ICD-10-CM

## 2023-12-26 DIAGNOSIS — F439 Reaction to severe stress, unspecified: Secondary | ICD-10-CM

## 2023-12-26 DIAGNOSIS — D649 Anemia, unspecified: Secondary | ICD-10-CM

## 2023-12-26 DIAGNOSIS — R350 Frequency of micturition: Secondary | ICD-10-CM | POA: Diagnosis not present

## 2023-12-26 DIAGNOSIS — J4520 Mild intermittent asthma, uncomplicated: Secondary | ICD-10-CM

## 2023-12-26 DIAGNOSIS — E7800 Pure hypercholesterolemia, unspecified: Secondary | ICD-10-CM

## 2023-12-26 DIAGNOSIS — E1165 Type 2 diabetes mellitus with hyperglycemia: Secondary | ICD-10-CM

## 2023-12-26 DIAGNOSIS — I10 Essential (primary) hypertension: Secondary | ICD-10-CM | POA: Diagnosis not present

## 2023-12-26 DIAGNOSIS — I4819 Other persistent atrial fibrillation: Secondary | ICD-10-CM

## 2023-12-26 LAB — CBC WITH DIFFERENTIAL/PLATELET
Basophils Absolute: 0.1 K/uL (ref 0.0–0.1)
Basophils Relative: 0.6 % (ref 0.0–3.0)
Eosinophils Absolute: 0.1 K/uL (ref 0.0–0.7)
Eosinophils Relative: 1.6 % (ref 0.0–5.0)
HCT: 25.6 % — ABNORMAL LOW (ref 36.0–46.0)
Hemoglobin: 8.5 g/dL — ABNORMAL LOW (ref 12.0–15.0)
Lymphocytes Relative: 13.8 % (ref 12.0–46.0)
Lymphs Abs: 1.2 K/uL (ref 0.7–4.0)
MCHC: 33.2 g/dL (ref 30.0–36.0)
MCV: 98.3 fl (ref 78.0–100.0)
Monocytes Absolute: 0.8 K/uL (ref 0.1–1.0)
Monocytes Relative: 8.9 % (ref 3.0–12.0)
Neutro Abs: 6.6 K/uL (ref 1.4–7.7)
Neutrophils Relative %: 75.1 % (ref 43.0–77.0)
Platelets: 441 K/uL — ABNORMAL HIGH (ref 150.0–400.0)
RBC: 2.61 Mil/uL — ABNORMAL LOW (ref 3.87–5.11)
RDW: 17.1 % — ABNORMAL HIGH (ref 11.5–15.5)
WBC: 8.9 K/uL (ref 4.0–10.5)

## 2023-12-26 LAB — URINALYSIS, ROUTINE W REFLEX MICROSCOPIC
Bilirubin Urine: NEGATIVE
Hgb urine dipstick: NEGATIVE
Ketones, ur: NEGATIVE
Leukocytes,Ua: NEGATIVE
Nitrite: NEGATIVE
Specific Gravity, Urine: 1.025 (ref 1.000–1.030)
Total Protein, Urine: 30 — AB
Urine Glucose: NEGATIVE
Urobilinogen, UA: 1 (ref 0.0–1.0)
pH: 6 (ref 5.0–8.0)

## 2023-12-26 LAB — LIPID PANEL
Cholesterol: 142 mg/dL (ref 0–200)
HDL: 47.5 mg/dL (ref >39.00)
LDL Cholesterol: 80 mg/dL (ref 0–99)
NonHDL: 94.43
Total CHOL/HDL Ratio: 3
Triglycerides: 71 mg/dL (ref 0.0–149.0)
VLDL: 14.2 mg/dL (ref 0.0–40.0)

## 2023-12-26 LAB — BASIC METABOLIC PANEL WITH GFR
BUN: 15 mg/dL (ref 6–23)
CO2: 28 meq/L (ref 19–32)
Calcium: 8.9 mg/dL (ref 8.4–10.5)
Chloride: 106 meq/L (ref 96–112)
Creatinine, Ser: 0.83 mg/dL (ref 0.40–1.20)
GFR: 65.63 mL/min (ref >60.00)
Glucose, Bld: 87 mg/dL (ref 70–99)
Potassium: 4.8 meq/L (ref 3.5–5.1)
Sodium: 142 meq/L (ref 135–145)

## 2023-12-26 LAB — HEPATIC FUNCTION PANEL
ALT: 21 U/L (ref 0–35)
AST: 13 U/L (ref 0–37)
Albumin: 4 g/dL (ref 3.5–5.2)
Alkaline Phosphatase: 55 U/L (ref 39–117)
Bilirubin, Direct: 0.5 mg/dL — ABNORMAL HIGH (ref 0.0–0.3)
Total Bilirubin: 3.3 mg/dL — ABNORMAL HIGH (ref 0.2–1.2)
Total Protein: 6.2 g/dL (ref 6.0–8.3)

## 2023-12-26 LAB — IBC + FERRITIN
Ferritin: 616.5 ng/mL — ABNORMAL HIGH (ref 10.0–291.0)
Iron: 56 ug/dL (ref 42–145)
Saturation Ratios: 18.6 % — ABNORMAL LOW (ref 20.0–50.0)
TIBC: 301 ug/dL (ref 250.0–450.0)
Transferrin: 215 mg/dL (ref 212.0–360.0)

## 2023-12-26 LAB — HM DIABETES FOOT EXAM

## 2023-12-26 LAB — HEMOGLOBIN A1C: Hgb A1c MFr Bld: 6 % (ref 4.6–6.5)

## 2023-12-26 MED ORDER — ESCITALOPRAM OXALATE 20 MG PO TABS: 20.0000 mg | ORAL_TABLET | Freq: Every day | ORAL | 1 refills | Status: DC

## 2023-12-26 MED ORDER — PRAVASTATIN SODIUM 10 MG PO TABS: ORAL_TABLET | ORAL | 1 refills | Status: DC

## 2023-12-26 MED ORDER — TELMISARTAN 80 MG PO TABS
80.0000 mg | ORAL_TABLET | Freq: Every day | ORAL | 3 refills | Status: AC
Start: 2023-12-26 — End: ?

## 2023-12-26 NOTE — Progress Notes (Signed)
 Subjective:    Patient ID: Lori Guzman, female    DOB: [birth year], 83 y.o.   MRN: [id number]  Patient here for  Chief Complaint  Patient presents with   Hospitalization Follow-up    HPI Here for a hospital follow up. Admitted 12/14/23 - 12/20/23 - after falling resulting in a hamstring tendon rupture of the right leg. Elected nonoperative management. Worked with PT/OT. Pain control - tylenol, lidocaine  patches and voltaren gel. Eliquis  held - due to large hematoma. Hgb decreased to 7-8 range during admision. Iron low. Given IV iron and recommended oral iron on discharge. AKI resolved by discharge. She comes in today accompanied by her daughter. History obtained from both of them. She is able to get around her house. Weak. Breathing stable. No increased cough or congestion. No abdominal pain or bowel change. Hematoma is starting to reabsorb.    Past Medical History:  Diagnosis Date   Allergy    reoccuring problems   Anxiety    in the past   Cataract    small being watched   Depression March 2023   problem solved   GERD (gastroesophageal reflux disease)    History of colon polyps    adenomatous   Hyperlipidemia    Hypertension    Past Surgical History:  Procedure Laterality Date   BARTHOLIN GLAND CYST EXCISION     CARDIOVERSION N/A 02/26/2023   Procedure: CARDIOVERSION;  Surgeon: Perla Evalene PARAS, MD;  Location: [facility] ORS;  Service: Cardiovascular;  Laterality: N/A;   CHOLECYSTECTOMY     NASAL POLYP SURGERY  1964   TONSILECTOMY/ADENOIDECTOMY WITH MYRINGOTOMY  1950   Family History  Problem Relation Age of Onset   Heart disease Mother    CVA Mother    Hypertension Mother    Alzheimer's disease Mother    Esophageal cancer Father    Breast cancer Neg Hx    Colon cancer Neg Hx    Social History   Socioeconomic History   Marital status: Widowed    Spouse name: Not on file   Number of children: Not on file   Years of education: Not on file   Highest education  level: Associate degree: academic program  Occupational History   Not on file  Tobacco Use   Smoking status: Never    Passive exposure: Past   Smokeless tobacco: Never   Tobacco comments:    Never  Vaping Use   Vaping status: Never Used  Substance and Sexual Activity   Alcohol use: No   Drug use: No   Sexual activity: Not Currently    Comment: 83 years old  Other Topics Concern   Not on file  Social History Narrative   She is married, has 3 children and is retired.   Social Drivers of Corporate Investment Banker Strain: Low Risk (12/18/2023)   Received from [facility]   Overall Financial Resource Strain (CARDIA)    How hard is it for you to pay for the very basics like food, housing, medical care, and heating?: Not very hard  Food Insecurity: No Food Insecurity (12/24/2023)   Hunger Vital Sign    Worried About Running Out of Food in the Last Year: Never true    Ran Out of Food in the Last Year: Never true  Transportation Needs: No Transportation Needs (12/24/2023)   PRAPARE - Administrator, Civil Service (Medical): No    Lack of Transportation (Non-Medical): No  Physical Activity: Insufficiently Active (  08/06/2023)   Exercise Vital Sign    Days of Exercise per Week: 3 days    Minutes of Exercise per Session: 30 min  Stress: No Stress Concern Present (08/06/2023)   [employer] of Occupational Health - Occupational Stress Questionnaire    Feeling of Stress: Not at all  Social Connections: Moderately Isolated (08/06/2023)   Social Connection and Isolation Panel    Frequency of Communication with Friends and Family: More than three times a week    Frequency of Social Gatherings with Friends and Family: More than three times a week    Attends Religious Services: More than 4 times per year    Active Member of [employer] or Organizations: No    Attends Banker Meetings: Not on file    Marital Status: Widowed     Review of Systems   Constitutional:  Negative for appetite change and unexpected weight change.  HENT:  Negative for congestion and sinus pressure.   Respiratory:  Negative for cough and chest tightness.        No increased sob. Breathing stable.   Cardiovascular:  Negative for chest pain and palpitations.       Leg swelling - due to increased hematoma. Is improving - per pt.   Gastrointestinal:  Negative for abdominal pain, diarrhea, nausea and vomiting.  Genitourinary:  Negative for difficulty urinating and dysuria.  Musculoskeletal:  Negative for myalgias.  Skin:  Negative for rash.       Large hematoma.   Neurological:  Negative for headaches.  Psychiatric/Behavioral:  Negative for agitation and dysphoric mood.        Objective:     BP 136/78   Pulse (!) 103   Temp 98.5 F (36.9 C) (Oral)   Ht 5' 4 (1.626 m)   Wt 186 lb (84.4 kg)   LMP 02/29/1992   SpO2 97%   BMI 31.93 kg/m  Wt Readings from Last 3 Encounters:  12/26/23 186 lb (84.4 kg)  11/26/23 179 lb 12.8 oz (81.6 kg)  11/25/23 178 lb 3.2 oz (80.8 kg)    Physical Exam Vitals reviewed.  Constitutional:      General: She is not in acute distress.    Appearance: Normal appearance.  HENT:     Head: Normocephalic and atraumatic.     Right Ear: External ear normal.     Left Ear: External ear normal.     Mouth/Throat:     Pharynx: No oropharyngeal exudate or posterior oropharyngeal erythema.  Eyes:     General: No scleral icterus.       Right eye: No discharge.        Left eye: No discharge.     Conjunctiva/sclera: Conjunctivae normal.  Neck:     Thyroid : No thyromegaly.  Cardiovascular:     Rate and Rhythm: Normal rate and regular rhythm.  Pulmonary:     Effort: No respiratory distress.     Breath sounds: Normal breath sounds. No wheezing.  Abdominal:     General: Bowel sounds are normal.     Palpations: Abdomen is soft.     Tenderness: There is no abdominal tenderness.  Musculoskeletal:     Cervical back: Neck supple.  No tenderness.     Comments: Increased lower extremity swelling/large hematoma. No increased erythema. Palpable pedal pulses.   Lymphadenopathy:     Cervical: No cervical adenopathy.  Skin:    Findings: No erythema or rash.  Neurological:     Mental Status: She is alert.  Psychiatric:        Mood and Affect: Mood normal.        Behavior: Behavior normal.         Outpatient Encounter Medications as of 12/26/2023  Medication Sig   acetaminophen (TYLENOL) 500 MG tablet Take 500 mg by mouth every 6 (six) hours as needed.   diclofenac Sodium (VOLTAREN) 1 % GEL Apply 2 g topically 4 (four) times daily.   ELIQUIS  5 MG TABS tablet TAKE 1 TABLET BY MOUTH TWICE A DAY   fluticasone (FLONASE) 50 MCG/ACT nasal spray Place 1 spray into both nostrils daily as needed (Congestion).   Fluticasone Furoate  (ARNUITY ELLIPTA ) 100 MCG/ACT AEPB Inhale 1 puff into the lungs daily.   lactulose (CHRONULAC) 10 GM/15ML solution Take 10 g by mouth 3 (three) times daily.   levalbuterol  (XOPENEX  HFA) 45 MCG/ACT inhaler Inhale 2 puffs into the lungs every 4 (four) hours as needed for wheezing.   lidocaine  (LIDODERM ) 5 % Place 1 patch onto the skin daily. 4 % patch. Place 2 patches on skin daily as needed ( leg Pain) per [facility] center discharge instructions. Patient stated per her discharge instructions   metoprolol  succinate (TOPROL -XL) 25 MG 24 hr tablet Take 1 tablet (25 mg total) by mouth 2 (two) times daily. (Patient taking differently: Take 25 mg by mouth 2 (two) times daily. Patient states she takes 1 tablet daily)   Multiple Vitamins-Minerals (ICAPS AREDS FORMULA PO) Take 1 tablet by mouth daily.   Polyethyl Glycol-Propyl Glycol (SYSTANE) 0.4-0.3 % SOLN Place 1 drop into both eyes daily as needed (Dry eye).   polyethylene glycol powder (GLYCOLAX/MIRALAX) 17 GM/SCOOP powder Take 17 g by mouth.   Probiotic Product (PROBIOTIC PO) Take 1 capsule by mouth daily.   senna (SENOKOT) 8.6 MG TABS tablet Take 2  tablets by mouth daily.   amiodarone  (PACERONE ) 200 MG tablet Take 1 tablet (200 mg total) by mouth daily.   bisacodyl (DULCOLAX) 10 MG suppository Place 10 mg rectally as needed for moderate constipation.   escitalopram  (LEXAPRO ) 20 MG tablet Take 1 tablet (20 mg total) by mouth daily.   pravastatin  (PRAVACHOL ) 10 MG tablet TAKE ONE TABLET EVERY MONDAY, WEDNESDAY AND FRIDAY   telmisartan  (MICARDIS ) 80 MG tablet Take 1 tablet (80 mg total) by mouth daily.   [DISCONTINUED] amiodarone  (PACERONE ) 200 MG tablet Take 1 tablet (200 mg total) by mouth 2 (two) times daily for 14 days. (Patient not taking: Reported on 12/24/2023)   [DISCONTINUED] apixaban  (ELIQUIS ) 5 MG TABS tablet TAKE 1 TABLET BY MOUTH TWICE A DAY (Patient not taking: Reported on 12/24/2023)   [DISCONTINUED] escitalopram  (LEXAPRO ) 20 MG tablet TAKE 1 TABLET BY MOUTH EVERY DAY   [DISCONTINUED] pravastatin  (PRAVACHOL ) 10 MG tablet TAKE ONE TABLET EVERY MONDAY, WEDNESDAY AND FRIDAY   [DISCONTINUED] telmisartan  (MICARDIS ) 80 MG tablet Take 1 tablet (80 mg total) by mouth daily.   No facility-administered encounter medications on file as of 12/26/2023.     Lab Results  Component Value Date   WBC 8.9 12/26/2023   HGB 8.5 Repeated and verified X2. (L) 12/26/2023   HCT 25.6 Repeated and verified X2. (L) 12/26/2023   PLT 441.0 (H) 12/26/2023   GLUCOSE 87 12/26/2023   CHOL 142 12/26/2023   TRIG 71.0 12/26/2023   HDL 47.50 12/26/2023   LDLCALC 80 12/26/2023   ALT 21 12/26/2023   AST 13 12/26/2023   NA 142 12/26/2023   K 4.8 12/26/2023   CL 106 12/26/2023   CREATININE  0.83 12/26/2023   BUN 15 12/26/2023   CO2 28 12/26/2023   TSH 2.39 04/08/2023   HGBA1C 6.0 12/26/2023   MICROALBUR 7.5 (H) 09/18/2023    CT Chest High Resolution Result Date: 05/17/2023 CLINICAL DATA:  Dyspnea on exertion. Follow-up interstitial lung disease. EXAM: CT CHEST WITHOUT CONTRAST TECHNIQUE: Multidetector CT imaging of the chest was performed following the  standard protocol without intravenous contrast. High resolution imaging of the lungs, as well as inspiratory and expiratory imaging, was performed. RADIATION DOSE REDUCTION: This exam was performed according to the departmental dose-optimization program which includes automated exposure control, adjustment of the mA and/or kV according to patient size and/or use of iterative reconstruction technique. COMPARISON:  08/08/2022 high-resolution chest CT. 03/22/2023 chest radiograph. FINDINGS: Cardiovascular: Top-normal heart size. No significant pericardial effusion/thickening. Left anterior descending coronary atherosclerosis. Atherosclerotic nonaneurysmal thoracic aorta. Normal caliber pulmonary arteries. Mediastinum/Nodes: No significant thyroid  nodules. Unremarkable esophagus. No pathologically enlarged axillary, mediastinal or hilar lymph nodes, noting limited sensitivity for the detection of hilar adenopathy on this noncontrast study. Lungs/Pleura: No pneumothorax. No pleural effusion. No acute consolidative airspace disease, lung masses or significant pulmonary nodules. Mild patchy air trapping in both lungs is unchanged. No evidence of tracheobronchomalacia on the expiration sequence. Scattered thin curvilinear parenchymal bands and very mild patchy peripheral reticulation and ground-glass opacity in the lower lungs bilaterally without significant regions of traction bronchiectasis, architectural distortion or frank honeycombing. The ground-glass opacities are decreased since 08/08/2022 CT. No new or progressive findings. Upper abdomen: Small hiatal hernia. Simple 1.0 cm liver dome cyst. Cholecystectomy. Musculoskeletal: No aggressive appearing focal osseous lesions. Moderate thoracic spondylosis. IMPRESSION: 1. Mild patchy air trapping in both lungs, indicative of small airways disease, unchanged. 2. Mild patchy peripheral reticulation and ground-glass opacity at the lung bases without significant bronchiectasis  or honeycombing. These findings have improved since 08/08/2022 chest CT, favoring bland mild postinfectious/postinflammatory scarring, with a mild interstitial lung disease such as NSIP not entirely excluded but less favored. 3. One vessel coronary atherosclerosis. 4. Small hiatal hernia. 5.  Aortic Atherosclerosis (ICD10-I70.0). Electronically Signed   By: Selinda DELENA Blue M.D.   On: 05/17/2023 17:11       Assessment & Plan:  Flu vaccine need -     Flu vaccine HIGH DOSE PF(Fluzone Trivalent)  Primary hypertension Assessment & Plan: Blood pressure medication was held in hospital. Was instructed to restart at discharge. Blood pressure as outlined. Follow pressures. Check metabolic panel.   Orders: -     Basic metabolic panel with GFR  Hypercholesterolemia Assessment & Plan: Continue pravastatin .  Low-cholesterol diet and exercise.  Follow lipid panel and liver function testing.  Orders: -     Lipid panel -     Hepatic function panel  Type 2 diabetes mellitus with hyperglycemia, without long-term current use of insulin (HCC) Assessment & Plan: Low-carb diet and exercise.  On no medication. Follow met b and A1c.  Lab Results  Component Value Date   HGBA1C 6.0 12/26/2023    Orders: -     Hemoglobin A1c  Urinary frequency Assessment & Plan: Check urine to confirm no infection present.   Orders: -     Urinalysis, Routine w reflex microscopic -     Urine Culture  Anemia, unspecified type Assessment & Plan: Hgb found to be low in the hospital. Was given iron infusion. Recheck cbc and iron studies today.   Orders: -     CBC with Differential/Platelet -     IBC + Ferritin  Stress Assessment & Plan: Continue lexapro . Has good support.    Persistent atrial fibrillation [facility]) Assessment & Plan: Status post cardioversion 02/26/2023.  Echocardiogram as outlined.  Continue Eliquis  and metoprolol .  Appears to be in sinus rhythm. Overall doing well. Continue f/u with cardiology.     Mild intermittent asthma without complication Assessment & Plan: Dr Tamea - 11/25/23 - arnuity, xopenex  inhaler.    Other orders -     Escitalopram  Oxalate; Take 1 tablet (20 mg total) by mouth daily.  Dispense: 90 tablet; Refill: 1 -     Pravastatin  Sodium; TAKE ONE TABLET EVERY MONDAY, WEDNESDAY AND FRIDAY  Dispense: 39 tablet; Refill: 1 -     Telmisartan ; Take 1 tablet (80 mg total) by mouth daily.  Dispense: 90 tablet; Refill: 3     Allena Hamilton, MD

## 2023-12-27 LAB — URINE CULTURE
MICRO NUMBER:: 17231313
Result:: NO GROWTH
SPECIMEN QUALITY:: ADEQUATE

## 2023-12-30 ENCOUNTER — Encounter: Payer: Self-pay | Admitting: Internal Medicine

## 2023-12-30 ENCOUNTER — Ambulatory Visit: Payer: Self-pay | Admitting: Internal Medicine

## 2023-12-30 NOTE — Assessment & Plan Note (Signed)
 Check urine to confirm no infection present.

## 2023-12-30 NOTE — Assessment & Plan Note (Signed)
 Blood pressure medication was held in hospital. Was instructed to restart at discharge. Blood pressure as outlined. Follow pressures. Check metabolic panel.

## 2023-12-30 NOTE — Assessment & Plan Note (Signed)
Continue lexapro.  Has good support.

## 2023-12-30 NOTE — Assessment & Plan Note (Signed)
 Status post cardioversion 02/26/2023.  Echocardiogram as outlined.  Continue Eliquis  and metoprolol .  Appears to be in sinus rhythm. Overall doing well. Continue f/u with cardiology.

## 2023-12-30 NOTE — Assessment & Plan Note (Signed)
 Hgb found to be low in the hospital. Was given iron infusion. Recheck cbc and iron studies today.

## 2023-12-30 NOTE — Assessment & Plan Note (Signed)
 Continue pravastatin.  Low-cholesterol diet and exercise.  Follow lipid panel and liver function testing.

## 2023-12-30 NOTE — Assessment & Plan Note (Signed)
 Dr Tamea - 11/25/23 - arnuity, xopenex  inhaler.

## 2023-12-30 NOTE — Assessment & Plan Note (Signed)
 Low-carb diet and exercise.  On no medication. Follow met b and A1c.  Lab Results  Component Value Date   HGBA1C 6.0 12/26/2023

## 2024-01-13 ENCOUNTER — Other Ambulatory Visit (INDEPENDENT_AMBULATORY_CARE_PROVIDER_SITE_OTHER)

## 2024-01-13 ENCOUNTER — Other Ambulatory Visit: Payer: Self-pay

## 2024-01-13 DIAGNOSIS — D649 Anemia, unspecified: Secondary | ICD-10-CM | POA: Diagnosis not present

## 2024-01-13 DIAGNOSIS — I10 Essential (primary) hypertension: Secondary | ICD-10-CM

## 2024-01-13 DIAGNOSIS — E7800 Pure hypercholesterolemia, unspecified: Secondary | ICD-10-CM

## 2024-01-13 LAB — CBC WITH DIFFERENTIAL/PLATELET
Basophils Absolute: 0 K/uL (ref 0.0–0.1)
Basophils Relative: 0.6 % (ref 0.0–3.0)
Eosinophils Absolute: 0.1 K/uL (ref 0.0–0.7)
Eosinophils Relative: 0.9 % (ref 0.0–5.0)
HCT: 32.5 % — ABNORMAL LOW (ref 36.0–46.0)
Hemoglobin: 10.8 g/dL — ABNORMAL LOW (ref 12.0–15.0)
Lymphocytes Relative: 26.8 % (ref 12.0–46.0)
Lymphs Abs: 1.5 K/uL (ref 0.7–4.0)
MCHC: 33.2 g/dL (ref 30.0–36.0)
MCV: 97.4 fl (ref 78.0–100.0)
Monocytes Absolute: 0.5 K/uL (ref 0.1–1.0)
Monocytes Relative: 9 % (ref 3.0–12.0)
Neutro Abs: 3.5 K/uL (ref 1.4–7.7)
Neutrophils Relative %: 62.7 % (ref 43.0–77.0)
Platelets: 290 K/uL (ref 150.0–400.0)
RBC: 3.34 Mil/uL — ABNORMAL LOW (ref 3.87–5.11)
RDW: 16.8 % — ABNORMAL HIGH (ref 11.5–15.5)
WBC: 5.6 K/uL (ref 4.0–10.5)

## 2024-01-13 LAB — BASIC METABOLIC PANEL WITH GFR
BUN: 14 mg/dL (ref 6–23)
CO2: 28 meq/L (ref 19–32)
Calcium: 8.8 mg/dL (ref 8.4–10.5)
Chloride: 106 meq/L (ref 96–112)
Creatinine, Ser: 0.83 mg/dL (ref 0.40–1.20)
GFR: 65.6 mL/min (ref >60.00)
Glucose, Bld: 95 mg/dL (ref 70–99)
Potassium: 4 meq/L (ref 3.5–5.1)
Sodium: 140 meq/L (ref 135–145)

## 2024-01-13 LAB — HEPATIC FUNCTION PANEL
ALT: 15 U/L (ref 0–35)
AST: 15 U/L (ref 0–37)
Albumin: 4.2 g/dL (ref 3.5–5.2)
Alkaline Phosphatase: 57 U/L (ref 39–117)
Bilirubin, Direct: 0.2 mg/dL (ref 0.0–0.3)
Total Bilirubin: 1.6 mg/dL — ABNORMAL HIGH (ref 0.2–1.2)
Total Protein: 6.5 g/dL (ref 6.0–8.3)

## 2024-01-14 ENCOUNTER — Ambulatory Visit: Payer: Self-pay | Admitting: Internal Medicine

## 2024-01-21 NOTE — Telephone Encounter (Signed)
 Copied from CRM #[id number]. Topic: General - Other >> Jan 21, 2024  3:27 PM Ashley R wrote: Reason for CRM: [facility] nurse Reporting a fall on Saturday. bruise on left bottom cheek. Sore. Stumbled over robot vacuum.

## 2024-01-22 ENCOUNTER — Telehealth: Payer: Self-pay

## 2024-01-22 NOTE — Telephone Encounter (Signed)
 Reviewed. Discussed with CMA. No head injury. Wanted to monitor. Wanted to hold on appt at this time. Will call or be evaluated if changes mind or if any change in symptoms.

## 2024-01-22 NOTE — Telephone Encounter (Signed)
 Anice Belt, CMA    01/21/24  3:36 PM Unsigned Note Copied from CRM #[id number]. Topic: General - Other >> Jan 21, 2024  3:27 PM Ashley R wrote: Reason for CRM: [facility] nurse Reporting a fall on Saturday. bruise on left bottom cheek. Sore. Stumbled over robot vacuum.

## 2024-01-27 ENCOUNTER — Telehealth: Payer: Self-pay

## 2024-01-27 NOTE — Telephone Encounter (Signed)
 Copied from CRM #[id number]. Topic: General - Other >> Jan 27, 2024 11:21 AM Aleatha BROCKS wrote: Reason for CRM: Physical therapist with [facility] is with patient and her blood pressure is 158/90 and that is over their [place]

## 2024-01-27 NOTE — Telephone Encounter (Signed)
 If she is doing ok, agree with continue to monitor blood pressure and send in readings. If persistent elevation or any acute problems, needs to be evaluated.

## 2024-01-28 NOTE — Telephone Encounter (Signed)
 Pt notified. Pt in agreement with plan

## 2024-01-31 ENCOUNTER — Encounter: Payer: Self-pay | Admitting: Internal Medicine

## 2024-02-03 NOTE — Telephone Encounter (Signed)
 Please call and let her know that I reviewed her pressures. Pressures are averaging 120-130-70-80s. Have her to continue the current medication regimen for now. Follow pressures and send in more readings.

## 2024-02-04 NOTE — Telephone Encounter (Signed)
 Pt.notified

## 2024-02-17 ENCOUNTER — Encounter: Payer: Self-pay | Admitting: Internal Medicine

## 2024-02-28 ENCOUNTER — Ambulatory Visit: Admitting: Student in an Organized Health Care Education/Training Program

## 2024-02-28 ENCOUNTER — Encounter: Payer: Self-pay | Admitting: Student in an Organized Health Care Education/Training Program

## 2024-02-28 VITALS — BP 150/92 | HR 84 | Ht 64.0 in | Wt 182.0 lb

## 2024-02-28 DIAGNOSIS — I4819 Other persistent atrial fibrillation: Secondary | ICD-10-CM

## 2024-02-28 NOTE — Patient Instructions (Signed)

## 2024-02-28 NOTE — Progress Notes (Deleted)
" °  Cardiology Office Note   Date:  02/28/2024  ID:  Lori Guzman, DOB Apr 27, 1940, MRN [id number] PCP: Glendia Shad, MD  [facility] Providers Cardiologist:  None Electrophysiologist:  Fonda Kitty, MD { Click to update primary MD,subspecialty MD or APP then REFRESH:1}    History of Present Illness Lori Guzman is a 84 y.o. female ***  ROS: ***  Studies Reviewed      *** Risk Assessment/Calculations {Does this patient have ATRIAL FIBRILLATION?:[phone]} The patient's 1st BP is elevated (>139/89)*** Repeat BP and {Click to enter a 2nd BP Refresh Note  :1}       Physical Exam VS:  BP (!) 150/92 (BP Location: Left Arm, Patient Position: Sitting, Cuff Size: Normal)   Pulse 84   Ht 5' 4 (1.626 m)   Wt 182 lb (82.6 kg)   LMP 02/29/1992   SpO2 97%   BMI 31.24 kg/m        Wt Readings from Last 3 Encounters:  02/28/24 182 lb (82.6 kg)  12/26/23 186 lb (84.4 kg)  11/26/23 179 lb 12.8 oz (81.6 kg)    GEN: Well nourished, well developed in no acute distress NECK: No JVD; No carotid bruits CARDIAC: ***RRR, no murmurs, rubs, gallops RESPIRATORY:  Clear to auscultation without rales, wheezing or rhonchi  ABDOMEN: Soft, non-tender, non-distended EXTREMITIES:  No edema; No deformity   ASSESSMENT AND PLAN ***    {Are you ordering a CV Procedure (e.g. stress test, cath, DCCV, TEE, etc)?   Press F2        :[id number]}  Dispo: ***  Signed, Donnice DELENA Primus, MD  "

## 2024-02-28 NOTE — Progress Notes (Signed)
 " Cardiology Office Note   Date: 02/28/24 ID:  Lori Guzman, DOB 1940/08/13, MRN [id number] PCP: Glendia Shad, MD  [facility] Providers Cardiologist:  None Electrophysiologist:  Donnice DELENA Primus, MD   History of Present Illness Lori Guzman is a 84 y.o. female with persistent AF, HTN, HLD, and COPD who presents for arrhythmia management.   Discussed the use of AI scribe software for clinical note transcription with the patient, who gave verbal consent to proceed. History of Present Illness Lori Guzman is an 84 year old female with atrial fibrillation who presents for evaluation of her rhythm issues. She is accompanied by her daughter, Lori Guzman. She was referred by Dr. Glendia for evaluation of her atrial fibrillation.  She was seen by Dr. Gollan 02/05/23 for AF and had DCCV 02/26/23 with preserved LVEF on TTE but with severe LAE. She then had more frequent AF on f/u and on 11/04/23 had declined DCCV but was started on amio. She then stopped Amio after she ran out of medications and she presented back to clinic still in AF/AFL. She was restarted on amio 11/26/23 with plans for EP referral.   She has a history of atrial fibrillation and underwent cardioversion in January of the previous year, which was initially successful. In September, she experienced a recurrence of symptoms after overexerting herself while working outdoors without adequate hydration, leading to a fall and facial injuries, though she did not lose consciousness.  In November, she sustained a third-degree hamstring tear after slipping on a wet leaf while working outdoors. This injury required a week-long hospitalization and blood transfusions due to significant bruising and blood loss. She completed nine weeks of physical therapy for recovery.  She is currently on amiodarone , taken once daily at night, and has been on this medication since her fall, initially at a higher dose before reducing to the  current dose. She also takes Eliquis . Her smartwatch indicates she is in atrial fibrillation all the time. She recalls feeling better and having more energy after her previous cardioversion, with less breathlessness and improved ability to walk.  She lives independently in [place], with her daughter and sons living nearby. She enjoys working outdoors and has experienced overexertion and dehydration during her activities. 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  She feels weak and exhausted after physical exertion, particularly when dehydrated. No loss of consciousness during her fall but experienced significant weakness.  ROS: fatigue   Studies Reviewed  ECG review 02/28/24: AF/VR 84, QRS 82, QT/c 216/255 11/26/23: AF/VR 88, QRS 82, QT/c 366/442 11/04/23: AF/RVR 111, QRS 82, QT/c 330/448 06/17/23: NSR 60, PR 194, QRS 76, QT/c 444/444 03/08/23: SB 54, PR 186, QRS 66, QT/c 486/460 02/26/23: AF/VR 91, QRS 76, QT/c 293/357 02/21/23: AF/VR 98, QRS 62, QT/c 360/459 02/05/23: AF/VR 100, QRS 80, QT/c 344/443  TTE Result date: 02/12/23  1. Left ventricular ejection fraction, by estimation, is 60 to 65%. The  left ventricle has normal function. The left ventricle has no regional  wall motion abnormalities. Left ventricular diastolic parameters are  indeterminate.   2. Right ventricular systolic function is normal. The right ventricular  size is normal. There is moderately elevated pulmonary artery systolic  pressure. The estimated right ventricular systolic pressure is 41.0 mmHg.   3. Left atrial size was severely dilated.   4. The mitral valve is normal in structure. Mild to moderate mitral valve  regurgitation.   5. The aortic valve was not well visualized.  Aortic valve regurgitation  is not visualized.   6. The inferior vena cava is dilated in size with <50% respiratory  variability, suggesting right atrial pressure of 15 mmHg.  Severe LAE, RA nl size   Risk  Assessment/Calculations  CHA2DS2-VASc Score = 5  This indicates a 7.2% annual risk of stroke. The patient's score is based upon: CHF History: 0 HTN History: 1 Diabetes History: 1 Stroke History: 0 Vascular Disease History: 0 Age Score: 2 Gender Score: 1  Physical Exam VS:  BP (!) 150/92 (BP Location: Left Arm, Patient Position: Sitting, Cuff Size: Normal)   Pulse 84   Ht 5' 4 (1.626 m)   Wt 182 lb (82.6 kg)   LMP 02/29/1992   SpO2 97%   BMI 31.24 kg/m   Wt Readings from Last 3 Encounters:  02/28/24 182 lb (82.6 kg)  12/26/23 186 lb (84.4 kg)  11/26/23 179 lb 12.8 oz (81.6 kg)    GEN: Well nourished, well developed in no acute distress CARDIAC: irregular rhythm, rate controlled, no murmurs, rubs, gallops RESPIRATORY: Clear to auscultation without rales, wheezing or rhonchi  EXTREMITIES: No edema; No deformity   ASSESSMENT AND PLAN Lori Guzman is a 84 y.o. female with persistent AF, HTN, HLD, and COPD who presents for arrhythmia management.  Assessment & Plan Persistent atrial fibrillation Transitioned from paroxysmal to persistent AF over six months. Currently in AFib with controlled ventricular rate. Amiodarone  effective but insufficient for rhythm maintenance. Discussed cardioversion and catheter ablation risks and benefits. Prefers to avoid lifelong AFib. Ablation more efficient with reduced procedure time. Risks include bleeding, heart wall damage, and stroke. - Continue amiodarone . - Consider cardioversion to restore normal rhythm. - Consider catheter ablation with cardioversion under general anesthesia. - Discuss with family and decide on preferred treatment approach. - Contact provider to schedule cardioversion or ablation.  Discussed treatment options today for AF including antiarrhythmic drug therapy and ablation. Discussed risks, recovery and likelihood of success with each treatment strategy. Risk, benefits, and alternatives to EP study and ablation for  afib were discussed. These risks include but are not limited to stroke, bleeding, vascular damage, tamponade, perforation, damage to the esophagus, lungs, phrenic nerve and other structures, worsening renal function, coronary vasospasm and death.  Discussed potential need for repeat ablation procedures and antiarrhythmic drugs after an initial ablation. The patient understands these risk and wishes to consider her options (DCCV alone vs DCCV+ablation).   Left atrial enlargement Noted on echocardiogram. Likely due to prolonged AFib. Potential size reduction if normal rhythm restored. Ablation may help reduce size, though complete normalization unlikely. - Aim to restore normal rhythm to potentially reduce atrial size. - Discussed that with severe LAE she still Lori likely require amio for maintenance of NSR even after ablation    Dispo: RTC 1 yr, asked her to call me within the next 4 weeks to decide on DCCV vs ablation, provided contact number   A total of 50 minutes was spent preparing for the patient, reviewing history, performing exam, document encounter, coordinating care and counseling the patient. 36 minutes was spent with direct patient care.   Signed, Donnice DELENA Primus, MD  "

## 2024-03-04 ENCOUNTER — Ambulatory Visit: Admitting: Internal Medicine

## 2024-03-04 ENCOUNTER — Encounter: Payer: Self-pay | Admitting: Internal Medicine

## 2024-03-04 VITALS — BP 144/82 | HR 87 | Temp 98.2°F | Ht 64.0 in | Wt 183.6 lb

## 2024-03-04 DIAGNOSIS — I4819 Other persistent atrial fibrillation: Secondary | ICD-10-CM

## 2024-03-04 DIAGNOSIS — E7800 Pure hypercholesterolemia, unspecified: Secondary | ICD-10-CM | POA: Diagnosis not present

## 2024-03-04 DIAGNOSIS — D649 Anemia, unspecified: Secondary | ICD-10-CM

## 2024-03-04 DIAGNOSIS — J4520 Mild intermittent asthma, uncomplicated: Secondary | ICD-10-CM | POA: Diagnosis not present

## 2024-03-04 DIAGNOSIS — I10 Essential (primary) hypertension: Secondary | ICD-10-CM | POA: Diagnosis not present

## 2024-03-04 DIAGNOSIS — E1165 Type 2 diabetes mellitus with hyperglycemia: Secondary | ICD-10-CM | POA: Diagnosis not present

## 2024-03-04 MED ORDER — ESCITALOPRAM OXALATE 20 MG PO TABS: 20.0000 mg | ORAL_TABLET | Freq: Every day | ORAL | 1 refills | Status: AC

## 2024-03-04 MED ORDER — PRAVASTATIN SODIUM 10 MG PO TABS: ORAL_TABLET | ORAL | 1 refills | Status: AC

## 2024-03-04 MED ORDER — METOPROLOL SUCCINATE ER 25 MG PO TB24: 25.0000 mg | ORAL_TABLET | Freq: Two times a day (BID) | ORAL | 3 refills | Status: AC

## 2024-03-04 NOTE — Progress Notes (Signed)
 "  Subjective:    Patient ID: Lori Guzman, female    DOB: [birth year], 84 y.o.   MRN: [id number]  Patient here for  Chief Complaint  Patient presents with   Medical Management of Chronic Issues    Discuss cardio appt     HPI Here for a scheduled follow up - follow up regarding hypertension, diabetes and hypercholesterolemia. Admitted 12/14/23 - 12/20/23 - after falling resulting in a hamstring tendon rupture of the right leg. Had f/u 11/14 - continue HH PT. Ice. Elected nonoperative management. Worked with PT/OT. Had f/u with cardiology 02/28/24 - f/u afib. Continue amiodarone . Consider cardioversion/ablation. Discussed. No chest pain. Breathing stable. No abdominal pain or bowel change reported.    Past Medical History:  Diagnosis Date   Allergy    reoccuring problems   Anxiety    in the past   Arrhythmia    Cataract    small being watched   Depression March 2023   problem solved   GERD (gastroesophageal reflux disease)    sometime   History of colon polyps    adenomatous   Hyperlipidemia    Hypertension    under control   Past Surgical History:  Procedure Laterality Date   BARTHOLIN GLAND CYST EXCISION     CARDIOVERSION N/A 02/26/2023   Procedure: CARDIOVERSION;  Surgeon: Perla Evalene PARAS, MD;  Location: [facility] ORS;  Service: Cardiovascular;  Laterality: N/A;   CHOLECYSTECTOMY     NASAL POLYP SURGERY  1964   TONSILECTOMY/ADENOIDECTOMY WITH MYRINGOTOMY  1950   Family History  Problem Relation Age of Onset   Heart disease Mother    CVA Mother    Hypertension Mother    Alzheimer's disease Mother    Esophageal cancer Father    Breast cancer Neg Hx    Colon cancer Neg Hx    Social History   Socioeconomic History   Marital status: Widowed    Spouse name: Not on file   Number of children: Not on file   Years of education: Not on file   Highest education level: Associate degree: occupational, scientist, product/process development, or vocational program  Occupational History   Not on file   Tobacco Use   Smoking status: Never    Passive exposure: Past   Smokeless tobacco: Never   Tobacco comments:    Never  Vaping Use   Vaping status: Never Used  Substance and Sexual Activity   Alcohol use: No   Drug use: No   Sexual activity: Not Currently    Comment: 84 years old  Other Topics Concern   Not on file  Social History Narrative   She is married, has 3 children and is retired.   Social Drivers of Health   Tobacco Use: Low Risk (03/08/2024)   Patient History    Smoking Tobacco Use: Never    Smokeless Tobacco Use: Never    Passive Exposure: Past  Financial Resource Strain: Low Risk (02/29/2024)   Overall Financial Resource Strain (CARDIA)    Difficulty of Paying Living Expenses: Not very hard  Food Insecurity: No Food Insecurity (02/29/2024)   Epic    Worried About Programme Researcher, Broadcasting/film/video in the Last Year: Never true    Ran Out of Food in the Last Year: Never true  Transportation Needs: No Transportation Needs (02/29/2024)   Epic    Lack of Transportation (Medical): No    Lack of Transportation (Non-Medical): No  Physical Activity: Insufficiently Active (02/29/2024)   Exercise Vital Sign  Days of Exercise per Week: 2 days    Minutes of Exercise per Session: 20 min  Stress: No Stress Concern Present (02/29/2024)   [employer] of Occupational Health - Occupational Stress Questionnaire    Feeling of Stress: Only a little  Social Connections: Moderately Isolated (02/29/2024)   Social Connection and Isolation Panel    Frequency of Communication with Friends and Family: More than three times a week    Frequency of Social Gatherings with Friends and Family: More than three times a week    Attends Religious Services: More than 4 times per year    Active Member of [employer] or Organizations: No    Attends Banker Meetings: Not on file    Marital Status: Widowed  Depression (PHQ2-9): Low Risk (12/26/2023)   Depression (PHQ2-9)    PHQ-2 Score: 0  Alcohol  Screen: Low Risk (04/12/2022)   Alcohol Screen    Last Alcohol Screening Score (AUDIT): 0  Housing: Low Risk (02/29/2024)   Epic    Unable to Pay for Housing in the Last Year: No    Number of Times Moved in the Last Year: 0    Homeless in the Last Year: No  Utilities: Not At Risk (12/24/2023)   Epic    Threatened with loss of utilities: No  Health Literacy: Not on file     Review of Systems  Constitutional:  Negative for appetite change and unexpected weight change.  HENT:  Negative for congestion and sinus pressure.   Respiratory:  Negative for cough, chest tightness and shortness of breath.   Cardiovascular:  Negative for chest pain, palpitations and leg swelling.  Gastrointestinal:  Negative for abdominal pain, diarrhea, nausea and vomiting.  Genitourinary:  Negative for difficulty urinating and dysuria.  Musculoskeletal:  Negative for joint swelling and myalgias.  Skin:  Negative for color change and rash.  Neurological:  Negative for dizziness and headaches.  Psychiatric/Behavioral:  Negative for agitation and dysphoric mood.        Objective:     BP (!) 144/82   Pulse 87   Temp 98.2 F (36.8 C) (Oral)   Ht 5' 4 (1.626 m)   Wt 183 lb 9.6 oz (83.3 kg)   LMP 02/29/1992   SpO2 98%   BMI 31.51 kg/m  Wt Readings from Last 3 Encounters:  03/04/24 183 lb 9.6 oz (83.3 kg)  02/28/24 182 lb (82.6 kg)  12/26/23 186 lb (84.4 kg)    Physical Exam Vitals reviewed.  Constitutional:      General: She is not in acute distress.    Appearance: Normal appearance.  HENT:     Head: Normocephalic and atraumatic.     Right Ear: External ear normal.     Left Ear: External ear normal.     Mouth/Throat:     Pharynx: No oropharyngeal exudate or posterior oropharyngeal erythema.  Eyes:     General: No scleral icterus.       Right eye: No discharge.        Left eye: No discharge.     Conjunctiva/sclera: Conjunctivae normal.  Neck:     Thyroid : No thyromegaly.  Cardiovascular:      Comments: Rate controlled.  Pulmonary:     Effort: No respiratory distress.     Breath sounds: Normal breath sounds. No wheezing.  Abdominal:     General: Bowel sounds are normal.     Palpations: Abdomen is soft.     Tenderness: There is no abdominal tenderness.  Musculoskeletal:        General: No swelling or tenderness.     Cervical back: Neck supple. No tenderness.  Lymphadenopathy:     Cervical: No cervical adenopathy.  Skin:    Findings: No erythema or rash.  Neurological:     Mental Status: She is alert.  Psychiatric:        Mood and Affect: Mood normal.        Behavior: Behavior normal.         Outpatient Encounter Medications as of 03/04/2024  Medication Sig   amiodarone  (PACERONE ) 200 MG tablet Take 1 tablet (200 mg total) by mouth daily.   ELIQUIS  5 MG TABS tablet TAKE 1 TABLET BY MOUTH TWICE A DAY   fluticasone (FLONASE) 50 MCG/ACT nasal spray Place 1 spray into both nostrils daily as needed (Congestion).   Fluticasone Furoate  (ARNUITY ELLIPTA ) 100 MCG/ACT AEPB Inhale 1 puff into the lungs daily.   levalbuterol  (XOPENEX  HFA) 45 MCG/ACT inhaler Inhale 2 puffs into the lungs every 4 (four) hours as needed for wheezing.   Multiple Vitamins-Minerals (ICAPS AREDS FORMULA PO) Take 1 tablet by mouth daily.   Polyethyl Glycol-Propyl Glycol (SYSTANE) 0.4-0.3 % SOLN Place 1 drop into both eyes daily as needed (Dry eye).   Probiotic Product (PROBIOTIC PO) Take 1 capsule by mouth daily.   telmisartan  (MICARDIS ) 80 MG tablet Take 1 tablet (80 mg total) by mouth daily.   [DISCONTINUED] metoprolol  succinate (TOPROL -XL) 25 MG 24 hr tablet Take 1 tablet (25 mg total) by mouth 2 (two) times daily.   escitalopram  (LEXAPRO ) 20 MG tablet Take 1 tablet (20 mg total) by mouth daily.   metoprolol  succinate (TOPROL -XL) 25 MG 24 hr tablet Take 1 tablet (25 mg total) by mouth 2 (two) times daily.   pravastatin  (PRAVACHOL ) 10 MG tablet TAKE ONE TABLET EVERY MONDAY, WEDNESDAY AND FRIDAY    [DISCONTINUED] diclofenac Sodium (VOLTAREN) 1 % GEL Apply 2 g topically 4 (four) times daily.   [DISCONTINUED] escitalopram  (LEXAPRO ) 20 MG tablet Take 1 tablet (20 mg total) by mouth daily.   [DISCONTINUED] lidocaine  (LIDODERM ) 5 % Place 1 patch onto the skin daily. 4 % patch. Place 2 patches on skin daily as needed ( leg Pain) per [facility] center discharge instructions. Patient stated per her discharge instructions   [DISCONTINUED] pravastatin  (PRAVACHOL ) 10 MG tablet TAKE ONE TABLET EVERY MONDAY, WEDNESDAY AND FRIDAY   [DISCONTINUED] senna (SENOKOT) 8.6 MG TABS tablet Take 2 tablets by mouth daily. (Patient not taking: Reported on 03/04/2024)   No facility-administered encounter medications on file as of 03/04/2024.     Lab Results  Component Value Date   WBC 5.6 01/13/2024   HGB 10.8 (L) 01/13/2024   HCT 32.5 (L) 01/13/2024   PLT 290.0 01/13/2024   GLUCOSE 95 01/13/2024   CHOL 142 12/26/2023   TRIG 71.0 12/26/2023   HDL 47.50 12/26/2023   LDLCALC 80 12/26/2023   ALT 15 01/13/2024   AST 15 01/13/2024   NA 140 01/13/2024   K 4.0 01/13/2024   CL 106 01/13/2024   CREATININE 0.83 01/13/2024   BUN 14 01/13/2024   CO2 28 01/13/2024   TSH 2.39 04/08/2023   HGBA1C 6.0 12/26/2023   MICROALBUR 7.5 (H) 09/18/2023    CT Chest High Resolution Result Date: 05/17/2023 CLINICAL DATA:  Dyspnea on exertion. Follow-up interstitial lung disease. EXAM: CT CHEST WITHOUT CONTRAST TECHNIQUE: Multidetector CT imaging of the chest was performed following the standard protocol without intravenous contrast. High resolution imaging  of the lungs, as well as inspiratory and expiratory imaging, was performed. RADIATION DOSE REDUCTION: This exam was performed according to the departmental dose-optimization program which includes automated exposure control, adjustment of the mA and/or kV according to patient size and/or use of iterative reconstruction technique. COMPARISON:  08/08/2022 high-resolution chest CT.  03/22/2023 chest radiograph. FINDINGS: Cardiovascular: Top-normal heart size. No significant pericardial effusion/thickening. Left anterior descending coronary atherosclerosis. Atherosclerotic nonaneurysmal thoracic aorta. Normal caliber pulmonary arteries. Mediastinum/Nodes: No significant thyroid  nodules. Unremarkable esophagus. No pathologically enlarged axillary, mediastinal or hilar lymph nodes, noting limited sensitivity for the detection of hilar adenopathy on this noncontrast study. Lungs/Pleura: No pneumothorax. No pleural effusion. No acute consolidative airspace disease, lung masses or significant pulmonary nodules. Mild patchy air trapping in both lungs is unchanged. No evidence of tracheobronchomalacia on the expiration sequence. Scattered thin curvilinear parenchymal bands and very mild patchy peripheral reticulation and ground-glass opacity in the lower lungs bilaterally without significant regions of traction bronchiectasis, architectural distortion or frank honeycombing. The ground-glass opacities are decreased since 08/08/2022 CT. No new or progressive findings. Upper abdomen: Small hiatal hernia. Simple 1.0 cm liver dome cyst. Cholecystectomy. Musculoskeletal: No aggressive appearing focal osseous lesions. Moderate thoracic spondylosis. IMPRESSION: 1. Mild patchy air trapping in both lungs, indicative of small airways disease, unchanged. 2. Mild patchy peripheral reticulation and ground-glass opacity at the lung bases without significant bronchiectasis or honeycombing. These findings have improved since 08/08/2022 chest CT, favoring bland mild postinfectious/postinflammatory scarring, with a mild interstitial lung disease such as NSIP not entirely excluded but less favored. 3. One vessel coronary atherosclerosis. 4. Small hiatal hernia. 5.  Aortic Atherosclerosis (ICD10-I70.0). Electronically Signed   By: Selinda DELENA Blue M.D.   On: 05/17/2023 17:11       Assessment & Plan:  Mild intermittent  asthma without complication Assessment & Plan: Dr Tamea - 11/25/23 - arnuity, xopenex  inhaler.    Anemia, unspecified type Assessment & Plan: Last hgb improved. Was decreased in hospital. Follow cbc.   Orders: -     CBC with Differential/Platelet; Future  Primary hypertension Assessment & Plan: Blood pressure medication was held in hospital. Was instructed to restart at discharge. Blood pressure as outlined. Follow pressures. Follow metabolic panel.   Orders: -     Basic metabolic panel with GFR; Future  Hypercholesterolemia Assessment & Plan: Continue pravastatin .  Low-cholesterol diet and exercise.  Follow lipid panel.  Lab Results  Component Value Date   CHOL 142 12/26/2023   HDL 47.50 12/26/2023   LDLCALC 80 12/26/2023   TRIG 71.0 12/26/2023   CHOLHDL 3 12/26/2023     Orders: -     Hepatic function panel; Future -     Lipid panel; Future  Type 2 diabetes mellitus with hyperglycemia, without long-term current use of insulin (HCC) Assessment & Plan: Low-carb diet and exercise.  On no medication. Follow met b and A1c.  Lab Results  Component Value Date   HGBA1C 6.0 12/26/2023    Orders: -     Hemoglobin A1c; Future  Persistent atrial fibrillation [facility]) Assessment & Plan: Status post cardioversion 02/26/2023.  Echocardiogram as outlined.  Continue Eliquis  and metoprolol .   Had f/u with cardiology 02/28/24 - f/u afib. Continue amiodarone . Consider cardioversion/ablation. Discussed.   Other orders -     Escitalopram  Oxalate; Take 1 tablet (20 mg total) by mouth daily.  Dispense: 90 tablet; Refill: 1 -     Pravastatin  Sodium; TAKE ONE TABLET EVERY MONDAY, WEDNESDAY AND FRIDAY  Dispense: 39 tablet; Refill: 1 -  Metoprolol  Succinate ER; Take 1 tablet (25 mg total) by mouth 2 (two) times daily.  Dispense: 180 tablet; Refill: 3     Allena Hamilton, MD "

## 2024-03-08 ENCOUNTER — Encounter: Payer: Self-pay | Admitting: Internal Medicine

## 2024-03-08 NOTE — Assessment & Plan Note (Signed)
 Low-carb diet and exercise.  On no medication. Follow met b and A1c.  Lab Results  Component Value Date   HGBA1C 6.0 12/26/2023

## 2024-03-08 NOTE — Assessment & Plan Note (Signed)
 Blood pressure medication was held in hospital. Was instructed to restart at discharge. Blood pressure as outlined. Follow pressures. Follow metabolic panel.

## 2024-03-08 NOTE — Assessment & Plan Note (Signed)
 Last hgb improved. Was decreased in hospital. Follow cbc.

## 2024-03-08 NOTE — Assessment & Plan Note (Signed)
 Continue pravastatin .  Low-cholesterol diet and exercise.  Follow lipid panel.  Lab Results  Component Value Date   CHOL 142 12/26/2023   HDL 47.50 12/26/2023   LDLCALC 80 12/26/2023   TRIG 71.0 12/26/2023   CHOLHDL 3 12/26/2023

## 2024-03-08 NOTE — Assessment & Plan Note (Signed)
 Status post cardioversion 02/26/2023.  Echocardiogram as outlined.  Continue Eliquis  and metoprolol .   Had f/u with cardiology 02/28/24 - f/u afib. Continue amiodarone . Consider cardioversion/ablation. Discussed.

## 2024-03-08 NOTE — Assessment & Plan Note (Signed)
 Dr Tamea - 11/25/23 - arnuity, xopenex  inhaler.

## 2024-03-09 ENCOUNTER — Telehealth: Payer: Self-pay | Admitting: Student in an Organized Health Care Education/Training Program

## 2024-03-09 NOTE — Telephone Encounter (Signed)
 Ms. Stare contacted me as she is interested in pursuing ablation.  We discussed that with severe left atrial enlargement I do think that we can significantly reduce her AF burden but likely would not eliminate it and she may still require rhythm medications to maintain NSR ongoing.  She is tolerating OAC without issue and has had no recent bleeding events.  She will continue on amiodarone  is currently in rate controlled AF.  We looked at dates and she would like to proceed with ablation on 03/24/2024.  Discussed treatment options today for AF including antiarrhythmic drug therapy and ablation. Discussed risks, recovery and likelihood of success with each treatment strategy. Risk, benefits, and alternatives to EP study and ablation for afib were discussed. These risks include but are not limited to stroke, bleeding, vascular damage, tamponade, perforation, damage to the esophagus, lungs, phrenic nerve and other structures, worsening renal function, coronary vasospasm and death.  Discussed potential need for repeat ablation procedures and antiarrhythmic drugs after an initial ablation. The patient understands these risk and wishes to proceed.  We will therefore proceed with catheter ablation. Carto, ICE, anesthesia are requested for the procedure.   Pre-procedure details: Procedure date: 03/24/24 Carto/ICE/GA-anesthesia, no CT scan pre procedure  Hold apixaban  and all medications the morning of the procedure Take apixaban  and all medications the night prior

## 2024-03-10 ENCOUNTER — Telehealth: Payer: Self-pay

## 2024-03-10 DIAGNOSIS — I4819 Other persistent atrial fibrillation: Secondary | ICD-10-CM

## 2024-03-10 NOTE — Telephone Encounter (Signed)
 Pt scheduled for Afib Ablation with Dr. Almetta on 2/10 at 9:30 am - she is aware if there is no 1st case she could be bumped up earlier.   She will get labs done at [facility] office next week. I sent instruction letter via MyChart - pt will call with any questions.

## 2024-03-10 NOTE — Telephone Encounter (Signed)
-----   Message from Donnice Primus, MD sent at 03/09/2024  5:26 PM EST ----- Regarding: Schedule ablation 03/24/24 Could we schedule Lori Guzman for 03/24/24 for AF ablation?  Pre-procedure details: Procedure date: 03/24/24.  Carto/ICE/GA-anesthesia, no CT scan pre procedure  Hold apixaban  and all medications the morning of the procedure Take apixaban  and all medications the night prior   Lori Guzman,  She is scheduled to see you on 03/23/24 for f/u post cardioversion. She came back with pers AF. Thought may be worth rescheduling her clinic f/u with you until she is further out from ablation.   Thanks,  [employer]

## 2024-03-17 ENCOUNTER — Other Ambulatory Visit
Admission: RE | Admit: 2024-03-17 | Discharge: 2024-03-17 | Disposition: A | Source: Home / Self Care | Attending: Student in an Organized Health Care Education/Training Program | Admitting: Student in an Organized Health Care Education/Training Program

## 2024-03-17 LAB — BASIC METABOLIC PANEL WITH GFR
Anion gap: 11 (ref 5–15)
BUN: 19 mg/dL (ref 8–23)
CO2: 28 mmol/L (ref 22–32)
Calcium: 9.3 mg/dL (ref 8.9–10.3)
Chloride: 103 mmol/L (ref 98–111)
Creatinine, Ser: 0.94 mg/dL (ref 0.44–1.00)
GFR, Estimated: >60 mL/min
Glucose, Bld: 104 mg/dL — ABNORMAL HIGH (ref 70–99)
Potassium: 4.7 mmol/L (ref 3.5–5.1)
Sodium: 142 mmol/L (ref 135–145)

## 2024-03-17 LAB — CBC
HCT: 43.2 % (ref 36.0–46.0)
Hemoglobin: 14 g/dL (ref 12.0–15.0)
MCH: 31 pg (ref 26.0–34.0)
MCHC: 32.4 g/dL (ref 30.0–36.0)
MCV: 95.8 fL (ref 80.0–100.0)
Platelets: 254 10*3/uL (ref 150–400)
RBC: 4.51 MIL/uL (ref 3.87–5.11)
RDW: 13.3 % (ref 11.5–15.5)
WBC: 7.8 10*3/uL (ref 4.0–10.5)
nRBC: 0 % (ref 0.0–0.2)

## 2024-03-18 ENCOUNTER — Telehealth (HOSPITAL_COMMUNITY): Payer: Self-pay

## 2024-03-18 ENCOUNTER — Encounter: Payer: Self-pay | Admitting: Internal Medicine

## 2024-03-18 NOTE — Telephone Encounter (Signed)
 Spoke with patient to discuss upcoming procedure.   CT: not required.  Labs: completed.   Any recent signs of acute illness or been started on antibiotics? Reports she developed a sore throat a couple of days ago with nasal drainage at night when lying down. She denies any associated symptoms such as fever, cough, runny nose, nasal or chest congestion.  Any new medications started since pre-op visit? No  Any missed doses of blood thinner?  No  Advised patient to continue taking Eliquis  (Apixaban ) twice daily without missing any doses. Any medications to hold?  routine Medication instructions:  On the morning of your procedure DO NOT take any medication., including Eliquis  (Apixaban ) or the procedure may be rescheduled. Nothing to eat or drink after midnight prior to your procedure.  Confirmed patient is scheduled for Atrial Fibrillation Ablation on Tuesday, February 10 with Dr. Almetta. Instructed patient to arrive at the Main Entrance A at [facility]: [address] [place], [place] [address] and check in at Admitting at 7:30 AM.   Plan to go home the same day, you will only stay overnight if medically necessary. You MUST have a responsible adult to drive you home and MUST be with you the first 24 hours after you arrive home or your procedure could be cancelled.  Informed patient a nurse may call a day before the procedure to confirm arrival time and ensure instructions are followed.  Patient verbalized understanding to all instructions provided and agreed to proceed with procedure.   Advised patient to contact RN Navigator at [phone], to inform of any new medications started after call or concerns prior to procedure.

## 2024-03-19 NOTE — Telephone Encounter (Signed)
 Is she having any other symptoms? If sick and is scheduled for upcoming procedure, needs to be seen to confirm diagnosis and to help determine appropriate treatment.

## 2024-03-20 ENCOUNTER — Ambulatory Visit

## 2024-03-20 VITALS — BP 132/82 | HR 96 | Temp 98.0°F | Ht 64.0 in | Wt 180.8 lb

## 2024-03-20 DIAGNOSIS — J069 Acute upper respiratory infection, unspecified: Secondary | ICD-10-CM

## 2024-03-20 NOTE — Telephone Encounter (Signed)
 Spoke with the patient and advised that her procedure would need to be rescheduled due to illness. She has  been moved to 2/18. If symptoms do not improve over the next week she will call us  back.

## 2024-03-20 NOTE — Patient Instructions (Addendum)
 Coricidin HBP decongestant cough and cold for high blood pressure patient's. Also may use Mucinex Cough and chest congestant   Recomed Saline nasal Spray as needed.  Please follow all directions and preprocedure preparations  given to you by your cardiologist for your cardiac ablation on Tuesday 24 March 2024.

## 2024-03-20 NOTE — Progress Notes (Signed)
 "  Established Patient Office Visit  Subjective   Patient ID: Lori Guzman, female    DOB: 1940-12-02  Age: 84 y.o. MRN: [id number]  Chief Complaint  Patient presents with   Cough    Alert oriented and pleasant patient presents with cough and nasal congestion x 1 week.  Patient states congestion and cough is worse in the morning but mostly clears for the rest of the day.  Patient reports throat feeling scratchy.  Patient asking what is safe to take prior to cardiac ablation for atrial fibrillation procedure by Dr. Fernando on 24 March 2024.  Denies any pain.  Denies other concerns at this time.  Cough Associated symptoms include a sore throat. Pertinent negatives include no chills, ear pain, fever, hemoptysis, myalgias, shortness of breath or wheezing.    {History: 23778}  Review of Systems  Constitutional:  Negative for chills, fever and malaise/fatigue.  HENT:  Positive for congestion, sinus pain and sore throat. Negative for ear discharge, ear pain, hearing loss, nosebleeds and tinnitus.        Reports sinus congestion, pressure and sore throat.  Eyes: Negative.   Respiratory:  Positive for cough. Negative for hemoptysis, sputum production, shortness of breath, wheezing and stridor.        Cough only in the mornings with minimal to no coughing throughout the rest of the day. Denies shortness of breath or wheezing.  Cardiovascular: Negative.        History of A-fib.  Patient is scheduled for cardiac ablation on 24 March 2024.  Patient takes amiodarone  and metoprolol  for rate and rhythm control  Gastrointestinal: Negative.   Genitourinary: Negative.   Musculoskeletal: Negative.  Negative for myalgias.  Skin: Negative.   Neurological: Negative.   Endo/Heme/Allergies: Negative.        Patient on Eliquis   Psychiatric/Behavioral:  Negative for suicidal ideas.       Objective:     BP 132/82   Pulse 96   Temp 98 F (36.7 C) (Oral)   Ht 5' 4 (1.626 m)   Wt 180 lb  12.8 oz (82 kg)   LMP 02/29/1992   SpO2 97%   BMI 31.03 kg/m    Physical Exam Constitutional:      Appearance: Normal appearance.  HENT:     Head: Normocephalic and atraumatic.     Right Ear: Tympanic membrane, ear canal and external ear normal.     Left Ear: Tympanic membrane, ear canal and external ear normal.     Nose: Nose normal.     Comments: Negative turbinate erythema or edema.  Negative rhinorrhea noted during visit    Mouth/Throat:     Mouth: Mucous membranes are moist.     Pharynx: Posterior oropharyngeal erythema present. No oropharyngeal exudate.     Comments: Mild erythema in pharynx.  Negative exudate noted. Eyes:     Extraocular Movements: Extraocular movements intact.     Conjunctiva/sclera: Conjunctivae normal.     Pupils: Pupils are equal, round, and reactive to light.  Cardiovascular:     Rate and Rhythm: Rhythm irregular.     Pulses: Normal pulses.     Heart sounds: Normal heart sounds.     Comments: Irregular rhythm consistent with history of atrial fibrillation Pulmonary:     Effort: Pulmonary effort is normal.  Abdominal:     General: Abdomen is flat.     Palpations: Abdomen is soft.  Musculoskeletal:        General: Normal range of motion.  Cervical back: Normal range of motion and neck supple. No rigidity or tenderness.  Lymphadenopathy:     Cervical: No cervical adenopathy.  Skin:    General: Skin is warm and dry.     Capillary Refill: Capillary refill takes 2 to 3 seconds.  Neurological:     General: No focal deficit present.     Mental Status: She is alert and oriented to person, place, and time. Mental status is at baseline.  Psychiatric:        Mood and Affect: Mood normal.        Behavior: Behavior normal.        Thought Content: Thought content normal.        Judgment: Judgment normal.     Comments: Patient mildly anxious and apprehensive about upcoming procedure.      No results found for any visits on 03/20/24.    The  ASCVD Risk score (Arnett DK, et al., 2019) failed to calculate for the following reasons:   The 2019 ASCVD risk score is only valid for ages 56 to 29   * - Cholesterol units were assumed    Assessment & Plan:   Assessment & Plan Upper respiratory tract infection, unspecified type Viral versus bacterial upper respiratory infection.  Given time frame per patient and exam strong biased towards viral upper respiratory infection at this time. Patient with questions about OTC medication safety related to upcoming cardiac ablation.   -Recommend adding saline nasal spray to help with symptom management.  -Recommended Coricidin HBP cough and cold for hypertensive patients with the alternate of Mucinex high blood pressure cough and cold.    -Patient to continue existing prescription for Xopenex  PRN (inhaler for  allergies) and Flonase daily.  -Patient patient gave teach back of preprocedure modifications to current medication regimen.  Includes cessation the night before of amiodarone , Micardis , metoprolol  and Eliquis .      Return if symptoms worsen or fail to improve. Pt scheduled for Cardiac Ablation 24 Mar 2024.Lori Guzman    Lori JONETTA Huh, NP [name]  "

## 2024-03-23 ENCOUNTER — Ambulatory Visit: Admitting: Cardiovascular Disease

## 2024-03-24 ENCOUNTER — Ambulatory Visit (HOSPITAL_COMMUNITY): Admit: 2024-03-24 | Admitting: Student in an Organized Health Care Education/Training Program

## 2024-04-01 ENCOUNTER — Encounter (HOSPITAL_COMMUNITY): Admission: RE | Payer: Self-pay | Source: Home / Self Care

## 2024-04-14 ENCOUNTER — Ambulatory Visit: Admitting: Cardiovascular Disease

## 2024-04-21 ENCOUNTER — Ambulatory Visit: Admitting: Cardiology

## 2024-04-29 ENCOUNTER — Other Ambulatory Visit

## 2024-05-01 ENCOUNTER — Ambulatory Visit: Admitting: Internal Medicine
